# Patient Record
Sex: Female | Born: 1965 | Race: White | Hispanic: No | Marital: Married
Health system: Midwestern US, Community
[De-identification: ages and names within clinical notes are randomized; demographics above are authoritative.]

## PROBLEM LIST (undated history)

## (undated) DIAGNOSIS — G40219 Localization-related (focal) (partial) symptomatic epilepsy and epileptic syndromes with complex partial seizures, intractable, without status epilepticus: Secondary | ICD-10-CM

## (undated) DIAGNOSIS — L821 Other seborrheic keratosis: Secondary | ICD-10-CM

## (undated) DIAGNOSIS — Z1211 Encounter for screening for malignant neoplasm of colon: Secondary | ICD-10-CM

## (undated) DIAGNOSIS — N3090 Cystitis, unspecified without hematuria: Secondary | ICD-10-CM

## (undated) DIAGNOSIS — Z0000 Encounter for general adult medical examination without abnormal findings: Secondary | ICD-10-CM

## (undated) DIAGNOSIS — G40909 Epilepsy, unspecified, not intractable, without status epilepticus: Secondary | ICD-10-CM

## (undated) DIAGNOSIS — D649 Anemia, unspecified: Secondary | ICD-10-CM

## (undated) DIAGNOSIS — Z79899 Other long term (current) drug therapy: Secondary | ICD-10-CM

## (undated) DIAGNOSIS — M5117 Intervertebral disc disorders with radiculopathy, lumbosacral region: Secondary | ICD-10-CM

## (undated) DIAGNOSIS — E119 Type 2 diabetes mellitus without complications: Secondary | ICD-10-CM

## (undated) DIAGNOSIS — I10 Essential (primary) hypertension: Secondary | ICD-10-CM

## (undated) HISTORY — PX: FRACTURE SURGERY: SHX138

---

## 1998-11-15 ENCOUNTER — Ambulatory Visit (HOSPITAL_BASED_OUTPATIENT_CLINIC_OR_DEPARTMENT_OTHER): Admission: RE | Admit: 1998-11-15 | Discharge: 1998-11-15 | Payer: Self-pay | Admitting: Ophthalmology

## 1999-09-12 ENCOUNTER — Emergency Department (HOSPITAL_COMMUNITY): Admission: EM | Admit: 1999-09-12 | Discharge: 1999-09-12 | Payer: Self-pay | Admitting: Emergency Medicine

## 1999-10-18 ENCOUNTER — Encounter: Admission: RE | Admit: 1999-10-18 | Discharge: 1999-10-18 | Payer: Self-pay | Admitting: Obstetrics & Gynecology

## 2001-03-18 ENCOUNTER — Encounter: Payer: Self-pay | Admitting: Emergency Medicine

## 2001-03-18 ENCOUNTER — Emergency Department (HOSPITAL_COMMUNITY): Admission: EM | Admit: 2001-03-18 | Discharge: 2001-03-18 | Payer: Self-pay | Admitting: Emergency Medicine

## 2004-01-28 ENCOUNTER — Other Ambulatory Visit: Admission: RE | Admit: 2004-01-28 | Discharge: 2004-01-28 | Payer: Self-pay | Admitting: Obstetrics and Gynecology

## 2004-01-29 ENCOUNTER — Encounter: Admission: RE | Admit: 2004-01-29 | Discharge: 2004-01-29 | Payer: Self-pay | Admitting: Obstetrics and Gynecology

## 2004-02-05 ENCOUNTER — Inpatient Hospital Stay (HOSPITAL_COMMUNITY): Admission: AD | Admit: 2004-02-05 | Discharge: 2004-02-05 | Payer: Self-pay | Admitting: Obstetrics and Gynecology

## 2004-07-02 ENCOUNTER — Encounter: Admission: RE | Admit: 2004-07-02 | Discharge: 2004-07-02 | Payer: Self-pay | Admitting: Orthopaedic Surgery

## 2009-09-03 ENCOUNTER — Other Ambulatory Visit: Admission: RE | Admit: 2009-09-03 | Discharge: 2009-09-03 | Payer: Self-pay | Admitting: Family Medicine

## 2010-06-26 ENCOUNTER — Encounter: Payer: Self-pay | Admitting: Family Medicine

## 2011-11-23 NOTE — Progress Notes (Signed)
HISTORY OF PRESENT ILLNESS  Natasha Herman is a 46 y.o. female with a history of epilepsy that presents for establishment of care.     HPI  Natasha Herman is a 46 year old female that presents for establishment of care. The patient has a history of epilepsy, diagnosed in 2009. The patient did not have any acute complaints.     The patient seizures are currently managed by a Neurologist in [place], [place]. The patient states that within the next six months she would like to be referred to a neurologist within the [place], [place] area. The patient was given Lexapro because she was having symptoms of depression when diagnosed with epilepsy. Today the patient states she does not have any feelings of sadness and depression. The patient stats that she feels that the medication is working well. Pt is currently denies any homicidal or suicidal ideations, gestures, or plans.      The patient states that she has not had complete lab work for the last four years. The patient is up-to-date on her Pap Smears and Mammograms.     Patient does states that she has not had a period for the last 8 months and has noted some symptoms of hot flashes and night sweats. The patient is currently tolerating them well     Review of Systems   Constitutional: Positive for malaise/fatigue. Negative for fever, chills, weight loss and diaphoresis.        Fatigue associated post-seizure.    HENT: Positive for tinnitus. Negative for hearing loss, ear pain, congestion, sore throat and neck pain.         Tinnitis associated to prior to onset of seizure   Eyes: Negative for blurred vision, double vision, photophobia and pain.   Respiratory: Negative for cough, sputum production, shortness of breath and wheezing.    Cardiovascular: Negative for chest pain, palpitations and orthopnea.   Gastrointestinal: Negative for heartburn, nausea, vomiting, abdominal pain, diarrhea, constipation, blood in stool and melena.   Genitourinary: Negative for dysuria and urgency.    Musculoskeletal: Negative for myalgias, back pain and joint pain.   Skin: Negative for rash.   Neurological: Positive for dizziness and seizures. Negative for tingling, tremors, sensory change and headaches.   Endo/Heme/Allergies: Positive for environmental allergies. Bruises/bleeds easily.        Easy bleeding due to Aspirin   Psychiatric/Behavioral: Negative for depression. The patient is not nervous/anxious.      Past Medical History   Diagnosis Date   ??? Seizures    ??? Depression      Past Surgical History   Procedure Date   ??? Pr breast surgery procedure unlisted      lumpectomy-2003   ??? Hx heent      wisdom teeth -1995   ??? Hx cyst removal      R. Bartolin Cyst-2001     Family History   Problem Relation Age of Onset   ??? Cancer Mother      Breast   ??? Diabetes Mother    ??? Diabetes Sister    ??? Hypertension Sister    ??? Hypertension Mother    ??? Diabetes Maternal Grandmother    ??? Parkinsonism Father      History     Social History   ??? Marital Status: MARRIED     Spouse Name: N/A     Number of Children: 1   ??? 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 Smoking status: Never Smoker    ??? Smokeless tobacco: Never Used   ??? Alcohol Use: No   ??? Drug Use: No   ??? Sexually Active: No     Other Topics Concern   ??? Not on file     Social History Narrative   ??? No narrative on file     Allergies   Allergen Reactions   ??? Dilantin (Phenytoin Sodium Extended) Rash     No current outpatient prescriptions on file prior to visit.       Filed Vitals:    11/22/11 1312   BP: 110/70   Pulse: 70   Temp: 97.4 ??F (36.3 ??C)   Resp: 14   SpO2: 99%   .  There is no height or weight on file to calculate BMI.        Physical Exam   Vitals reviewed.  Constitutional: She is oriented to person, place, and time. Vital signs are normal. She appears well-developed and well-nourished. She is cooperative.   HENT:   Head: Normocephalic and atraumatic.   Right Ear: Hearing, tympanic membrane, external ear and  ear canal normal.   Left Ear: Hearing, tympanic membrane, external ear and ear canal normal.   Nose: Right sinus exhibits no maxillary sinus tenderness and no frontal sinus tenderness. Left sinus exhibits no maxillary sinus tenderness and no frontal sinus tenderness.   Mouth/Throat: Uvula is midline, oropharynx is clear and moist and mucous membranes are normal.   Eyes: Conjunctivae, EOM and lids are normal. Pupils are equal, round, and reactive to light.   Neck: Trachea normal. Neck supple. No thyromegaly present.   Cardiovascular: Normal rate, regular rhythm, S1 normal, S2 normal and normal heart sounds.    Pulmonary/Chest: Effort normal and breath sounds normal.   Abdominal: Soft. Normal appearance and bowel sounds are normal.   Musculoskeletal: Normal range of motion.   Lymphadenopathy:     She has no cervical adenopathy.   Neurological: She is alert and oriented to person, place, and time. She has normal strength and normal reflexes. No cranial nerve deficit.   Skin: Skin is warm and intact. Bruising noted. Nails show no clubbing.   Psychiatric: She has a normal mood and affect. Her speech is normal and behavior is normal. Thought content normal.       ASSESSMENT and PLAN      1. Epilepsy  Lamotrigine 100 mg TR24, levETIRAcetam (KEPPRA) 500 mg XR tablet, VIMPAT 150 mg Tab tablet, escitalopram (LEXAPRO) 10 mg tablet, CBC WITH AUTOMATED DIFF, METABOLIC PANEL, COMPREHENSIVE, LIPID PANEL, TSH, 3RD GENERATION   2. Depression  Continue current therapy      Encounter Diagnoses   Name Primary?   ??? Epilepsy Yes   ??? Depression      Follow-up Disposition: Will return for fasting labs.   reviewed diet and exercise      I have discussed the above plan with the patient. Patient has also been given instructions and information on her conditions.  Patient is aware of all possible side effects and agrees with the above mentioned plan.       Donnella Bi, DO

## 2011-11-24 LAB — METABOLIC PANEL, COMPREHENSIVE
A-G Ratio: 1.5 (ref 1.2–2.2)
ALT (SGPT): 17 U/L (ref 12–78)
AST (SGOT): 14 U/L — ABNORMAL LOW (ref 15–37)
Albumin: 4.5 g/dL (ref 3.4–5.0)
Alk. phosphatase: 86 U/L (ref 50–136)
Anion gap: 9 mmol/L (ref 6–15)
BUN/Creatinine ratio: 30 — ABNORMAL HIGH (ref 7–25)
BUN: 18 MG/DL (ref 7–18)
Bilirubin, total: 0.5 MG/DL (ref <1.1)
CO2: 31 MMOL/L (ref 21–32)
Calcium: 9.4 MG/DL (ref 8.5–10.1)
Chloride: 102 MMOL/L (ref 98–107)
Creatinine: 0.6 MG/DL (ref 0.60–1.30)
GFR est AA: >60 mL/min/{1.73_m2} (ref >60)
GFR est non-AA: >60 mL/min/{1.73_m2} (ref >60)
Globulin: 3 g/dL (ref 2.4–3.5)
Glucose: 84 MG/DL (ref 70–110)
Potassium: 4 MMOL/L (ref 3.5–5.3)
Protein, total: 7.5 g/dL (ref 6.4–8.2)
Sodium: 142 MMOL/L (ref 136–145)

## 2011-11-24 LAB — CBC WITH AUTOMATED DIFF
ABS. BASOPHILS: 0 10*3/uL (ref 0.0–0.1)
ABS. EOSINOPHILS: 0.1 10*3/uL (ref 0.0–0.5)
ABS. LYMPHOCYTES: 1.9 10*3/uL (ref 0.8–3.5)
ABS. MONOCYTES: 0.3 10*3/uL — ABNORMAL LOW (ref 0.8–3.5)
ABS. NEUTROPHILS: 2.2 10*3/uL (ref 1.5–8.0)
BASOPHILS: 1 % (ref 0–2)
EOSINOPHILS: 2 % (ref 0–5)
HCT: 38.8 % — ABNORMAL LOW (ref 41–53)
HGB: 13 g/dL (ref 12.0–16.0)
LYMPHOCYTES: 42 % (ref 19–48)
MCH: 29 PG (ref 27–31)
MCHC: 33.5 g/dL (ref 31–37)
MCV: 86.6 FL (ref 80–100)
MONOCYTES: 7 % (ref 3–9)
MPV: 10.6 FL — ABNORMAL HIGH (ref 5.9–10.3)
NEUTROPHILS: 48 % (ref 40–74)
PLATELET: 192 10*3/uL (ref 130–400)
RBC: 4.48 M/uL (ref 4.2–5.4)
RDW: 12.3 % (ref 11.5–14.5)
WBC: 4.5 10*3/uL (ref 4.5–10.8)

## 2011-11-24 LAB — LIPID PANEL
Cholesterol, total: 166 MG/DL (ref <200)
HDL Cholesterol: 76 MG/DL (ref 32–96)
LDL, calculated: 79.28 MG/DL (ref <130)
Triglyceride: 67 MG/DL (ref <150)
VLDL, calculated: 13.4 MG/DL (ref 5–32)

## 2011-11-24 LAB — TSH 3RD GENERATION: TSH: 1.72 u[IU]/mL (ref 0.35–3.74)

## 2012-01-15 ENCOUNTER — Other Ambulatory Visit: Payer: Self-pay | Admitting: Internal Medicine

## 2012-01-15 DIAGNOSIS — Z1231 Encounter for screening mammogram for malignant neoplasm of breast: Secondary | ICD-10-CM

## 2012-01-29 ENCOUNTER — Ambulatory Visit
Admission: RE | Admit: 2012-01-29 | Discharge: 2012-01-29 | Disposition: A | Discharge status: Home / Self Care | Payer: Private Health Insurance - Indemnity | Source: Ambulatory Visit | Attending: Internal Medicine | Admitting: Internal Medicine

## 2012-01-29 DIAGNOSIS — Z1231 Encounter for screening mammogram for malignant neoplasm of breast: Secondary | ICD-10-CM

## 2013-06-01 ENCOUNTER — Encounter (HOSPITAL_COMMUNITY): Payer: Self-pay | Admitting: Emergency Medicine

## 2013-06-01 ENCOUNTER — Emergency Department (INDEPENDENT_AMBULATORY_CARE_PROVIDER_SITE_OTHER)
Admission: EM | Admit: 2013-06-01 | Discharge: 2013-06-01 | Disposition: A | Discharge status: Home / Self Care | Payer: 59 | Source: Home / Self Care | Attending: Family Medicine | Admitting: Family Medicine

## 2013-06-01 DIAGNOSIS — J40 Bronchitis, not specified as acute or chronic: Secondary | ICD-10-CM

## 2013-06-01 DIAGNOSIS — F17200 Nicotine dependence, unspecified, uncomplicated: Secondary | ICD-10-CM

## 2013-06-01 DIAGNOSIS — IMO0001 Reserved for inherently not codable concepts without codable children: Secondary | ICD-10-CM

## 2013-06-01 MED ORDER — ALBUTEROL SULFATE HFA 108 (90 BASE) MCG/ACT IN AERS: 2.0000 | INHALATION_SPRAY | Freq: Four times a day (QID) | RESPIRATORY_TRACT | Status: DC | PRN

## 2013-06-01 MED ORDER — IPRATROPIUM BROMIDE 0.06 % NA SOLN: 2.0000 | Freq: Four times a day (QID) | NASAL | Status: DC

## 2013-06-01 MED ORDER — PREDNISONE 50 MG PO TABS: 50.0000 mg | ORAL_TABLET | Freq: Every day | ORAL | Status: DC

## 2013-06-01 MED ORDER — IPRATROPIUM BROMIDE 0.02 % IN SOLN
0.5000 mg | Freq: Once | RESPIRATORY_TRACT | Status: AC
Start: 2013-06-01 — End: 2013-06-01
  Administered 2013-06-01: 0.5 mg via RESPIRATORY_TRACT

## 2013-06-01 MED ORDER — SODIUM CHLORIDE 0.9 % IJ SOLN
INTRAMUSCULAR | Status: AC
  Filled 2013-06-01: qty 3

## 2013-06-01 MED ORDER — ALBUTEROL SULFATE (5 MG/ML) 0.5% IN NEBU
5.0000 mg | INHALATION_SOLUTION | Freq: Once | RESPIRATORY_TRACT | Status: AC
  Administered 2013-06-01: 5 mg via RESPIRATORY_TRACT

## 2013-06-01 MED ORDER — GUAIFENESIN-CODEINE 100-10 MG/5ML PO SOLN: 5.0000 mL | Freq: Every evening | ORAL | Status: DC | PRN

## 2013-06-01 MED ORDER — ALBUTEROL SULFATE (5 MG/ML) 0.5% IN NEBU
INHALATION_SOLUTION | RESPIRATORY_TRACT | Status: AC
  Filled 2013-06-01: qty 1

## 2013-06-01 MED ORDER — IPRATROPIUM BROMIDE 0.02 % IN SOLN
RESPIRATORY_TRACT | Status: AC
  Filled 2013-06-01: qty 2.5

## 2013-06-01 NOTE — ED Provider Notes (Signed)
Judith Pollard is a 47 y.o. female who presents to Urgent Care today for 2-3 weeks of cough productive of clear sputum runny nose subjective shortness of breath and fatigue. The patient denies any nausea vomiting or diarrhea. She's tried multiple over-the-counter medications which have not helped much. She does smoke but has never been diagnosed with COPD. She feels well otherwise.   History reviewed. No pertinent past medical history. History  Substance Use Topics  . Smoking status: Not on file  . Smokeless tobacco: Not on file  . Alcohol Use: Not on file   ROS as above Medications reviewed. Current Facility-Administered Medications  Medication Dose Route Frequency Provider Last Rate Last Dose  . ipratropium (ATROVENT) nebulizer solution 0.5 mg  0.5 mg Nebulization Once Rodolph Bong, MD       And  . albuterol (PROVENTIL) (5 MG/ML) 0.5% nebulizer solution 5 mg  5 mg Nebulization Once Rodolph Bong, MD       No current outpatient prescriptions on file.    Exam:  BP 160/78  Pulse 87  Temp(Src) 98.3 F (36.8 C) (Oral)  Resp 19  SpO2 99%  LMP 05/19/2013 Gen: Well NAD HEENT: EOMI,  MMM Lungs: Normal work of breathing. Coarse breath sounds bilaterally. Lung exam improved following nebulizer treatment. Heart: RRR no MRG Abd: NABS, Soft. NT, ND Exts: Non edematous BL  LE, warm and well perfused.   Patient was given a DuoNeb nebulizer treatment and had considerable improvement in symptoms   Assessment and Plan: 47 y.o. female with bronchitis versus possible COPD exacerbation. Recommend smoking cessation. Patient will treat with albuterol inhaler, prednisone, Atrovent nasal spray, and codeine containing cough medication. Recommend followup with primary care provider for smoking cessation counseling. Discussed warning signs or symptoms. Please see discharge instructions. Patient expresses understanding.      Rodolph Bong, MD 06/01/13 1049

## 2013-06-01 NOTE — ED Notes (Signed)
C/o cold sx States she has cough, congestion, and sneezing otc medication taking but no relief.

## 2013-09-17 NOTE — Telephone Encounter (Signed)
Patient said she doesn't want to see Dr. Leilani Merl any longer and would like to see Dr. Manuella Ghazi in [facility]).  They need a referral for this.  Also want to know she needs to come in to see you and get blood work?

## 2013-09-19 NOTE — Telephone Encounter (Signed)
Dr. Feliberto Gottron receptionist said they would not take referral without a more recent office visit than 2 years ago. Notified pt.

## 2013-09-29 NOTE — Telephone Encounter (Signed)
Natasha Bers, DO (You)Natasha Audie Pinto, LPN  9 days ago   Please inform pt we tried to honor her request but as told before they will not accept a referral from a doctor that the patient has not seen in two years      Natasha Bers, DO (Routing comment)    [name], [place], DO (You)  10 days ago     Dr. Feliberto Gottron receptionist said they would not take referral without a more recent office visit than 2 years ago. Notified pt.     Natasha Bers, DO (You)Natasha Audie Pinto, LPN and Tiffany L Depriest, LPN  11 days ago   Call Dr. Renae Gloss office, ask if they are willing to accept a referral for a pt that I havent seen in almost 2 years. Let them know that Wife and husband want referral done ASAP and are not willing to wait until her follow-up visit (which is may 4) for me to place it    They states that they have fired their Neurologist at [facility] and want referral NOW.     Let them know that we tried to explain to them that referrals are placed after office visits and that they needed to follow-up with me first but they believed this was not appropriate for their care. (Routing comment)    [place] Pink, DO (You)  12 days ago   Scheduled her an apt for May 4, but wants you to go ahead and refer her to the seizure doc. (Routing comment)    Natasha Bers, DO (You)[name]  12 days ago   Needs appointment hasnt been seen since 2013 (Routing comment)    [place] Pink, DO (You)  12 days ago     Patient said she doesn't want to see Dr. Leilani Merl any longer and would like to see Dr. Manuella Ghazi in [facility] They need a referral for this. Also want to know she needs to come in to see you and get blood work?    [place], [phone] L Tackett  12 days ago

## 2013-10-06 LAB — METABOLIC PANEL, COMPREHENSIVE
A-G Ratio: 1.5 (ref 1.2–2.2)
ALT (SGPT): 19 U/L (ref 12–78)
AST (SGOT): 18 U/L (ref 15–37)
Albumin: 4.5 g/dL (ref 3.4–5.0)
Alk. phosphatase: 73 U/L (ref 45–117)
Anion gap: 6 mmol/L (ref 6–15)
BUN/Creatinine ratio: 39 — ABNORMAL HIGH (ref 7–25)
BUN: 27 MG/DL — ABNORMAL HIGH (ref 7–18)
Bilirubin, total: 0.3 MG/DL (ref <1.1)
CO2: 31 mmol/L (ref 21–32)
Calcium: 9.4 MG/DL (ref 8.5–10.1)
Chloride: 105 mmol/L (ref 98–107)
Creatinine: 0.7 MG/DL (ref 0.60–1.30)
GFR est AA: >60 mL/min/{1.73_m2} (ref >60)
GFR est non-AA: >60 mL/min/{1.73_m2} (ref >60)
Globulin: 3.1 g/dL (ref 2.4–3.5)
Glucose: 79 mg/dL (ref 70–110)
Potassium: 4.8 mmol/L (ref 3.5–5.3)
Protein, total: 7.6 g/dL (ref 6.4–8.2)
Sodium: 142 mmol/L (ref 136–145)

## 2013-10-06 LAB — CBC WITH AUTOMATED DIFF
ABS. BASOPHILS: 0 10*3/uL (ref 0.0–0.1)
ABS. EOSINOPHILS: 0.1 10*3/uL (ref 0.0–0.5)
ABS. LYMPHOCYTES: 1.8 10*3/uL (ref 0.8–3.5)
ABS. MONOCYTES: 0.4 10*3/uL — ABNORMAL LOW (ref 0.8–3.5)
ABS. NEUTROPHILS: 3.3 10*3/uL (ref 1.5–8.0)
BASOPHILS: 1 % (ref 0–2)
EOSINOPHILS: 1 % (ref 0–5)
HCT: 37.1 % — ABNORMAL LOW (ref 41–53)
HGB: 12.3 g/dL (ref 12.0–16.0)
LYMPHOCYTES: 32 % (ref 19–48)
MCH: 28.7 PG (ref 27–31)
MCHC: 33.2 g/dL (ref 31–37)
MCV: 86.5 FL (ref 80–100)
MONOCYTES: 8 % (ref 3–9)
MPV: 10.8 FL — ABNORMAL HIGH (ref 5.9–10.3)
NEUTROPHILS: 58 % (ref 40–74)
PLATELET: 218 10*3/uL (ref 130–400)
RBC: 4.29 M/uL (ref 4.2–5.4)
RDW: 12.7 % (ref 11.5–14.5)
WBC: 5.6 10*3/uL (ref 4.5–10.8)

## 2013-10-06 LAB — TSH 3RD GENERATION: TSH: 1.9 u[IU]/mL (ref 0.35–3.74)

## 2013-10-06 NOTE — Patient Instructions (Signed)
[facility]   Office working hours are Monday - Friday 8:00 am to 4:30 pm.   Saturday 9 am to 12 pm for acute illnesses only for established patients.   Office phone number is [phone] and fax is [phone].   For non-emergent medical care and clinical advice during office hours:   1. Call office or   2. Send message or request using MyChart   For non-emergent medical care and clinical advice after office hours:   1. Call [phone] or   2. Send message or request using MyChart   Emergency care can be obtained at the [facility] ER, Urgent Care or calling 911.   Patient Satisfaction Survey   We appreciate you giving us your e-mail address. Please watch for our patient satisfaction survey which you will receive by e-mail. We strive to provide you with the best care possible. We respect all comments and will take comments into consideration to improve our service.   Thank you for your participation.

## 2013-10-06 NOTE — Progress Notes (Signed)
HISTORY OF PRESENT ILLNESS  Natasha Herman is a 48 y.o. female with a history of epilepsy that presents for establishment of care.     HPI  Natasha Herman is a 48 year old female that presents to follow-up care.  Need referral to new neurologist. DX with Seizure disorder in 2009.  Pt states that since L Temporal craniotomy in March 18 2013.  Pt states that since the surgery which was was managed by a Neurologist in [place], [place], seizure activity has doubled. Went from 17 seizures a month to now up to 33 seizures a months. Neurologist is treating her seizure disorder and primary mood disorder. Pt denies any tonic clonic movement, describes it as a disorientation with a post ictal state (very difficult historian as what exactly is involved when she has a seizure)  Pt states that seizures last 1-3 minutes with postictal state being about 30 minutes after the episode. Denies loss of bowel or bladder. Tinnitis  associated to prior to onset of seizure     Review of Systems   Constitutional: Positive for malaise/fatigue. Negative for fever, chills, weight loss and diaphoresis.        Fatigue associated post-seizure.    HENT: Positive for tinnitus. Negative for hearing loss, ear pain, congestion, sore throat and neck pain.         Tinnitis associated to prior to onset of seizure   Eyes: Negative for blurred vision, double vision, photophobia and pain.   Respiratory: Negative for cough, sputum production, shortness of breath and wheezing.    Cardiovascular: Negative for chest pain, palpitations and orthopnea.   Gastrointestinal: Negative for heartburn, nausea, vomiting, abdominal pain, diarrhea, constipation, blood in stool and melena.   Genitourinary: Negative for dysuria and urgency.   Musculoskeletal: Negative for myalgias, back pain and joint pain.   Skin: Negative for rash.   Neurological: Positive for  seizures. Negative for tingling, tremors, sensory change and headaches.   Endo/Heme/Allergies: Positive for  environmental allergies. Bruises/bleeds easily.        Easy bleeding due to Aspirin   Psychiatric/Behavioral: Negative for depression. The patient is not nervous/anxious.      Past Medical History   Diagnosis Date   ??? Seizures ([place])    ??? Depression      Past Surgical History   Procedure Laterality Date   ??? Pr breast surgery procedure unlisted       lumpectomy-2003   ??? Hx heent       wisdom teeth -1995   ??? Hx cyst removal       R. Bartolin Cyst-2001   ??? Hx craniotomy       2014 -- L temperol lobe      Family History   Problem Relation Age of Onset   ??? Cancer Mother      Breast   ??? Diabetes Mother    ??? Diabetes Sister    ??? Hypertension Sister    ??? Hypertension Mother    ??? Diabetes Maternal Grandmother    ??? Parkinsonism Father      History     Social History   ??? Marital Status: MARRIED     Spouse Name: N/A     Number of Children: 1   ??? 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 Smoking status: Never Smoker    ??? Smokeless tobacco: Never Used   ??? Alcohol  Use: No   ??? Drug Use: No   ??? Sexual Activity: No     Other Topics Concern   ??? Not on file     Social History Narrative     Allergies   Allergen Reactions   ??? Aptiom [Eslicarbazepine] Seizures   ??? Dilantin [Phenytoin Sodium Extended] Rash   ??? Fycompa [Perampanel] Other (comments)     PATIENT STATES SHE PASSED OUT     Current Outpatient Prescriptions on File Prior to Visit   Medication Sig Dispense Refill   ??? Lamotrigine 100 mg TR24 100 mg.       ??? levETIRAcetam (KEPPRA) 500 mg XR tablet 1,500 mg two (2) times a day.       ??? VIMPAT 150 mg Tab tablet 150 mg two (2) times a day.       ??? escitalopram (LEXAPRO) 10 mg tablet 10 mg.         No current facility-administered medications on file prior to visit.       Filed Vitals:    10/06/13 1150   BP: 112/64   Pulse: 79   Temp: 97.9 ??F (36.6 ??C)   TempSrc: Temporal   Resp: 18   Height: 6\' 1"  (1.854 m)   Weight: 136 lb (61.689 kg)   SpO2: 98%   .  Body mass index is 17.95  kg/(m^2).        Physical Exam   Vitals reviewed.  Constitutional: She is oriented to person, place, and time. Vital signs are normal. She appears well-developed and well-nourished. She is cooperative.   HENT:   Head: Normocephalic and atraumatic.   Right Ear: Hearing, tympanic membrane, external ear and ear canal normal.   Left Ear: Hearing, tympanic membrane, external ear and ear canal normal.   Nose: Right sinus exhibits no maxillary sinus tenderness and no frontal sinus tenderness. Left sinus exhibits no maxillary sinus tenderness and no frontal sinus tenderness.   Mouth/Throat: Uvula is midline, oropharynx is clear and moist and mucous membranes are normal.   Eyes: Conjunctivae, EOM and lids are normal. Pupils are equal, round, and reactive to light.   Neck: Trachea normal. Neck supple. No thyromegaly present.   Cardiovascular: Normal rate, regular rhythm, S1 normal, S2 normal and normal heart sounds.    Pulmonary/Chest: Effort normal and breath sounds normal.   Abdominal: Soft. Normal appearance and bowel sounds are normal.   Musculoskeletal: Normal range of motion.   Lymphadenopathy:     She has no cervical adenopathy.   Neurological: She is alert and oriented to person, place, and time. She has normal strength and normal reflexes. No cranial nerve deficit.   Skin: Skin is warm and intact.  Nails show no clubbing.   Psychiatric: She has a normal mood and affect. Her speech is normal and behavior is normal. Thought content normal.       ASSESSMENT and PLAN      ICD-9-CM    1. Epilepsy without status epilepticus, not intractable (HCC) 345.90 REFERRAL TO NEUROLOGY     CBC WITH AUTOMATED DIFF     METABOLIC PANEL, COMPREHENSIVE     TSH, 3RD GENERATION     LEVETIRACETAM (KEPPRA)   2. Mood disorder (HCC) 296.90 REFERRAL TO NEUROLOGY     CBC WITH AUTOMATED DIFF     METABOLIC PANEL, COMPREHENSIVE     TSH, 3RD GENERATION     LEVETIRACETAM (KEPPRA)       Follow-up Disposition: 3 months   reviewed diet and exercise  I have discussed the above plan with the patient. Patient has also been given instructions and information on her conditions.  Patient is aware of all possible side effects and agrees with the above mentioned plan.       Hermenia Bers, DO

## 2013-10-06 NOTE — Progress Notes (Signed)
Labs were collected @ 100 pm per Dr. Sabra Heck,   1-purple tube  1-green tube  1-yellow tube  1-red tube

## 2013-10-08 LAB — LEVETIRACETAM (KEPPRA): Levetiracetam (Keppra): 65 ug/mL — ABNORMAL HIGH (ref 5.0–63.0)

## 2013-10-08 NOTE — Progress Notes (Signed)
Quick Note:    Done.  ______

## 2013-10-08 NOTE — Progress Notes (Signed)
Quick Note:    Send results of labs with my note with referral to neurology  ______

## 2013-12-13 ENCOUNTER — Encounter (HOSPITAL_COMMUNITY): Payer: Self-pay | Admitting: Emergency Medicine

## 2013-12-13 ENCOUNTER — Emergency Department (HOSPITAL_COMMUNITY)
Admission: EM | Admit: 2013-12-13 | Discharge: 2013-12-13 | Disposition: A | Discharge status: Home / Self Care | Payer: Self-pay | Source: Home / Self Care | Attending: Emergency Medicine | Admitting: Emergency Medicine

## 2013-12-13 DIAGNOSIS — L989 Disorder of the skin and subcutaneous tissue, unspecified: Secondary | ICD-10-CM

## 2013-12-13 NOTE — ED Provider Notes (Signed)
CSN: [id number]     Arrival date & time 12/13/13  0902 History   First MD Initiated Contact with Patient 12/13/13 [phone]     Chief Complaint  Patient presents with  . Skin Problem   (Consider location/radiation/quality/duration/timing/severity/associated sxs/prior Treatment) HPI Comments: Patient presents to clinic with reports that a mole or skin tag that has been present at her left lateral neck for quite some time, but patient states that this lesion has had noticeable increase in size over the past few weeks. 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   The history is provided by the patient.    History reviewed. No pertinent past medical history. History reviewed. No pertinent past surgical history. History reviewed. No pertinent family history. History  Substance Use Topics  . Smoking status: Not on file  . Smokeless tobacco: Not on file  . Alcohol Use: No   OB History   Grav Para Term Preterm Abortions TAB SAB Ect Mult Living                 Review of Systems  All other systems reviewed and are negative.   Allergies  Review of patient's allergies indicates no known allergies.  Home Medications   Prior to Admission medications   Medication Sig Start Date End Date Taking? Authorizing Provider  albuterol (PROVENTIL HFA;VENTOLIN HFA) 108 (90 BASE) MCG/ACT inhaler Inhale 2 puffs into the lungs every 6 (six) hours as needed for wheezing or shortness of breath. 06/01/13   Rodolph BongEvan S Corey, MD  guaiFENesin-codeine 100-10 MG/5ML syrup Take 5 mLs by mouth at bedtime as needed for cough. 06/01/13   Rodolph BongEvan S Corey, MD  ipratropium (ATROVENT) 0.06 % nasal spray Place 2 sprays into both nostrils 4 (four) times daily. 06/01/13   Rodolph BongEvan S Corey, MD  predniSONE (DELTASONE) 50 MG tablet Take 1 tablet (50 mg total) by mouth daily. 06/01/13   Rodolph BongEvan S Corey, MD   BP 135/90  Pulse 87  Temp(Src) 97.6 F (36.4 C) (Oral)  Resp 16   SpO2 96% Physical Exam  Constitutional: She is oriented to person, place, and time. She appears well-developed and well-nourished.  HENT:  Head: Normocephalic and atraumatic.  Eyes: Conjunctivae are normal.  Neck: Normal range of motion. Neck supple.  Cardiovascular: Normal rate.   Pulmonary/Chest: Effort normal.  Musculoskeletal: Normal range of motion.  Neurological: She is alert and oriented to person, place, and time.  Skin: Skin is warm and dry.  1 cm skin colored erythematous polypoid lesion with glossy surface at left lateral neck  Psychiatric: She has a normal mood and affect. Her behavior is normal.    ED Course  Procedures (including critical care time) Labs Review Labs Reviewed - No data to display  Imaging Review No results found.   MDM   1. Skin lesion    Will refer to dermatology for removal.     Ardis RowanJennifer Lee Duward Allbritton, PA 12/13/13 0945

## 2013-12-13 NOTE — ED Notes (Signed)
Pt  Has  A  Large  Red  Mole  l  Side  Of  Neck  Which  She  Has  Had  For  A  Long time    But  It  Has  Grown and  Changed  Over  The  Last  Week

## 2013-12-13 NOTE — Discharge Instructions (Signed)
Please choose from one of the dermatologists listed on your discharge paperwork for follow up evaluation to have skin lesion removed.

## 2013-12-15 NOTE — ED Provider Notes (Signed)
Medical screening examination/treatment/procedure(s) were performed by non-physician practitioner and as supervising physician I was immediately available for consultation/collaboration.  Leslee Homeavid Cope Marte, M.D.  Reuben Likesavid C Andria Head, MD 12/15/13 [phone]

## 2014-03-18 ENCOUNTER — Telehealth

## 2014-03-18 NOTE — Telephone Encounter (Signed)
Patient called and made appt for a physical and to update meds on nov 17th but is also asking to have orders for any blood work you may want done be placed so she can have those prior to visit

## 2014-03-18 NOTE — Telephone Encounter (Signed)
Done

## 2014-04-14 ENCOUNTER — Inpatient Hospital Stay: Admit: 2014-04-14 | Payer: BLUE CROSS/BLUE SHIELD | Primary: Family Medicine

## 2014-04-14 ENCOUNTER — Encounter

## 2014-04-14 DIAGNOSIS — Z13220 Encounter for screening for lipoid disorders: Secondary | ICD-10-CM

## 2014-04-14 LAB — TSH 3RD GENERATION: TSH: 1.82 u[IU]/mL (ref 0.35–3.74)

## 2014-04-14 LAB — LIPID PANEL
Cholesterol, total: 158 MG/DL (ref <200)
HDL Cholesterol: 76 MG/DL (ref 32–96)
LDL, calculated: 71.44 MG/DL (ref <130)
Triglyceride: 66 MG/DL (ref <150)
VLDL, calculated: 13.2 MG/DL (ref 5–32)

## 2014-04-14 LAB — METABOLIC PANEL, COMPREHENSIVE
A-G Ratio: 1.4 (ref 1.2–2.2)
ALT (SGPT): 24 U/L (ref 12–78)
AST (SGOT): 15 U/L (ref 15–37)
Albumin: 4.3 g/dL (ref 3.4–5.0)
Alk. phosphatase: 69 U/L (ref 45–117)
Anion gap: 7 mmol/L (ref 6–15)
BUN/Creatinine ratio: 29 — ABNORMAL HIGH (ref 7–25)
BUN: 20 MG/DL — ABNORMAL HIGH (ref 7–18)
Bilirubin, total: 0.4 MG/DL (ref <1.1)
CO2: 31 mmol/L (ref 21–32)
Calcium: 9 MG/DL (ref 8.5–10.1)
Chloride: 107 mmol/L (ref 98–107)
Creatinine: 0.7 MG/DL (ref 0.60–1.30)
GFR est AA: >60 mL/min/{1.73_m2} (ref >60)
GFR est non-AA: >60 mL/min/{1.73_m2} (ref >60)
Globulin: 3 g/dL (ref 2.4–3.5)
Glucose: 89 mg/dL (ref 70–110)
Potassium: 4.6 mmol/L (ref 3.5–5.3)
Protein, total: 7.3 g/dL (ref 6.4–8.2)
Sodium: 145 mmol/L (ref 136–145)

## 2014-04-14 LAB — CBC WITH AUTOMATED DIFF
ABS. BASOPHILS: 0.1 10*3/uL (ref 0.0–0.1)
ABS. EOSINOPHILS: 0.1 10*3/uL (ref 0.0–0.5)
ABS. LYMPHOCYTES: 1.8 10*3/uL (ref 0.8–3.5)
ABS. MONOCYTES: 0.3 10*3/uL — ABNORMAL LOW (ref 0.8–3.5)
ABS. NEUTROPHILS: 2.5 10*3/uL (ref 1.5–8.0)
BASOPHILS: 1 % (ref 0–2)
EOSINOPHILS: 1 % (ref 0–5)
HCT: 39.1 % — ABNORMAL LOW (ref 41–53)
HGB: 12.5 g/dL (ref 12.0–16.0)
LYMPHOCYTES: 39 % (ref 19–48)
MCH: 28.3 PG (ref 27–31)
MCHC: 32 g/dL (ref 31–37)
MCV: 88.7 FL (ref 80–100)
MONOCYTES: 6 % (ref 3–9)
MPV: 11 FL — ABNORMAL HIGH (ref 5.9–10.3)
NEUTROPHILS: 53 % (ref 40–74)
NRBC: 0 PER 100 WBC
PLATELET: 209 10*3/uL (ref 130–400)
RBC: 4.41 M/uL (ref 4.2–5.4)
RDW: 12.6 % (ref 11.5–14.5)
WBC: 4.7 10*3/uL (ref 4.5–10.8)

## 2014-04-14 LAB — HEMOGLOBIN A1C WITH EAG: Hemoglobin A1c: 5.5 % (ref 4.2–6.3)

## 2014-04-14 NOTE — Progress Notes (Signed)
Labs were collected @ 1105 am per Dr. Sabra Heck, patient was fasting  2-purple tubes  1-green tube  1-red tube

## 2014-04-16 LAB — LEVETIRACETAM (KEPPRA): Levetiracetam (Keppra): 41.8 ug/mL — ABNORMAL HIGH (ref 10.0–40.0)

## 2014-04-17 NOTE — Progress Notes (Signed)
Quick Note:        Done.    ______

## 2014-04-17 NOTE — Progress Notes (Signed)
Quick Note:        Send Keppra level and the rest of labs to neurologist    Hermenia Bers, DO        ______

## 2014-04-20 NOTE — Progress Notes (Signed)
Quick Note:        Done.    ______

## 2014-04-21 ENCOUNTER — Ambulatory Visit
Admit: 2014-04-21 | Discharge: 2014-04-21 | Payer: PRIVATE HEALTH INSURANCE | Attending: Family Medicine | Primary: Family Medicine

## 2014-04-21 DIAGNOSIS — F32A Depression, unspecified: Secondary | ICD-10-CM

## 2014-04-21 MED ORDER — ESCITALOPRAM 10 MG TAB
10 mg | ORAL_TABLET | Freq: Every day | ORAL | Status: DC
Start: 2014-04-21 — End: 2015-02-03

## 2014-04-21 NOTE — Progress Notes (Signed)
HISTORY OF PRESENT ILLNESS  [facility] Unnamed Natasha Herman is a 48 y.o. female.    has a past medical history of Seizures ([place]) and Depression.    Chief Complaint   Patient presents with   ??? Physical         HPI    Patient is a pleasant 48 yo WM with the above listed PMH who presents for evaluation of longstanding depression. Has been stable on this medication for several years. . She complains of no symptoms currently; occasionally has a "good cry about the frequency of my seizures" . Onset was approximately several years ago, controlled since that time.  She denies current suicidal and homicidal plan or intent. Possible organic causes contributing are: chronic health conditions.  . Current medication(s) includes Lexapro and no other therapies. This is not effective. She complains of the following side effects from the treatment: none.     She has a longstanding hx of Eliepsy- still having active sseizures- Office manager at [employer].   Reviewed lab work:     Lab Results   Component Value Date/Time    WBC 4.7 04/14/2014 11:05 AM    HGB 12.5 04/14/2014 11:05 AM    HCT 39.1 04/14/2014 11:05 AM    PLATELET 209 04/14/2014 11:05 AM    MCV 88.7 04/14/2014 11:05 AM     Lab Results   Component Value Date/Time    SODIUM 145 04/14/2014 11:05 AM    POTASSIUM 4.6 04/14/2014 11:05 AM    CHLORIDE 107 04/14/2014 11:05 AM    CO2 31 04/14/2014 11:05 AM    ANION GAP 7 04/14/2014 11:05 AM    GLUCOSE 89 04/14/2014 11:05 AM    BUN 20 04/14/2014 11:05 AM    CREATININE 0.70 04/14/2014 11:05 AM    BUN/CREATININE RATIO 29 04/14/2014 11:05 AM    GFR EST AA >60 04/14/2014 11:05 AM    GFR EST NON-AA >60 04/14/2014 11:05 AM    CALCIUM 9.0 04/14/2014 11:05 AM    BILIRUBIN, TOTAL 0.4 04/14/2014 11:05 AM    ALT 24 04/14/2014 11:05 AM    AST 15 04/14/2014 11:05 AM    ALK. PHOSPHATASE 69 04/14/2014 11:05 AM    PROTEIN, TOTAL 7.3 04/14/2014 11:05 AM    ALBUMIN 4.3 04/14/2014 11:05 AM    GLOBULIN 3.0 04/14/2014 11:05 AM    A-G RATIO 1.4 04/14/2014 11:05 AM      Lab Results   Component Value Date/Time    CHOLESTEROL, TOTAL 158 04/14/2014 11:05 AM    HDL CHOLESTEROL 76 04/14/2014 11:05 AM    LDL, CALCULATED 71.44 04/14/2014 11:05 AM    VLDL, CALCULATED 13.2 04/14/2014 11:05 AM    TRIGLYCERIDE 66 04/14/2014 11:05 AM     Lab Results   Component Value Date/Time    TSH 1.82 04/14/2014 11:05 AM     Lab Results   Component Value Date/Time    HEMOGLOBIN A1C 5.5 04/14/2014 11:05 AM       ROS  A thorough 10 point review of systems was done and was unremarkable except for HPI and chronic medical conditions.     Past Medical History   Diagnosis Date   ??? Seizures ([place])    ??? Depression      Past Surgical History   Procedure Laterality Date   ??? Pr breast surgery procedure unlisted       lumpectomy-2003   ??? Hx heent       wisdom teeth -1995   ??? Hx cyst removal  R. Bartolin Cyst-2001   ??? Hx craniotomy       2014 -- L temperol lobe      Family History   Problem Relation Age of Onset   ??? Cancer Mother      Breast   ??? Diabetes Mother    ??? Diabetes Sister    ??? Hypertension Sister    ??? Hypertension Mother    ??? Diabetes Maternal Grandmother    ??? Parkinsonism Father      History     Social History   ??? Marital Status: MARRIED     Spouse Name: N/A     Number of Children: 1   ??? 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 Smoking status: Never Smoker    ??? Smokeless tobacco: Never Used   ??? Alcohol Use: No   ??? Drug Use: No   ??? Sexual Activity: No     Other Topics Concern   ??? Not on file     Social History Narrative     Allergies   Allergen Reactions   ??? Aptiom [Eslicarbazepine] Seizures   ??? Dilantin [Phenytoin Sodium Extended] Rash   ??? Fycompa [Perampanel] Other (comments)     PATIENT STATES SHE PASSED OUT     Current Outpatient Prescriptions on File Prior to Visit   Medication Sig Dispense Refill   ??? Lamotrigine 100 mg TR24 100 mg.     ??? levETIRAcetam (KEPPRA) 500 mg XR tablet 1,500 mg two (2) times a day.      ??? VIMPAT 150 mg Tab tablet 150 mg two (2) times a day.       No current facility-administered medications on file prior to visit.       Filed Vitals:    04/21/14 1020   BP: 116/62   Pulse: 81   Temp: 98 ??F (36.7 ??C)   TempSrc: Temporal   Resp: 18   Height: $Remove'6\' 1"'VagBpkB$  (1.854 m)   Weight: 140 lb (63.504 kg)   SpO2: 99%   .  Body mass index is 18.47 kg/(m^2).          Physical Exam    General: NAD. A x O x 3     Skin: no evidence of rashes, scars, or moles    HEENT: Head: atraumatic normocephalic, Eyes:  PERLA, EOMI Ears: without discharge or pain of external exam Nares: patent Throat: with out erythema     Neck: no thyromegaly    Heart:  RRR, S1S2, no murmur, gallop, or rub    Lungs:   CTA bilaterally     Abdomen:  Soft, nontender, BS x 4    Extremities:  Strength 4/4, no cyanosis, no edema    ASSESSMENT and PLAN    ICD-10-CM ICD-9-CM    1. Depression F32.9 311 escitalopram oxalate (LEXAPRO) 10 mg tablet   2. Epilepsy with status epilepticus, not intractable ([place]) G40.901 345.90    Continue with current neurologist     Follow-up Disposition:  Return in about 3 months (around 07/22/2014).  very strongly urged to quit smoking to reduce cardiovascular risk  reviewed medications and side effects in detail  radiology results and schedule of future radiology studies reviewed with patient    Continue with neurologist    I have discussed the above plan with the patient. Patient has also been given instructions and information on her conditions.  Patient is  aware of all possible side effects and agrees with the above mentioned plan.       Hermenia Bers, DO

## 2014-06-08 ENCOUNTER — Encounter

## 2014-06-08 NOTE — Telephone Encounter (Signed)
You referred patient to neurologist in [place]. Patient says they want her to go to [facility] so she wants you to refer her to dr. Glee Arvin ??

## 2014-07-01 ENCOUNTER — Ambulatory Visit
Admit: 2014-07-01 | Discharge: 2014-07-01 | Payer: PRIVATE HEALTH INSURANCE | Attending: Neurology | Primary: Family Medicine

## 2014-07-01 DIAGNOSIS — G40219 Localization-related (focal) (partial) symptomatic epilepsy and epileptic syndromes with complex partial seizures, intractable, without status epilepticus: Secondary | ICD-10-CM

## 2014-07-01 MED ORDER — LACOSAMIDE 150 MG TAB
150 mg | ORAL_TABLET | Freq: Two times a day (BID) | ORAL | Status: DC
Start: 2014-07-01 — End: 2015-03-22

## 2014-07-01 MED ORDER — LAMOTRIGINE ER 100 MG 24 HR TAB
100 mg | ORAL_TABLET | Freq: Every day | ORAL | Status: DC
Start: 2014-07-01 — End: 2015-02-03

## 2014-07-01 MED ORDER — LACOSAMIDE 150 MG TAB
150 mg | ORAL_TABLET | Freq: Two times a day (BID) | ORAL | Status: DC
Start: 2014-07-01 — End: 2014-07-01

## 2014-07-01 MED ORDER — LEVETIRACETAM ER 500 MG 24 HR TAB
500 mg | ORAL_TABLET | Freq: Two times a day (BID) | ORAL | Status: DC
Start: 2014-07-01 — End: 2015-02-03

## 2014-07-01 NOTE — Progress Notes (Signed)
[name], MD   [address], [place] [address]  Phone:  [phone]  Fax:  [phone]      Patient ID  Name:  Natasha Herman  DOB:  1965/10/07  MRN:  [id number]  Age:  49 y.o.  PCP:  Hermenia Bers, DO    Subjective:     Encounter Date:  07/01/2014    Referring Physician: Hermenia Bers    Chief Complaint   Patient presents with   ??? Seizure     referred by Dr. Sabra Heck, pt has history of seizures, here to establish with new Neurologist        History of Present Illness:     49 year old female with seizures since 2009    She had brief 15 seconds of unresponsiveness  Followed by decreased in energy.    She had left temporal lobectomy few years back  Followed by change in seizure intensity to  Cold sensation on the top of the head moving down to epigastrium and spreading to bilateral arms(few seconds).  She has altered mentation before the episodes but immediately after the episode she is back to baseline  She has about one to two episodes every day but since she got new batch of medications she has decreased seizures.  Video EEG monitoring on the medication showed that cold sensation on the head was coming from the left temporal lobe.     Current Outpatient Prescriptions on File Prior to Visit   Medication Sig Dispense Refill   ??? escitalopram oxalate (LEXAPRO) 10 mg tablet Take 1 Tab by mouth daily. 90 Tab 3   ??? Lamotrigine 100 mg TR24 100 mg.     ??? levETIRAcetam (KEPPRA) 500 mg XR tablet 1,500 mg two (2) times a day.     ??? VIMPAT 150 mg Tab tablet 150 mg two (2) times a day.       No current facility-administered medications on file prior to visit.      Allergies   Allergen Reactions   ??? Aptiom [Eslicarbazepine] Seizures   ??? Dilantin [Phenytoin Sodium Extended] Rash   ??? Fycompa [Perampanel] Other (comments)     [place] OUT     Patient Active Problem List   Diagnosis Code   ??? Epilepsy without status epilepticus, not intractable ([place]) G40.909   ??? Depression F32.9      Past Medical History   Diagnosis Date   ??? Seizures ([place])    ??? Depression       Past Surgical History   Procedure Laterality Date   ??? Pr breast surgery procedure unlisted       lumpectomy-2003   ??? Hx heent       wisdom teeth -1995   ??? Hx cyst removal       R. Bartolin Cyst-2001   ??? Hx craniotomy       2014 -- L temperol lobe    ??? Hx carotid endarterectomy        Family History   Problem Relation Age of Onset   ??? Cancer Mother      Breast   ??? Diabetes Mother    ??? Diabetes Sister    ??? Hypertension Sister    ??? Hypertension Mother    ??? Diabetes Maternal Grandmother    ??? Parkinsonism Father       History     Social History   ??? Marital Status: MARRIED  Spouse Name: N/A     Number of Children: 1   ??? 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 Smoking status: Never Smoker    ??? Smokeless tobacco: Never Used   ??? Alcohol Use: No   ??? Drug Use: No   ??? Sexual Activity: No     Other Topics Concern   ??? Not on file     Social History Narrative       Review of Systems:  Review of Systems   Constitutional: Positive for malaise/fatigue.   HENT: Positive for hearing loss.    Eyes: Negative for blurred vision.   Respiratory: Negative for cough.    Cardiovascular: Negative for chest pain.   Gastrointestinal: Negative for heartburn.   Genitourinary: Negative for dysuria.   Musculoskeletal: Negative for myalgias.   Skin: Negative for rash.   Neurological: Positive for tingling and seizures.   Endo/Heme/Allergies: Does not bruise/bleed easily.   Psychiatric/Behavioral: Negative for depression.         Objective:     Filed Vitals:    07/01/14 1457   BP: 125/88   Pulse: 75   Height: 6\' 1"  (1.854 m)   Weight: 138 lb (62.596 kg)   PainSc:   0 - No pain       Physical Exam:  Neurologic Exam     Mental Status   Oriented to person, place, and time.     Cranial Nerves   Cranial nerves II through XII intact.     Motor Exam   Muscle bulk: normal  Overall muscle tone: normal        Moving all 4 extremities equally     Sensory Exam   Light touch normal.     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    Plan:     1) continue current medications  1. Partial symptomatic epilepsy with complex partial seizures, intractable, without status epilepticus (HCC)    - levETIRAcetam (KEPPRA XR) 500 mg ER tablet; Take 3 Tabs by mouth two (2) times a day. Indications: COMPLEX-PARTIAL EPILEPSY  Dispense: 540 Tab; Refill: 3  - lamoTRIgine (LAMICTAL XR) 100 mg tr24 ER tablet; Take 1 Tab by mouth daily. Indications: COMPLEX-PARTIAL EPILEPSY  Dispense: 90 Tab; Refill: 3  - lacosamide (VIMPAT) 150 mg tab tablet; Take 1 Tab by mouth two (2) times a day. Max Daily Amount: 300 mg.  Dispense: 180 Tab; Refill: 3  - LAMOTRIGINE (LAMICTAL); Future  - MISC. LAB TEST; Future    2) will also draw the levels of lamotrigine and lacosamide. As she got best seizure control on these medications.    Her history from the beginning will be scanned to our records.    I have spent more than half of 60 minutes face to face  in discussion about primary diagnosis,reviewing her previous history, side-effects of treatments, plan  of care, precautions, examining the pt.

## 2014-07-01 NOTE — Patient Instructions (Signed)
[facility]  Office working hours are Monday - Thursday 9:00 am to 5:00 pm.  Friday 8 am to 12 noon with limited staff.  Office phone number is [phone] and fax is [phone].  For non-emergent medical care and clinical advice during office hours:   1. Call office or   2.  Send message or request using MyChart  For non-emergent medical care and clinical advice after office hours:  1.  Call [phone] or  2.  Send message or request using MyChart  Emergency care can be obtained at the [facility] ER, Urgent Care or calling 911.    Patient Satisfaction Survey  We appreciate you giving us your e-mail address.  Please watch for our patient satisfaction survey which you will receive by e-mail.  We strive to provide you with the best care possible.  We respect all comments and will take comments into consideration to improve our service.  Thank you for your participation.  As a valued patient, you will be receiving a survey from [employer].  We encourage you to share your thoughts and opinions about the care you received today.  Thank you for choosing [facility].

## 2014-10-07 NOTE — Telephone Encounter (Addendum)
Patient called and stated she has an appt on May17th.  She was asking if labs could be done on that day because insurance will not cover cost unless labs are done on same day as appt.  Her last labs were done on 04/14/14.

## 2014-10-20 ENCOUNTER — Inpatient Hospital Stay: Admit: 2014-10-20 | Payer: BLUE CROSS/BLUE SHIELD | Primary: Family Medicine

## 2014-10-20 ENCOUNTER — Ambulatory Visit
Admit: 2014-10-20 | Discharge: 2014-10-20 | Payer: PRIVATE HEALTH INSURANCE | Attending: Family Medicine | Primary: Family Medicine

## 2014-10-20 DIAGNOSIS — F32A Depression, unspecified: Secondary | ICD-10-CM

## 2014-10-20 DIAGNOSIS — G40219 Localization-related (focal) (partial) symptomatic epilepsy and epileptic syndromes with complex partial seizures, intractable, without status epilepticus: Secondary | ICD-10-CM

## 2014-10-20 DIAGNOSIS — F329 Major depressive disorder, single episode, unspecified: Secondary | ICD-10-CM

## 2014-10-20 LAB — METABOLIC PANEL, COMPREHENSIVE
A-G Ratio: 1.5 (ref 1.2–2.2)
ALT (SGPT): 22 U/L (ref 12–78)
AST (SGOT): 21 U/L (ref 15–37)
Albumin: 4.3 g/dL (ref 3.4–5.0)
Alk. phosphatase: 69 U/L (ref 45–117)
Anion gap: 8 mmol/L (ref 6–15)
BUN/Creatinine ratio: 28 — ABNORMAL HIGH (ref 7–25)
BUN: 23 MG/DL — ABNORMAL HIGH (ref 7–18)
Bilirubin, total: 0.6 MG/DL (ref <1.1)
CO2: 30 mmol/L (ref 21–32)
Calcium: 9.3 MG/DL (ref 8.5–10.1)
Chloride: 105 mmol/L (ref 98–107)
Creatinine: 0.82 MG/DL (ref 0.60–1.30)
GFR est AA: >60 mL/min/{1.73_m2} (ref >60)
GFR est non-AA: >60 mL/min/{1.73_m2} (ref >60)
Globulin: 2.9 g/dL (ref 2.4–3.5)
Glucose: 79 mg/dL (ref 70–110)
Potassium: 4.4 mmol/L (ref 3.5–5.3)
Protein, total: 7.2 g/dL (ref 6.4–8.2)
Sodium: 143 mmol/L (ref 136–145)

## 2014-10-20 LAB — CBC WITH AUTOMATED DIFF
ABS. BASOPHILS: 0 10*3/uL (ref 0.0–0.1)
ABS. EOSINOPHILS: 0.1 10*3/uL (ref 0.0–0.5)
ABS. LYMPHOCYTES: 1.5 10*3/uL (ref 0.8–3.5)
ABS. MONOCYTES: 0.4 10*3/uL — ABNORMAL LOW (ref 0.8–3.5)
ABS. NEUTROPHILS: 2.6 10*3/uL (ref 1.5–8.0)
BASOPHILS: 1 % (ref 0–2)
EOSINOPHILS: 1 % (ref 0–5)
HCT: 36.6 % — ABNORMAL LOW (ref 41–53)
HGB: 11.7 g/dL — ABNORMAL LOW (ref 12.0–16.0)
LYMPHOCYTES: 33 % (ref 19–48)
MCH: 28.4 PG (ref 27–31)
MCHC: 32 g/dL (ref 31–37)
MCV: 88.8 FL (ref 80–100)
MONOCYTES: 9 % (ref 3–9)
MPV: 10.6 FL — ABNORMAL HIGH (ref 5.9–10.3)
NEUTROPHILS: 56 % (ref 40–74)
PLATELET: 188 10*3/uL (ref 130–400)
RBC: 4.12 M/uL — ABNORMAL LOW (ref 4.2–5.4)
RDW: 12.5 % (ref 11.5–14.5)
WBC: 4.6 10*3/uL (ref 4.5–10.8)

## 2014-10-20 NOTE — Progress Notes (Signed)
HISTORY OF PRESENT ILLNESS  Natasha Herman is a 49 y.o. female.    has a past medical history of Seizures ([place]) and Depression.    Chief Complaint   Patient presents with   ??? Follow Up Chronic Condition         HPI    Patient is a pleasant 49 yo WM with the above listed PMH who presents for evaluation of longstanding depression. Has been stable on this medication for several years. . She complains of no symptoms currently would like to discuss tapering off. Onset was approximately several years ago, controlled since that time.  She denies current suicidal and homicidal plan or intent. Possible organic causes contributing are: chronic health conditions.  . Current medication(s) includes Lexapro and no other therapies.  She complains of the following side effects from the treatment: none.      She has a longstanding hx of Eliepsy- still having active sseizures- seeing Neurologist (Dr. Juanito Doom)     Reviewed lab work:     Lab Results   Component Value Date/Time    WBC 4.7 04/14/2014 11:05 AM    HGB 12.5 04/14/2014 11:05 AM    HCT 39.1 04/14/2014 11:05 AM    PLATELET 209 04/14/2014 11:05 AM    MCV 88.7 04/14/2014 11:05 AM     Lab Results   Component Value Date/Time    SODIUM 145 04/14/2014 11:05 AM    POTASSIUM 4.6 04/14/2014 11:05 AM    CHLORIDE 107 04/14/2014 11:05 AM    CO2 31 04/14/2014 11:05 AM    ANION GAP 7 04/14/2014 11:05 AM    GLUCOSE 89 04/14/2014 11:05 AM    BUN 20 04/14/2014 11:05 AM    CREATININE 0.70 04/14/2014 11:05 AM    BUN/CREATININE RATIO 29 04/14/2014 11:05 AM    GFR EST AA >60 04/14/2014 11:05 AM    GFR EST NON-AA >60 04/14/2014 11:05 AM    CALCIUM 9.0 04/14/2014 11:05 AM    BILIRUBIN, TOTAL 0.4 04/14/2014 11:05 AM    ALT 24 04/14/2014 11:05 AM    AST 15 04/14/2014 11:05 AM    ALK. PHOSPHATASE 69 04/14/2014 11:05 AM    PROTEIN, TOTAL 7.3 04/14/2014 11:05 AM    ALBUMIN 4.3 04/14/2014 11:05 AM    GLOBULIN 3.0 04/14/2014 11:05 AM    A-G RATIO 1.4 04/14/2014 11:05 AM     Lab Results    Component Value Date/Time    CHOLESTEROL, TOTAL 158 04/14/2014 11:05 AM    HDL CHOLESTEROL 76 04/14/2014 11:05 AM    LDL, CALCULATED 71.44 04/14/2014 11:05 AM    VLDL, CALCULATED 13.2 04/14/2014 11:05 AM    TRIGLYCERIDE 66 04/14/2014 11:05 AM     Lab Results   Component Value Date/Time    TSH 1.82 04/14/2014 11:05 AM     Lab Results   Component Value Date/Time    HEMOGLOBIN A1C 5.5 04/14/2014 11:05 AM     Preventative Health    Patient has not yet had a screening colonoscopy.  Discussed risks vs benefits with patient in detail.  She denies any gastrointestinal symptoms, which include being  for heartburn, nausea, vomiting, abdominal pain, diarrhea, constipation, blood in stool and melena.  There is no family history of colon cancer.       ROS  A thorough 10 point review of systems was done and was unremarkable except for HPI and chronic medical conditions.     Past Medical History   Diagnosis Date   ??? Seizures (  [place])    ??? Depression      Past Surgical History   Procedure Laterality Date   ??? Pr breast surgery procedure unlisted       lumpectomy-2003   ??? Hx heent       wisdom teeth -1995   ??? Hx cyst removal       R. Bartolin Cyst-2001   ??? Hx craniotomy       2014 -- L temperol lobe    ??? Hx carotid endarterectomy       Family History   Problem Relation Age of Onset   ??? Cancer Mother      Breast   ??? Diabetes Mother    ??? Diabetes Sister    ??? Hypertension Sister    ??? Hypertension Mother    ??? Diabetes Maternal Grandmother    ??? Parkinsonism Father      History     Social History   ??? Marital Status: MARRIED     Spouse Name: N/A   ??? Number of Children: 1   ??? 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 Smoking status: Never Smoker    ??? Smokeless tobacco: Never Used   ??? Alcohol Use: No   ??? Drug Use: No   ??? Sexual Activity: No     Other Topics Concern   ??? Not on file     Social History Narrative     Allergies   Allergen Reactions   ??? Aptiom [Eslicarbazepine] Seizures    ??? Dilantin [Phenytoin Sodium Extended] Rash   ??? Fycompa [Perampanel] Other (comments)     PATIENT STATES SHE PASSED OUT     Current Outpatient Prescriptions on File Prior to Visit   Medication Sig Dispense Refill   ??? levETIRAcetam (KEPPRA XR) 500 mg ER tablet Take 3 Tabs by mouth two (2) times a day. Indications: COMPLEX-PARTIAL EPILEPSY 540 Tab 3   ??? lamoTRIgine (LAMICTAL XR) 100 mg tr24 ER tablet Take 1 Tab by mouth daily. Indications: COMPLEX-PARTIAL EPILEPSY 90 Tab 3   ??? lacosamide (VIMPAT) 150 mg tab tablet Take 1 Tab by mouth two (2) times a day. Max Daily Amount: 300 mg. 180 Tab 3   ??? escitalopram oxalate (LEXAPRO) 10 mg tablet Take 1 Tab by mouth daily. 90 Tab 3     No current facility-administered medications on file prior to visit.       Filed Vitals:    10/20/14 0943   BP: 118/74   Pulse: 67   Temp: 98.1 ??F (36.7 ??C)   TempSrc: Temporal   Resp: 18   Height: '6\' 1"'  (1.854 m)   Weight: 138 lb (62.596 kg)   SpO2: 98%   .  Body mass index is 18.21 kg/(m^2).          Physical Exam    General: NAD. A x O x 3     Skin: no evidence of rashes, scars, or moles    HEENT: Head: atraumatic normocephalic, Eyes:  PERLA, EOMI Ears: without discharge or pain of external exam Nares: patent Throat: with out erythema     Neck: no thyromegaly    Heart:  RRR, S1S2, no murmur, gallop, or rub    Lungs:   CTA bilaterally     Abdomen:  Soft, nontender, BS x 4    Extremities:  Strength 4/4, no cyanosis, no edema    ASSESSMENT and  PLAN    ICD-10-CM ICD-9-CM    1. Depression, unspecified depression type F32.9 311 CBC WITH AUTOMATED DIFF      METABOLIC PANEL, COMPREHENSIVE   2. Screening for colon cancer Z12.11 V76.51 REFERRAL FOR COLONOSCOPY   Continue with current neurologist     Patient Instructions    The following medical conditions and instructions for your medical care was discussed with DR.Elizabeht Suto during your appointment today:     Lexapro Taper:     1/2 tab po daily for 4 weeks   Then 1/2 tab po every other day for 2 weeks    Then stop               Pt is currently denies any homicidal or suicidal ideations, gestures, or plans.  Have discussed in detail the possible side effects of  Tapering off  SSRI.  Pt warned that if they would develop any SE including homicidal or suicidal ideations to please call office or 911.  Pt agree with the above plan and expresses understanding.     Follow-up Disposition:  Return in about 6 months (around 04/22/2015).  very strongly urged to quit smoking to reduce cardiovascular risk  reviewed medications and side effects in detail  radiology results and schedule of future radiology studies reviewed with patient    Continue with neurologist    I have discussed the above plan with the patient. Patient has also been given instructions and information on her conditions.  Patient is aware of all possible side effects and agrees with the above mentioned plan.       Hermenia Bers, DO

## 2014-10-20 NOTE — Patient Instructions (Signed)
The following medical conditions and instructions for your medical care was discussed with DR.Dakiya Puopolo during your appointment today:     Lexapro Taper:     1/2 tab po daily for 4 weeks   Then 1/2 tab po every other day for 2 weeks   Then stop

## 2014-10-22 LAB — LACOSAMIDE: Lacosamide: 6.3 ug/mL (ref 5.0–10.0)

## 2014-10-22 LAB — LAMOTRIGINE (LAMICTAL): Lamotrigine: 2.7 ug/mL (ref 2.0–20.0)

## 2014-10-23 NOTE — Progress Notes (Signed)
Quick Note:        Pt slightly anemic; would like her to follow up in [place], DO        ______

## 2014-10-24 NOTE — Progress Notes (Signed)
Quick Note:        Done.    ______

## 2014-10-26 NOTE — Progress Notes (Signed)
Quick Note:        Left msg for PT to call the office to sched appt    ______

## 2014-10-28 NOTE — Progress Notes (Signed)
Quick Note:        Made PT appt June 23    ______

## 2014-11-04 NOTE — Progress Notes (Signed)
REFERRAL TRACKING SHEET        Phoned Patient:  11/04/14 left message with patient's husband following up from referral    Letter Mailed to Patient:  11/04/14        Other notes:  11/04/14 received referral from referral center for screening colonoscopy with Dr Bari Mantis referral from Dr Jeronimo Greaves

## 2014-11-11 NOTE — Telephone Encounter (Signed)
Patient called in today to let me know she has received my letter regarding colonoscopy and also my follow up phone call but she is having a lot of seizures right now and once she gets this figured out she will call back to get this scheduled.

## 2014-11-26 ENCOUNTER — Ambulatory Visit
Admit: 2014-11-26 | Discharge: 2014-11-26 | Payer: PRIVATE HEALTH INSURANCE | Attending: Family Medicine | Primary: Family Medicine

## 2014-11-26 DIAGNOSIS — D649 Anemia, unspecified: Secondary | ICD-10-CM

## 2014-11-26 NOTE — Patient Instructions (Signed)
[place] working hours are Monday - Friday 8:00 am to 4:30 pm.   Saturday 9 am to 12 pm for acute illnesses only for established patients.   Office phone number is [phone] and fax is [phone].   For non-emergent medical care and clinical advice during office hours:   1. Call office or   2. Send message or request using MyChart   For non-emergent medical care and clinical advice after office hours:   1. Call [phone] or   2. Send message or request using MyChart   Emergency care can be obtained at the [facility], Urgent Care or calling 911.   Patient Satisfaction Survey   We appreciate you giving [place] your e-mail address. Please watch for our patient satisfaction survey which you will receive by e-mail. We strive to provide you with the best care possible. We respect all comments and will take comments into consideration to improve our service.   Thank you for your participation.    Anemia: Care Instructions  Your Care Instructions     Anemia is a low level of red blood cells, which carry oxygen throughout your body. Many things can cause anemia. Lack of iron is one of the most common causes. Your body needs iron to make hemoglobin, a substance in red blood cells that carries oxygen from the lungs to your body's cells. Without enough iron, the body produces fewer and smaller red blood cells. As a result, your body's cells do not get enough oxygen, and you feel tired and weak. And you may have trouble concentrating.  Bleeding is the most common cause of a lack of iron. You may have heavy menstrual bleeding or bleeding caused by conditions such as ulcers, hemorrhoids, or cancer. Regular use of aspirin or other anti-inflammatory medicines (such as ibuprofen) also can cause bleeding in some people. A lack of iron in your diet also can cause anemia, especially at times when the body needs more iron, such as during pregnancy, infancy, and the teen years.   Your doctor may have prescribed iron pills. It may take several months of treatment for your iron levels to return to normal. Your doctor also may suggest that you eat foods that are rich in iron, such as meat and beans.  There are many other causes of anemia. It is not always due to a lack of iron. Finding the specific cause of your anemia will help your doctor find the right treatment for you.  Follow-up care is a key part of your treatment and safety. Be sure to make and go to all appointments, and call your doctor if you are having problems. It's also a good idea to know your test results and keep a list of the medicines you take.  How can you care for yourself at home?  ?? Take your medicines exactly as prescribed. Call your doctor if you think you are having a problem with your medicine.  ?? If your doctor recommends iron pills, take them as directed:  ?? Try to take the pills on an empty stomach about 1 hour before or 2 hours after meals. But you may need to take iron with food to avoid an upset stomach.  ?? Do not take antacids or drink milk or caffeine drinks (such as coffee, tea, or cola) at the same time or within 2 hours of the time that you take your iron. They can make it hard for your body to absorb the iron.  ??  Vitamin C (from food or supplements) helps your body absorb iron. Try taking iron pills with a glass of orange juice or some other food that is high in vitamin C, such as citrus fruits.  ?? Iron pills may cause stomach problems, such as heartburn, nausea, diarrhea, constipation, and cramps. Be sure to drink plenty of fluids, and include fruits, vegetables, and fiber in your diet each day. Iron pills often make your bowel movements dark or green.  ?? If you forget to take an iron pill, do not take a double dose of iron the next time you take a pill.  ?? Keep iron pills out of the reach of small children. An overdose of iron can be very dangerous.   ?? Follow your doctor's advice about eating iron-rich foods. These include red meat, shellfish, poultry, eggs, beans, raisins, whole-grain bread, and leafy green vegetables.  ?? Steam vegetables to help them keep their iron content.  When should you call for help?  Call 911 anytime you think you may need emergency care. For example, call if:  ?? You have symptoms of a heart attack. These may include:  ?? Chest pain or pressure, or a strange feeling in the chest.  ?? Sweating.  ?? Shortness of breath.  ?? Nausea or vomiting.  ?? Pain, pressure, or a strange feeling in the back, neck, jaw, or upper belly or in one or both shoulders or arms.  ?? Lightheadedness or sudden weakness.  ?? A fast or irregular heartbeat.  After you call 911, the operator may tell you to chew 1 adult-strength or 2 to 4 low-dose aspirin. Wait for an ambulance. Do not try to drive yourself.  ?? You passed out (lost consciousness).  Call your doctor now or seek immediate medical care if:  ?? You have new or increased shortness of breath.  ?? You are dizzy or lightheaded, or you feel like you may faint.  ?? Your fatigue and weakness continue or get worse.  ?? You have any abnormal bleeding, such as:  ?? Nosebleeds.  ?? Vaginal bleeding that is different (heavier, more frequent, at a different time of the month) than what you are used to.  ?? Bloody or black stools, or rectal bleeding.  ?? Bloody or pink urine.  Watch closely for changes in your health, and be sure to contact your doctor if:  ?? You do not get better as expected.  Where can you learn more?  Go to [employer]  Enter R301 in the search box to learn more about "Anemia: Care Instructions."  ?? 2006-2016 [employer]. Care instructions adapted under license by [employer] (which disclaims liability or warranty for this information). This care instruction is for use with your licensed  healthcare professional. If you have questions about a medical condition or this instruction, always ask your healthcare professional. [place] any warranty or liability for your use of this information.  Content Version: 10.9.538570; Current as of: July 10, 2014

## 2014-11-26 NOTE — Progress Notes (Signed)
HISTORY OF PRESENT ILLNESS  Natasha Herman is a 49 y.o. female.    has a past medical history of Seizures ([place]) and Depression.    Chief Complaint   Patient presents with   ??? Follow-up     lab results, HGB 11.7         HPI    Patient is a 49 yo WF who presents for evaluation of anemia. Anemia was found by routine CBC.    She denies : fatigue, dizziness/lightheadedness, melena, hematochezia, abdominal pain, history of renal disease.  Pt has not had screening colonoscopy. Refuses at this time. Patient was advised that a colonoscopy is needed for screening and evaluation of patients symptoms. At this time, patient denies scheduling for colonoscopy or referral.  Patient is aware that denial of this procedure could prevent the diagnosis of possible medical conditions including colon cancer.        Lab Results   Component Value Date/Time    WBC 4.6 10/20/2014 10:45 AM    HGB 11.7 10/20/2014 10:45 AM    HCT 36.6 10/20/2014 10:45 AM    PLATELET 188 10/20/2014 10:45 AM    MCV 88.8 10/20/2014 10:45 AM     Lab Results   Component Value Date/Time    SODIUM 143 10/20/2014 10:45 AM    POTASSIUM 4.4 10/20/2014 10:45 AM    CHLORIDE 105 10/20/2014 10:45 AM    CO2 30 10/20/2014 10:45 AM    ANION GAP 8 10/20/2014 10:45 AM    GLUCOSE 79 10/20/2014 10:45 AM    BUN 23 10/20/2014 10:45 AM    CREATININE 0.82 10/20/2014 10:45 AM    BUN/CREATININE RATIO 28 10/20/2014 10:45 AM    GFR EST AA >60 10/20/2014 10:45 AM    GFR EST NON-AA >60 10/20/2014 10:45 AM    CALCIUM 9.3 10/20/2014 10:45 AM       ROS  A thorough 10 point review of systems was done and was unremarkable except for HPI and chronic medical conditions.     Past Medical History   Diagnosis Date   ??? Seizures ([place])    ??? Depression      Past Surgical History   Procedure Laterality Date   ??? Pr breast surgery procedure unlisted       lumpectomy-2003   ??? Hx heent       wisdom teeth -1995   ??? Hx cyst removal       R. Bartolin Cyst-2001   ??? Hx craniotomy       2014 -- L temperol lobe     ??? Hx carotid endarterectomy       Family History   Problem Relation Age of Onset   ??? Cancer Mother      Breast   ??? Diabetes Mother    ??? Diabetes Sister    ??? Hypertension Sister    ??? Hypertension Mother    ??? Diabetes Maternal Grandmother    ??? Parkinsonism Father      History     Social History   ??? Marital Status: MARRIED     Spouse Name: N/A   ??? Number of Children: 1   ??? 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 Smoking status: Never Smoker    ??? Smokeless tobacco: Never Used   ??? Alcohol Use: No   ??? Drug Use: No   ??? Sexual Activity: No  Other Topics Concern   ??? Not on file     Social History Narrative     Allergies   Allergen Reactions   ??? Aptiom [Eslicarbazepine] Seizures   ??? Dilantin [Phenytoin Sodium Extended] Rash   ??? Fycompa [Perampanel] Other (comments)     PATIENT STATES SHE PASSED OUT     Current Outpatient Prescriptions on File Prior to Visit   Medication Sig Dispense Refill   ??? levETIRAcetam (KEPPRA XR) 500 mg ER tablet Take 3 Tabs by mouth two (2) times a day. Indications: COMPLEX-PARTIAL EPILEPSY 540 Tab 3   ??? lamoTRIgine (LAMICTAL XR) 100 mg tr24 ER tablet Take 1 Tab by mouth daily. Indications: COMPLEX-PARTIAL EPILEPSY 90 Tab 3   ??? lacosamide (VIMPAT) 150 mg tab tablet Take 1 Tab by mouth two (2) times a day. Max Daily Amount: 300 mg. 180 Tab 3   ??? escitalopram oxalate (LEXAPRO) 10 mg tablet Take 1 Tab by mouth daily. 90 Tab 3     No current facility-administered medications on file prior to visit.       Filed Vitals:    11/26/14 1132   BP: 110/60   Pulse: 70   Temp: 97.9 ??F (36.6 ??C)   TempSrc: Temporal   Resp: 18   Height: 6\' 1"  (1.854 m)   Weight: 136 lb (61.689 kg)   SpO2: 99%   .  Body mass index is 17.95 kg/(m^2).          Physical Exam    General: NAD. A x O x 3     Skin: no evidence of rashes, scars, or moles    HEENT: Head: atraumatic normocephalic, Eyes:  PERLA, EOMI Ears: without  discharge or pain of external exam Nares: patent Throat: with out erythema     Neck: no thyromegaly    Heart:  RRR, S1S2, no murmur, gallop, or rub    Lungs:   CTA bilaterally     Abdomen:  Soft, nontender, BS x 4    Extremities:  Strength 4/4, no cyanosis, no edema    ASSESSMENT and PLAN    ICD-10-CM ICD-9-CM    1. Anemia, unspecified type D64.9 285.9 CBC WITH AUTOMATED DIFF      RETICULOCYTE COUNT      IRON PROFILE      FOLATE      VITAMIN B12   Hemoccult stool  x 3     Follow-up Disposition: after labs and stool cards   lab results and schedule of future lab studies reviewed with patient    I have discussed the above plan with the patient. Patient has also been given instructions and information on her conditions.  Patient is aware of all possible side effects and agrees with the above mentioned plan.       Hermenia Bers, DO

## 2014-11-30 ENCOUNTER — Inpatient Hospital Stay: Admit: 2014-11-30 | Payer: BLUE CROSS/BLUE SHIELD | Primary: Family Medicine

## 2014-11-30 ENCOUNTER — Encounter

## 2014-11-30 DIAGNOSIS — D649 Anemia, unspecified: Secondary | ICD-10-CM

## 2014-11-30 LAB — CBC WITH AUTOMATED DIFF
ABS. BASOPHILS: 0.1 10*3/uL (ref 0.0–0.1)
ABS. EOSINOPHILS: 0.1 10*3/uL (ref 0.0–0.5)
ABS. LYMPHOCYTES: 1.9 10*3/uL (ref 0.8–3.5)
ABS. MONOCYTES: 0.5 10*3/uL — ABNORMAL LOW (ref 0.8–3.5)
ABS. NEUTROPHILS: 2.7 10*3/uL (ref 1.5–8.0)
BASOPHILS: 1 % (ref 0–2)
EOSINOPHILS: 2 % (ref 0–5)
HCT: 38.5 % — ABNORMAL LOW (ref 41–53)
HGB: 12.2 g/dL (ref 12.0–16.0)
LYMPHOCYTES: 37 % (ref 19–48)
MCH: 28.3 PG (ref 27–31)
MCHC: 31.7 g/dL (ref 31–37)
MCV: 89.3 FL (ref 80–100)
MONOCYTES: 9 % (ref 3–9)
MPV: 10.6 FL — ABNORMAL HIGH (ref 5.9–10.3)
NEUTROPHILS: 51 % (ref 40–74)
PLATELET: 195 10*3/uL (ref 130–400)
RBC: 4.31 M/uL (ref 4.2–5.4)
RDW: 12.5 % (ref 11.5–14.5)
WBC: 5.3 10*3/uL (ref 4.5–10.8)

## 2014-11-30 LAB — RETICULOCYTE COUNT: Reticulocyte count: 0.7 % — ABNORMAL LOW (ref 1.0–1.5)

## 2014-11-30 LAB — AMB POC FECAL BLOOD, OCCULT, QL 3 CARDS
Hemoccult (POC): POSITIVE
Occult Blood-2 (POC): POSITIVE
Occult blood-3 (POC): POSITIVE

## 2014-11-30 LAB — IRON PROFILE
Iron % saturation: 20 %
Iron: 58 ug/dL (ref 50–175)
TIBC: 296 ug/dL (ref 250–450)

## 2014-11-30 LAB — VITAMIN B12: Vitamin B12: 873 pg/mL (ref 254–1320)

## 2014-11-30 LAB — FOLATE: Folate: >20 ng/mL — ABNORMAL HIGH (ref 3.1–17.5)

## 2014-11-30 NOTE — Progress Notes (Signed)
??   Tiffany L Depriest, LPN  Hermenia Bers, DO ??   ??    ??  ??   ?? Patient brought in stool cards and all three were positive (+)       Pt let know -   Pt has already been referred to GI     Hermenia Bers, DO

## 2014-11-30 NOTE — Progress Notes (Signed)
Quick Note:        Let pt know    Already has appointment with GI    Hermenia Bers, DO        ______

## 2014-11-30 NOTE — Progress Notes (Signed)
Quick Note:        Have pt schedule appointment after her colonoscopy    Let pt know Anemia have improved    ______

## 2014-11-30 NOTE — Progress Notes (Signed)
Quick Note:        Patient notified of results and given Dr. Bari Mantis office number to call and schedule appointment. Patient voiced understanding    ______

## 2014-12-10 NOTE — Progress Notes (Signed)
Quick Note:        Contacted patient and let her know Anemia improved. She stated she has not scheduled her colonoscopy yet but would call office and schedule appointment as soon as that appointment is made    ______

## 2014-12-10 NOTE — Progress Notes (Signed)
Quick Note:        Was this done ?    ______

## 2014-12-10 NOTE — Progress Notes (Signed)
Spoke to patient and let her know anemia has improved.  Asked her to schedule appointment after colonoscopy.  She stated she has not scheduled the colonoscopy yet but will call office and schedule appointment as soon as she does

## 2014-12-30 ENCOUNTER — Encounter: Attending: Neurology | Primary: Family Medicine

## 2015-02-03 ENCOUNTER — Ambulatory Visit
Admit: 2015-02-03 | Discharge: 2015-02-03 | Payer: PRIVATE HEALTH INSURANCE | Attending: Neurology | Primary: Family Medicine

## 2015-02-03 DIAGNOSIS — G40219 Localization-related (focal) (partial) symptomatic epilepsy and epileptic syndromes with complex partial seizures, intractable, without status epilepticus: Secondary | ICD-10-CM

## 2015-02-03 MED ORDER — LEVETIRACETAM ER 500 MG 24 HR TAB
500 mg | ORAL_TABLET | Freq: Two times a day (BID) | ORAL | 3 refills | Status: DC
Start: 2015-02-03 — End: 2015-08-10

## 2015-02-03 MED ORDER — LAMOTRIGINE ER 50 MG 24 HR TAB
50 mg | ORAL_TABLET | Freq: Every day | ORAL | 3 refills | Status: DC
Start: 2015-02-03 — End: 2015-08-10

## 2015-02-03 MED ORDER — LAMOTRIGINE ER 50 MG 24 HR TAB
50 mg | ORAL_TABLET | Freq: Every day | ORAL | 3 refills | Status: DC
Start: 2015-02-03 — End: 2015-02-03

## 2015-02-03 MED ORDER — LAMOTRIGINE ER 100 MG 24 HR TAB
100 mg | ORAL_TABLET | Freq: Every day | ORAL | 3 refills | Status: DC
Start: 2015-02-03 — End: 2015-08-10

## 2015-02-03 NOTE — Progress Notes (Signed)
Natasha Raymond, MD   [address], [place] [address]  Phone:  [phone]  Fax:  [phone]      Patient ID  Name:  Natasha Herman  DOB:  [birth year]  MRN:  [id number]  Age:  49 y.o.  PCP:  Hermenia Bers, DO    Subjective:     Encounter Date:  02/03/2015    Referring Physician: No ref. provider found    Chief Complaint   Patient presents with   ??? Follow-up     Routine office visit. Patient had labs done at [facility]. Patient states she is still having seizures every day.        History of Present Illness:     49 year old female with left temporal lobectomy in 2014  Having partial seizures without altered awareness since then   2/ day seizures frequency did not increase since then  Since she cut down on lexapro she is having one seizure every day and occasionally 3/ day  She had an episodes when she was with for few seconds, with sensory changes on top of the head, right arm numbness  Epigastric sensation. She did not have confusion or altered awareness after the spell    According to her this had electrical correlate on EEG.    Current Outpatient Prescriptions on File Prior to Visit   Medication Sig Dispense Refill   ??? levETIRAcetam (KEPPRA XR) 500 mg ER tablet Take 3 Tabs by mouth two (2) times a day. Indications: COMPLEX-PARTIAL EPILEPSY 540 Tab 3   ??? lamoTRIgine (LAMICTAL XR) 100 mg tr24 ER tablet Take 1 Tab by mouth daily. Indications: COMPLEX-PARTIAL EPILEPSY 90 Tab 3   ??? lacosamide (VIMPAT) 150 mg tab tablet Take 1 Tab by mouth two (2) times a day. Max Daily Amount: 300 mg. 180 Tab 3     No current facility-administered medications on file prior to visit.       Allergies   Allergen Reactions   ??? Aptiom [Eslicarbazepine] Seizures   ??? Dilantin [Phenytoin Sodium Extended] Rash   ??? Fycompa [Perampanel] Other (comments)     [place] OUT     Patient Active Problem List   Diagnosis Code   ??? Epilepsy without status epilepticus, not intractable ([place]) G40.909   ??? Depression F32.9      Past Medical History   Diagnosis Date   ??? Depression    ??? Seizures ([place])       Past Surgical History   Procedure Laterality Date   ??? Pr breast surgery procedure unlisted       lumpectomy-2003   ??? Hx heent       wisdom teeth -1995   ??? Hx cyst removal       R. Bartolin Cyst-2001   ??? Hx craniotomy       2014 -- L temperol lobe    ??? Hx carotid endarterectomy        Family History   Problem Relation Age of Onset   ??? Cancer Mother      Breast   ??? Diabetes Mother    ??? Hypertension Mother    ??? Parkinsonism Father    ??? Diabetes Sister    ??? Hypertension Sister    ??? Diabetes Maternal Grandmother       Social History     Social History   ??? Marital status: MARRIED     Spouse name: N/A   ??? Number of children:  1   ??? 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 Smoking status: Never Smoker   ??? Smokeless tobacco: Never Used   ??? Alcohol use No   ??? Drug use: No   ??? Sexual activity: No     Other Topics Concern   ??? None     Social History Narrative       Review of Systems:  Review of Systems   Constitutional: Negative for fever.   Eyes: Positive for blurred vision.   Respiratory: Negative for cough.    Cardiovascular: Negative for chest pain.   Gastrointestinal: Negative for heartburn.   Genitourinary: Negative for dysuria.   Musculoskeletal: Negative for myalgias.   Skin: Negative for rash.   Neurological: Positive for seizures and headaches.   Endo/Heme/Allergies: Does not bruise/bleed easily.   Psychiatric/Behavioral: The patient has insomnia.          Objective:     Vitals:    02/03/15 1419   BP: 115/72   Pulse: (!) 57   Weight: 60.3 kg (133 lb)   Height: 6\' 1"  (1.854 m)   PainSc:   0 - No pain       Physical Exam:  Neurologic Exam     Mental Status   Oriented to person, place, and time.     Cranial Nerves   Cranial nerves II through XII intact.     Motor Exam   Muscle bulk: normal  Overall muscle tone: normal    Sensory Exam   Light touch normal.           Impression:      49 year old female with   Left temporal lobectomy  Very few sec spells or sensory changes  Without altered awareness happening since 2 years without much change in intensity and frequency  Cutting down on lexapro helped    Plan:     1. Partial symptomatic epilepsy with complex partial seizures, intractable, without status epilepticus (HCC)  Continue vimpat  - lamoTRIgine (LAMICTAL XR) 100 mg tr24 ER tablet; Take 1 Tab by mouth daily. Indications: COMPLEX-PARTIAL EPILEPSY  Dispense: 90 Tab; Refill: 3  - levETIRAcetam (KEPPRA XR) 500 mg ER tablet; Take 3 Tabs by mouth two (2) times a day. Indications: COMPLEX-PARTIAL EPILEPSY  Dispense: 540 Tab; Refill: 3  - lamoTRIgine (LAMICTAL XR) 50 mg tr24 ER tablet; Take 1 Tab by mouth daily.  Dispense: 180 Tab; Refill: 3      Increase the lamictal by 200mg (150mg  in first week followed by 200mg )    Discussed about VNS, RNS,DBS,surgical options pt want to conservatively follow her spells to see if they get worse or increase in intensity and consider it.      I have spent more than half of 25 minutes face to face time  in discussion about primary diagnosis, side-effects of treatments, plan of care, precautions.Marland Kitchen

## 2015-02-03 NOTE — Patient Instructions (Signed)
As a valued patient, you will be receiving a survey from [employer].  We encourage you to share your thoughts and opinions about the care you received today.  Thank you for choosing [facility].  [facility]  Office working hours are Monday - Thursday 9:00 am to 5:00 pm.  Friday 8 am to 12 noon with limited staff.  Office phone number is [phone] and fax is [phone].  For non-emergent medical care and clinical advice during office hours:   1. Call office or   2.  Send message or request using MyChart  For non-emergent medical care and clinical advice after office hours:  1.  Call [phone] or  2.  Send message or request using MyChart  Emergency care can be obtained at the [facility] ER, Urgent Care or calling 911.    Patient Satisfaction Survey  We appreciate you giving us your e-mail address.  Please watch for our patient satisfaction survey which you will receive by e-mail.  We strive to provide you with the best care possible.  We respect all comments and will take comments into consideration to improve our service.  Thank you for your participation.

## 2015-03-22 ENCOUNTER — Encounter

## 2015-03-22 MED ORDER — LACOSAMIDE 150 MG TAB
150 mg | ORAL_TABLET | Freq: Two times a day (BID) | ORAL | 3 refills | Status: DC
Start: 2015-03-22 — End: 2015-08-10

## 2015-08-03 ENCOUNTER — Encounter

## 2015-08-05 ENCOUNTER — Inpatient Hospital Stay: Admit: 2015-08-05 | Payer: BLUE CROSS/BLUE SHIELD | Primary: Family Medicine

## 2015-08-05 DIAGNOSIS — Z79899 Other long term (current) drug therapy: Secondary | ICD-10-CM

## 2015-08-05 LAB — METABOLIC PANEL, COMPREHENSIVE
A-G Ratio: 1.5 (ref 1.2–2.2)
ALT (SGPT): 15 U/L (ref 12–78)
AST (SGOT): 12 U/L — ABNORMAL LOW (ref 15–37)
Albumin: 4.4 g/dL (ref 3.4–5.0)
Alk. phosphatase: 64 U/L (ref 45–117)
Anion gap: 8 mmol/L (ref 6–15)
BUN/Creatinine ratio: 28 — ABNORMAL HIGH (ref 7–25)
BUN: 22 MG/DL — ABNORMAL HIGH (ref 7–18)
Bilirubin, total: 0.5 MG/DL (ref <1.1)
CO2: 30 mmol/L (ref 21–32)
Calcium: 9 MG/DL (ref 8.5–10.1)
Chloride: 109 mmol/L — ABNORMAL HIGH (ref 98–107)
Creatinine: 0.8 MG/DL (ref 0.60–1.30)
GFR est AA: >60 mL/min/{1.73_m2} (ref >60)
GFR est non-AA: >60 mL/min/{1.73_m2} (ref >60)
Globulin: 2.9 g/dL (ref 2.4–3.5)
Glucose: 99 mg/dL (ref 70–110)
Potassium: 4.3 mmol/L (ref 3.5–5.3)
Protein, total: 7.3 g/dL (ref 6.4–8.2)
Sodium: 147 mmol/L — ABNORMAL HIGH (ref 136–145)

## 2015-08-05 LAB — CBC WITH AUTOMATED DIFF
ABS. BASOPHILS: 0 10*3/uL (ref 0.0–0.1)
ABS. EOSINOPHILS: 0.1 10*3/uL (ref 0.0–0.5)
ABS. LYMPHOCYTES: 2 10*3/uL (ref 0.8–3.5)
ABS. MONOCYTES: 0.4 10*3/uL — ABNORMAL LOW (ref 2.0–8.0)
ABS. NEUTROPHILS: 2.5 10*3/uL (ref 1.5–8.0)
BASOPHILS: 1 % (ref 0–2)
EOSINOPHILS: 2 % (ref 0–5)
HCT: 38.2 % — ABNORMAL LOW (ref 41–53)
HGB: 12.6 g/dL (ref 12.0–16.0)
LYMPHOCYTES: 39 % (ref 19–48)
MCH: 29.2 PG (ref 27–31)
MCHC: 33 g/dL (ref 31–37)
MCV: 88.4 FL (ref 80–100)
MONOCYTES: 9 % (ref 3–9)
MPV: 11.1 FL — ABNORMAL HIGH (ref 5.9–10.3)
NEUTROPHILS: 49 % (ref 40–74)
PLATELET: 201 10*3/uL (ref 130–400)
RBC: 4.32 M/uL (ref 4.2–5.4)
RDW: 12.4 % (ref 11.5–14.5)
WBC: 5.1 10*3/uL (ref 4.5–10.8)

## 2015-08-05 NOTE — Progress Notes (Signed)
Labs were collected @ 910 am per Dr. Juanito Doom  1-purple tube  1-green tube

## 2015-08-10 ENCOUNTER — Ambulatory Visit
Admit: 2015-08-10 | Discharge: 2015-08-10 | Payer: PRIVATE HEALTH INSURANCE | Attending: Neurology | Primary: Family Medicine

## 2015-08-10 DIAGNOSIS — G40219 Localization-related (focal) (partial) symptomatic epilepsy and epileptic syndromes with complex partial seizures, intractable, without status epilepticus: Secondary | ICD-10-CM

## 2015-08-10 MED ORDER — LEVETIRACETAM ER 750 MG 24 HR TAB
750 mg | ORAL_TABLET | Freq: Two times a day (BID) | ORAL | 3 refills | Status: DC
Start: 2015-08-10 — End: 2016-03-21

## 2015-08-10 MED ORDER — LACOSAMIDE 150 MG TAB
150 mg | ORAL_TABLET | Freq: Two times a day (BID) | ORAL | 3 refills | Status: DC
Start: 2015-08-10 — End: 2016-03-20

## 2015-08-10 MED ORDER — LAMOTRIGINE ER 100 MG 24 HR TAB
100 mg | ORAL_TABLET | Freq: Every day | ORAL | 3 refills | Status: DC
Start: 2015-08-10 — End: 2016-03-21

## 2015-08-10 NOTE — Patient Instructions (Signed)
[facility]  Office working hours are Monday - Thursday 9:00 am to 5:00 pm.  Friday 8 am to 12 noon with limited staff.  Office phone number is [phone] and fax is [phone].  For non-emergent medical care and clinical advice during office hours:   1. Call office or   2.  Send message or request using MyChart  For non-emergent medical care and clinical advice after office hours:  1.  Call [phone] or  2.  Send message or request using MyChart  Emergency care can be obtained at the [facility] ER, Urgent Care or calling 911.    Patient Satisfaction Survey  We appreciate you giving us your e-mail address.  Please watch for our patient satisfaction survey which you will receive by e-mail.  We strive to provide you with the best care possible.  We respect all comments and will take comments into consideration to improve our service.  Thank you for your participation.    As a valued patient, you will be receiving a survey from [employer].  We encourage you to share your thoughts and opinions about the care you received today.  Thank you for choosing [facility].

## 2015-08-10 NOTE — Progress Notes (Signed)
[name], MD   [address] [place] [address]  Phone:  [phone]  Fax:  [phone]      Patient ID  Name:  Natasha Herman  DOB:  12-Feb-1966  MRN:  [id number]  Age:  50 y.o.  PCP:  Hermenia Bers, DO    Subjective:     Encounter Date:  08/10/2015    Referring Physician: No ref. provider found    Chief Complaint   Patient presents with   ??? Follow-up     6 month f/u. Patient states she had continued to have seizures daily with a sporadic pattern.        History of Present Illness:     "50 year old female with left temporal lobectomy in 2014  Having partial seizures without altered awareness since then   2/ day seizures frequency did not increase since then  Since she cut down on lexapro she is having one seizure every day and occasionally 3/ day  She had an episodes when she was with for few seconds, with sensory changes on top of the head, right arm numbness  Epigastric sensation. She did not have confusion or altered awareness after the spell    According to her this had electrical correlate on EEG."    Pt having 6-7 days of seizures freedom followed by cluster of 3 seizures  Pt feels tired after the episode. Feels the aura, no LOC  Did not tolerated the increase in dose of lamictal   Pt husband said video EEG while on medications did not capture present episodes pt having  But according to pt they were captured and these were seizures    Current Outpatient Prescriptions on File Prior to Visit   Medication Sig Dispense Refill   ??? lacosamide (VIMPAT) 150 mg tab tablet Take 1 Tab by mouth two (2) times a day. Max Daily Amount: 300 mg. 180 Tab 3   ??? lamoTRIgine (LAMICTAL XR) 100 mg tr24 ER tablet Take 1 Tab by mouth daily. Indications: COMPLEX-PARTIAL EPILEPSY 90 Tab 3   ??? levETIRAcetam (KEPPRA XR) 500 mg ER tablet Take 3 Tabs by mouth two (2) times a day. Indications: COMPLEX-PARTIAL EPILEPSY 540 Tab 3   ??? lamoTRIgine (LAMICTAL XR) 50 mg tr24 ER tablet Take 1 Tab by mouth  daily. 180 Tab 3     No current facility-administered medications on file prior to visit.       Allergies   Allergen Reactions   ??? Aptiom [Eslicarbazepine] Seizures   ??? Dilantin [Phenytoin Sodium Extended] Rash   ??? Fycompa [Perampanel] Other (comments)     [place]an OUT     Patient Active Problem List   Diagnosis Code   ??? Epilepsy without status epilepticus, not intractable ([place]) G40.909   ??? Depression F32.9     Past Medical History:   Diagnosis Date   ??? Depression    ??? Seizures ([place])       Past Surgical History:   Procedure Laterality Date   ??? BREAST SURGERY PROCEDURE UNLISTED      lumpectomy-2003   ??? HX CAROTID ENDARTERECTOMY     ??? HX CRANIOTOMY      2014 -- L temperol lobe    ??? HX CYST REMOVAL      R. Bartolin Cyst-2001   ??? HX HEENT      wisdom teeth -1995      Family History   Problem Relation Age of Onset   ???  Cancer Mother      Breast   ??? Diabetes Mother    ??? Hypertension Mother    ??? Parkinsonism Father    ??? Diabetes Sister    ??? Hypertension Sister    ??? Diabetes Maternal Grandmother       Social History     Social History   ??? Marital status: MARRIED     Spouse name: N/A   ??? Number of children: 1   ??? 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 Smoking status: Never Smoker   ??? Smokeless tobacco: Never Used   ??? Alcohol use No   ??? Drug use: No   ??? Sexual activity: No     Other Topics Concern   ??? None     Social History Narrative       Review of Systems:  Review of Systems   Constitutional: Negative for fever.   Eyes: Positive for blurred vision.   Respiratory: Negative for cough.    Cardiovascular: Negative for chest pain.   Gastrointestinal: Negative for heartburn.   Genitourinary: Negative for dysuria.   Musculoskeletal: Negative for myalgias.   Skin: Negative for rash.   Neurological: Positive for seizures. Negative for headaches.   Endo/Heme/Allergies: Does not bruise/bleed easily.   Psychiatric/Behavioral: Negative for depression.          Objective:     Vitals:    08/10/15 1449   Weight: 60.3 kg (133 lb)   Height: 6\' 1"  (1.854 m)   PainSc:   0 - No pain       Physical Exam:  Neurologic Exam     Mental Status   Oriented to person, place, and time.     Cranial Nerves   Cranial nerves II through XII intact.     Motor Exam   Muscle bulk: normal  Overall muscle tone: normal    Sensory Exam   Light touch normal.           Impression:     50 year old female with   Left temporal lobectomy  Very few sec spells or sensory changes  Without altered awareness happening since 2 years without much change in intensity and frequency  Cutting down on lexapro helped      Pt having periods of spell remission followed by cluster of spells  This could be aura vs complex partial seizures  Plan:   1. Partial symptomatic epilepsy with complex partial seizures, intractable, without status epilepticus (HCC)    - lamoTRIgine (LAMICTAL XR) 100 mg tr24 ER tablet; Take 1 Tab by mouth daily. Indications: COMPLEX-PARTIAL EPILEPSY  Dispense: 90 Tab; Refill: 3  - levETIRAcetam (KEPPRA XR) 750 mg ER tablet; Take 2 Tabs by mouth two (2) times a day. Indications: COMPLEX-PARTIAL EPILEPSY  Dispense: 360 Tab; Refill: 3  - lacosamide (VIMPAT) 150 mg tab tablet; Take 1 Tab by mouth two (2) times a day. Max Daily Amount: 300 mg.  Dispense: 180 Tab; Refill: 3  - CBC WITH AUTOMATED DIFF; Future  - CMP; Future    Discussed the findings of labs and also raising keppra or adding Topamax to the present regimen.      I have spent more than half of 25 minutes face to face time  in discussion about primary diagnosis, side-effects of treatments, plan of care, precautions.Marland Kitchen

## 2015-10-20 ENCOUNTER — Encounter: Attending: Family Medicine | Primary: Family Medicine

## 2015-10-20 ENCOUNTER — Ambulatory Visit
Admit: 2015-10-20 | Discharge: 2015-10-20 | Payer: PRIVATE HEALTH INSURANCE | Attending: Family Medicine | Primary: Family Medicine

## 2015-10-20 DIAGNOSIS — D229 Melanocytic nevi, unspecified: Secondary | ICD-10-CM

## 2015-10-20 NOTE — Progress Notes (Signed)
HISTORY OF PRESENT ILLNESS  Natasha Herman is a 50 y.o. female.    has a past medical history of Depression and Seizures ([place]).    Chief Complaint   Patient presents with   ??? Follow Up Chronic Condition         HPI    Pt presents to the office for follow-up care:    Pt has a nevi on chest has been present for over a decade but in the last year it has grown in size and had changed color-  Darker in Middle located on sternoclavicular notch        She has a longstanding hx of Eliepsy- still having active sseizures- seeing Neurologist Koneru    Reviewed lab work:     Lab Results   Component Value Date/Time    WBC 5.1 08/05/2015 09:10 AM    HGB 12.6 08/05/2015 09:10 AM    HCT 38.2 08/05/2015 09:10 AM    PLATELET 201 08/05/2015 09:10 AM    MCV 88.4 08/05/2015 09:10 AM     Lab Results   Component Value Date/Time    Sodium 147 08/05/2015 09:10 AM    Potassium 4.3 08/05/2015 09:10 AM    Chloride 109 08/05/2015 09:10 AM    CO2 30 08/05/2015 09:10 AM    Anion gap 8 08/05/2015 09:10 AM    Glucose 99 08/05/2015 09:10 AM    BUN 22 08/05/2015 09:10 AM    Creatinine 0.80 08/05/2015 09:10 AM    BUN/Creatinine ratio 28 08/05/2015 09:10 AM    GFR est AA >60 08/05/2015 09:10 AM    GFR est non-AA >60 08/05/2015 09:10 AM    Calcium 9.0 08/05/2015 09:10 AM    Bilirubin, total 0.5 08/05/2015 09:10 AM    AST (SGOT) 12 08/05/2015 09:10 AM    Alk. phosphatase 64 08/05/2015 09:10 AM    Protein, total 7.3 08/05/2015 09:10 AM    Albumin 4.4 08/05/2015 09:10 AM    Globulin 2.9 08/05/2015 09:10 AM    A-G Ratio 1.5 08/05/2015 09:10 AM    ALT (SGPT) 15 08/05/2015 09:10 AM     Lab Results   Component Value Date/Time    Cholesterol, total 158 04/14/2014 11:05 AM    HDL Cholesterol 76 04/14/2014 11:05 AM    LDL, calculated 71.44 04/14/2014 11:05 AM    VLDL, calculated 13.2 04/14/2014 11:05 AM    Triglyceride 66 04/14/2014 11:05 AM     Lab Results   Component Value Date/Time    TSH 1.82 04/14/2014 11:05 AM     Lab Results    Component Value Date/Time    Hemoglobin A1c 5.5 04/14/2014 11:05 AM     Preventative Health    Patient has not yet had a screening colonoscopy.  Discussed risks vs benefits with patient in detail.  She denies any gastrointestinal symptoms, which include being  for heartburn, nausea, vomiting, abdominal pain, diarrhea, constipation, blood in stool and melena.  There is no family history of colon cancer.     Screening Lipid needed   ROS  A thorough 10 point review of systems was done and was unremarkable except for HPI and chronic medical conditions.     Past Medical History:   Diagnosis Date   ??? Depression    ??? Seizures ([place])      Past Surgical History:   Procedure Laterality Date   ??? BREAST SURGERY PROCEDURE UNLISTED      lumpectomy-2003   ??? HX CAROTID ENDARTERECTOMY     ???  HX CRANIOTOMY      2014 -- L temperol lobe    ??? HX CYST REMOVAL      R. Bartolin Cyst-2001   ??? HX HEENT      wisdom teeth -1995     Family History   Problem Relation Age of Onset   ??? Cancer Mother      Breast   ??? Diabetes Mother    ??? Hypertension Mother    ??? Parkinsonism Father    ??? Diabetes Sister    ??? Hypertension Sister    ??? Diabetes Maternal Grandmother      Social History     Social History   ??? Marital status: MARRIED     Spouse name: N/A   ??? Number of children: 1   ??? 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 Smoking status: Never Smoker   ??? Smokeless tobacco: Never Used   ??? Alcohol use No   ??? Drug use: No   ??? Sexual activity: No     Other Topics Concern   ??? Not on file     Social History Narrative     Allergies   Allergen Reactions   ??? Aptiom [Eslicarbazepine] Seizures   ??? Dilantin [Phenytoin Sodium Extended] Rash   ??? Fycompa [Perampanel] Other (comments)     PATIENT STATES SHE PASSED OUT     Current Outpatient Prescriptions on File Prior to Visit   Medication Sig Dispense Refill   ??? ascorbic acid, vitamin C, (VITAMIN C) 500 mg tablet Take 500 mg by mouth daily.      ??? cyanocobalamin 1,000 mcg tablet Take 1,000 mcg by mouth daily.     ??? b complex vitamins (B COMPLEX 1) tablet Take 1 Tab by mouth daily.     ??? lamoTRIgine (LAMICTAL XR) 100 mg tr24 ER tablet Take 1 Tab by mouth daily. Indications: COMPLEX-PARTIAL EPILEPSY 90 Tab 3   ??? levETIRAcetam (KEPPRA XR) 750 mg ER tablet Take 2 Tabs by mouth two (2) times a day. Indications: COMPLEX-PARTIAL EPILEPSY 360 Tab 3   ??? lacosamide (VIMPAT) 150 mg tab tablet Take 1 Tab by mouth two (2) times a day. Max Daily Amount: 300 mg. 180 Tab 3     No current facility-administered medications on file prior to visit.        Vitals:    10/20/15 0839   BP: 112/62   Pulse: 81   Resp: 18   Temp: 98 ??F (36.7 ??C)   TempSrc: Temporal   SpO2: 98%   Weight: 131 lb (59.4 kg)   Height: '6\' 1"'  (1.854 m)   .  Body mass index is 17.28 kg/(m^2).          Physical Exam    General: NAD. A x O x 3     Skin: 9 mm thoracic nevi with color change     HEENT: Head: atraumatic normocephalic, Eyes:  PERLA, EOMI Ears: without discharge or pain of external exam Nares: patent Throat: with out erythema     Neck: no thyromegaly    Heart:  RRR, S1S2, no murmur, gallop, or rub    Lungs:   CTA bilaterally     Abdomen:  Soft, nontender, BS x 4     Extremities:  Strength 4/4, no cyanosis, no edema    ASSESSMENT and PLAN    ICD-10-CM ICD-9-CM    1. 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 Screening, lipid Z13.220 V77.91 LIPID PANEL   3. Preventative health care Z00.00 V70.0 LIPID PANEL   4. Nonintractable epilepsy without status epilepticus, unspecified epilepsy type ([place]) G40.909 345.90    5. Screening for colon cancer Z12.11 V76.51 REFERRAL TO GENERAL SURGERY      LIPID PANEL   Continue with current neurologist     Follow-up Disposition: 1 year     very strongly urged to quit smoking to reduce cardiovascular risk  reviewed medications and side effects in detail  radiology results and schedule of future radiology studies reviewed with patient     Continue with neurologist    I have discussed the above plan with the patient. Patient has also been given instructions and information on her conditions.  Patient is aware of all possible side effects and agrees with the above mentioned plan.       Hermenia Bers, DO

## 2015-10-20 NOTE — Patient Instructions (Signed)
[facility]  Office working hours are Monday - Friday 8:00AM-5:00PM.  Office phone number is [phone] and fax is [phone]  For non-emergent medical care and clinical advice during office hours:   1. Call office or   2.  Send message or request using MyChart  For non-emergent medical care and clinical advice after office hours:  1.  Send message or request using MyChart   2.  Call [phone] and leave a message with our answering service or    Emergency care can be obtained at the [facility] ER, Urgent Care or by calling 911.    Patient Satisfaction Survey  As a valued patient, you will be receiving a survey from [employer].  We encourage you to share your thoughts and opinions about the care you received today. Thank you for choosing [facility]           BRING ALL MEDICATION BOTTLES TO EVERY APPOINTMENT    Medication Refills -   * Please do not let your medication run out before calling for a refill, the best practice would be to call when you have 2 weeks of medication left.  * Please allow 48 hours for medications to be called into your pharmacy when you request a medication refill      Have MyChart? Use it to request medication refills, it's quick and easy! If you have questions about MyChart please feel free to ask our staff!

## 2015-11-23 ENCOUNTER — Encounter: Attending: Surgery | Primary: Family Medicine

## 2015-12-01 ENCOUNTER — Encounter: Attending: Surgery | Primary: Family Medicine

## 2015-12-14 ENCOUNTER — Ambulatory Visit
Admit: 2015-12-14 | Discharge: 2015-12-14 | Payer: PRIVATE HEALTH INSURANCE | Attending: Surgery | Primary: Family Medicine

## 2015-12-14 DIAGNOSIS — L989 Disorder of the skin and subcutaneous tissue, unspecified: Secondary | ICD-10-CM

## 2015-12-14 NOTE — Progress Notes (Signed)
[name], M.D., F.A.C.S.  GENERAL SURGERY  [place] [address]  [phone]) [phone]  FAX: [phone]          OFFICE EVALUATION      No ref. provider found    PCP:  Hermenia Bers, DO    Patient: Natasha Herman               Sex: female             MRN: [id number]     Date of Birth:  February 25, 1966      Age:  50 y.o.              Subjective:     Natasha Herman is a 50 y.o. female who presents for evaluation for screening colonoscopy.    Additional complaints include waxy raised skin lesion left medial scapular.    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    Review of Systems  A comprehensive review of systems was negative except for that written in the History of Present Illness.  Please see health history survey, noted family history of breast cancer in mother, also allergy to "pink liquid" taken as an infant, and the         Past Medical History:   Diagnosis Date   ??? Depression    ??? Seizures ([place])        Past Surgical History:   Procedure Laterality Date   ??? BREAST SURGERY PROCEDURE UNLISTED      lumpectomy-2003   ??? HX CAROTID ENDARTERECTOMY     ??? HX CRANIOTOMY      2014 -- L temperol lobe    ??? HX CYST REMOVAL      R. Bartolin Cyst-2001   ??? HX HEENT      wisdom teeth -1995       Family History   Problem Relation Age of Onset   ??? Cancer Mother      Breast   ??? Diabetes Mother    ??? Hypertension Mother    ??? Parkinsonism Father    ??? Diabetes Sister    ??? Hypertension Sister    ??? Diabetes Maternal Grandmother        Social History     Social History   ??? Marital status: MARRIED     Spouse name: N/A   ??? Number of children: 1   ??? 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 Smoking status: Never Smoker   ??? Smokeless tobacco: Never Used   ??? Alcohol use No    ??? Drug use: No   ??? Sexual activity: No     Other Topics Concern   ??? Not on file     Social History Narrative       Current Outpatient Prescriptions   Medication Sig Dispense Refill   ??? ascorbic acid, vitamin C, (VITAMIN C) 500 mg tablet Take 500 mg by mouth daily.     ??? cyanocobalamin 1,000 mcg tablet Take 1,000 mcg by mouth daily.     ??? b complex vitamins (B COMPLEX 1) tablet Take 1 Tab by mouth daily.     ??? lamoTRIgine (LAMICTAL XR) 100 mg tr24 ER tablet Take 1 Tab by mouth daily. Indications:  COMPLEX-PARTIAL EPILEPSY 90 Tab 3   ??? levETIRAcetam (KEPPRA XR) 750 mg ER tablet Take 2 Tabs by mouth two (2) times a day. Indications: COMPLEX-PARTIAL EPILEPSY 360 Tab 3   ??? lacosamide (VIMPAT) 150 mg tab tablet Take 1 Tab by mouth two (2) times a day. Max Daily Amount: 300 mg. 180 Tab 3         Allergies   Allergen Reactions   ??? Aptiom [Eslicarbazepine] Seizures   ??? Dilantin [Phenytoin Sodium Extended] Rash   ??? Fycompa [Perampanel] Other (comments)     PATIENT STATES SHE PASSED OUT           Lab Results   Component Value Date/Time    WBC 5.1 08/05/2015 09:10 AM    HGB 12.6 08/05/2015 09:10 AM    HCT 38.2 08/05/2015 09:10 AM    PLATELET 201 08/05/2015 09:10 AM    MCV 88.4 08/05/2015 09:10 AM       Lab Results   Component Value Date/Time    Hemoglobin A1c 5.5 04/14/2014 11:05 AM     Lab Results   Component Value Date/Time    Sodium 147 08/05/2015 09:10 AM    Potassium 4.3 08/05/2015 09:10 AM    Chloride 109 08/05/2015 09:10 AM    CO2 30 08/05/2015 09:10 AM    Anion gap 8 08/05/2015 09:10 AM    Glucose 99 08/05/2015 09:10 AM    BUN 22 08/05/2015 09:10 AM    Creatinine 0.80 08/05/2015 09:10 AM    BUN/Creatinine ratio 28 08/05/2015 09:10 AM    GFR est AA >60 08/05/2015 09:10 AM    GFR est non-AA >60 08/05/2015 09:10 AM    Calcium 9.0 08/05/2015 09:10 AM       Lab Results   Component Value Date/Time    Sodium 147 08/05/2015 09:10 AM    Potassium 4.3 08/05/2015 09:10 AM    Chloride 109 08/05/2015 09:10 AM     CO2 30 08/05/2015 09:10 AM    Anion gap 8 08/05/2015 09:10 AM    Glucose 99 08/05/2015 09:10 AM    BUN 22 08/05/2015 09:10 AM    Creatinine 0.80 08/05/2015 09:10 AM    BUN/Creatinine ratio 28 08/05/2015 09:10 AM    GFR est AA >60 08/05/2015 09:10 AM    GFR est non-AA >60 08/05/2015 09:10 AM    Calcium 9.0 08/05/2015 09:10 AM    Bilirubin, total 0.5 08/05/2015 09:10 AM    AST (SGOT) 12 08/05/2015 09:10 AM    Alk. phosphatase 64 08/05/2015 09:10 AM    Protein, total 7.3 08/05/2015 09:10 AM    Albumin 4.4 08/05/2015 09:10 AM    Globulin 2.9 08/05/2015 09:10 AM    A-G Ratio 1.5 08/05/2015 09:10 AM    ALT (SGPT) 15 08/05/2015 09:10 AM       No results found for: AML  No results found for: LPSE    Objective:      Visit Vitals   ??? BP 116/62   ??? Pulse 66   ??? Resp 20   ??? Ht 6' 1" (1.854 m)   ??? Wt 130 lb (59 kg)   ??? SpO2 97%   ??? BMI 17.15 kg/m2       Physical Exam:  General:  Alert, cooperative, well nourished, well developed, appears stated age   Eyes:  Sclera anicteric.    Mouth/Throat: Mucous membranes normal, oral pharynx clear   Neck: Supple   Lungs:   Clear to auscultation bilaterally, good effort  CV:  Regular rate and rhythm,no murmur, click, rub or gallop   Abdomen:   Soft, non-tender. bowel sounds active. non-distended   Extremities: No cyanosis or edema     Skin:  3 mm raised waxy skin lesion, left medial scapula without adenopathy, supraclavicular, no ulceration      Lymph nodes: Cervical ,supraclavicular, axillary negative for adenopathy   Musculoskeletal: No swelling or deformity   :     Psych: Alert and oriented, normal mood affect given the setting     Data Review images and reports reviewed          Assessment/Plan     Patient Active Problem List   Diagnosis Code   ??? Epilepsy without status epilepticus, not intractable ([place]) G40.909   ??? Depression F32.9         ICD-10-CM ICD-9-CM    1. Skin lesion of chest wall L98.9 709.9    2. Encounter for screening colonoscopy Z12.11 V76.51           1.  Excision skin lesion.  The risk and benefits including bleeding and infection were discussed.  She understands and desires to proceed.  2.    2.  Colonoscopy    The procedure was explained in detail.  Pictures were shown to the patient, and literature was given.  The risks and benefits of endoscopy including bleeding, infection, perforation, hypoxia were discussed and weighed against the benefit of diagnosis and possible treatment of various gastrointestinal conditions were discussed, as well as possible alternatives.  All questions were answered, patient understands the risks and benefits, and desires to proceed.          [name] M.D. F.A.C.S.

## 2016-01-06 NOTE — Progress Notes (Signed)
[facility] SURGICAL SERVICES CASE [place]  Phone:  [phone] Fax:  [phone]  Faxes are considered tentatively scheduled until confirmed by the [facility] scheduling office    Surgeon Name: Dr. Ronnald Ramp  Surgery Date & Time:  01/14/16  Surgery Location: Endo  Patient Name: Natasha Herman  Patient Date Of Birth: May 12, 1966  Patient Social Security: [id number]   Gender:  female  Patient Phone: [phone]  Patient Type: Outpatient  Anesthesia Type: Mac  CPT Code(s):  H7044205      Patient Pre-op diagnosis: Z12.11 screening  Procedure: Colonoscopy  Special Needs: ped scope  Insurance (Primary and Secondary): bc/bs  Insurance Policy #(s):  [id number]      Jilda Panda

## 2016-01-07 ENCOUNTER — Inpatient Hospital Stay: Payer: BLUE CROSS/BLUE SHIELD | Attending: Surgery | Primary: Family Medicine

## 2016-01-10 ENCOUNTER — Inpatient Hospital Stay: Admit: 2016-01-10 | Payer: BLUE CROSS/BLUE SHIELD | Attending: Surgery | Primary: Family Medicine

## 2016-01-10 DIAGNOSIS — Z01818 Encounter for other preprocedural examination: Secondary | ICD-10-CM

## 2016-01-10 LAB — METABOLIC PANEL, BASIC
Anion gap: 6 mmol/L (ref 6–15)
BUN/Creatinine ratio: 18 (ref 7–25)
BUN: 20 MG/DL — ABNORMAL HIGH (ref 7–18)
CO2: 31 mmol/L (ref 21–32)
Calcium: 8.8 MG/DL (ref 8.5–10.1)
Chloride: 105 mmol/L (ref 98–107)
Creatinine: 1.13 MG/DL (ref 0.60–1.30)
GFR est AA: >60 mL/min/{1.73_m2} (ref >60)
GFR est non-AA: 51 mL/min/{1.73_m2} — ABNORMAL LOW (ref >60)
Glucose: 86 mg/dL (ref 70–110)
Potassium: 3.9 mmol/L (ref 3.5–5.3)
Sodium: 142 mmol/L (ref 136–145)

## 2016-01-10 LAB — CBC WITH AUTOMATED DIFF
ABS. BASOPHILS: 0 10*3/uL (ref 0.0–0.1)
ABS. EOSINOPHILS: 0 10*3/uL (ref 0.0–0.5)
ABS. LYMPHOCYTES: 1.5 10*3/uL (ref 0.8–3.5)
ABS. MONOCYTES: 0.4 10*3/uL — ABNORMAL LOW (ref 0.8–3.5)
ABS. NEUTROPHILS: 2.9 10*3/uL (ref 1.5–8.0)
BASOPHILS: 1 % (ref 0–2)
EOSINOPHILS: 1 % (ref 0–5)
HCT: 37.9 % — ABNORMAL LOW (ref 41–53)
HGB: 12.5 g/dL (ref 12.0–16.0)
LYMPHOCYTES: 31 % (ref 19–48)
MCH: 28.8 PG (ref 27–31)
MCHC: 33 g/dL (ref 31–37)
MCV: 87.3 FL (ref 80–100)
MONOCYTES: 7 % (ref 3–9)
MPV: 11.2 FL — ABNORMAL HIGH (ref 5.9–10.3)
NEUTROPHILS: 60 % (ref 40–74)
PLATELET: 199 10*3/uL (ref 130–400)
RBC: 4.34 M/uL (ref 4.2–5.4)
RDW: 12.4 % (ref 11.5–14.5)
WBC: 4.9 10*3/uL (ref 4.5–10.8)

## 2016-01-10 LAB — EKG, 12 LEAD, INITIAL
Atrial Rate: 67 {beats}/min
Calculated P Axis: 60 degrees
Calculated R Axis: 75 degrees
Calculated T Axis: 76 degrees
P-R Interval: 110 ms
Q-T Interval: 406 ms
QRS Duration: 90 ms
QTC Calculation (Bezet): 429 ms
Ventricular Rate: 67 {beats}/min

## 2016-01-10 LAB — EKG 12-LEAD
Atrial Rate: 67 {beats}/min
P Axis: 60 degrees
P-R Interval: 110 ms
Q-T Interval: 406 ms
QRS Duration: 90 ms
QTc Calculation (Bazett): 429 ms
R Axis: 75 degrees
T Axis: 76 degrees
Ventricular Rate: 67 {beats}/min

## 2016-01-10 NOTE — Other (Signed)
Name, date of birth, procedure verified with patient in PAT.

## 2016-01-11 NOTE — Progress Notes (Signed)
[facility] SURGICAL SERVICES CASE [place]  Phone:  [phone]               Fax:  [phone]  Faxes are considered tentatively scheduled until confirmed by the [facility] scheduling office  ??  Surgeon Name: Dr. Ronnald Ramp  Surgery Date & Time:  01/17/16  Surgery Location: SDS  Patient Name: Natasha Herman  Patient Date Of Birth: 1965/08/29  Patient Social Security: [id number]   Gender:  female  Patient Phone: [phone]  Patient Type: Outpatient  Anesthesia Type: Local  CPT Code(s): B2193296  ??  ??  Patient Pre-op diagnosis: L98.9 skin lesion chest wall  Procedure: skin lesion excision chest wall  Special Needs: none  Insurance (Primary and Secondary): bc/bs  Set designer #(s):  [id number]  ??  ??  [employer]

## 2016-01-14 ENCOUNTER — Inpatient Hospital Stay: Payer: BLUE CROSS/BLUE SHIELD

## 2016-01-14 LAB — HM COLONOSCOPY

## 2016-01-14 MED ORDER — FENTANYL CITRATE (PF) 50 MCG/ML IJ SOLN
50 mcg/mL | INTRAMUSCULAR | Status: DC | PRN
Start: 2016-01-14 — End: 2016-01-14

## 2016-01-14 MED ORDER — LACTATED RINGERS IV
INTRAVENOUS | Status: DC
Start: 2016-01-14 — End: 2016-01-14
  Administered 2016-01-14: 12:00:00 via INTRAVENOUS

## 2016-01-14 MED ORDER — MIDAZOLAM 1 MG/ML IJ SOLN
1 mg/mL | Freq: Once | INTRAMUSCULAR | Status: AC
Start: 2016-01-14 — End: 2016-01-14
  Administered 2016-01-14: 13:00:00 via INTRAVENOUS

## 2016-01-14 MED ORDER — SODIUM CHLORIDE 0.9 % IJ SYRG
Freq: Three times a day (TID) | INTRAMUSCULAR | Status: DC
Start: 2016-01-14 — End: 2016-01-14

## 2016-01-14 MED ORDER — PROPOFOL 10 MG/ML IV EMUL
10 mg/mL | INTRAVENOUS | Status: AC
Start: 2016-01-14 — End: ?

## 2016-01-14 MED ORDER — FLUMAZENIL 0.1 MG/ML IV SOLN
0.1 mg/mL | INTRAVENOUS | Status: DC | PRN
Start: 2016-01-14 — End: 2016-01-14

## 2016-01-14 MED ORDER — ONDANSETRON (PF) 4 MG/2 ML INJECTION
4 mg/2 mL | Freq: Once | INTRAMUSCULAR | Status: AC
Start: 2016-01-14 — End: 2016-01-14
  Administered 2016-01-14: 13:00:00 via INTRAVENOUS

## 2016-01-14 MED ORDER — SODIUM CHLORIDE 0.9 % IJ SYRG
INTRAMUSCULAR | Status: DC | PRN
Start: 2016-01-14 — End: 2016-01-14

## 2016-01-14 MED ORDER — NALOXONE 0.4 MG/ML INJECTION
0.4 mg/mL | INTRAMUSCULAR | Status: DC | PRN
Start: 2016-01-14 — End: 2016-01-14

## 2016-01-14 MED ORDER — PROPOFOL 10 MG/ML IV EMUL
10 mg/mL | INTRAVENOUS | Status: DC | PRN
Start: 2016-01-14 — End: 2016-01-14
  Administered 2016-01-14: 13:00:00 via INTRAVENOUS

## 2016-01-14 MED FILL — NORMAL SALINE FLUSH 0.9 % INJECTION SYRINGE: INTRAMUSCULAR | Qty: 10

## 2016-01-14 MED FILL — DIPRIVAN 10 MG/ML INTRAVENOUS EMULSION: 10 mg/mL | INTRAVENOUS | Qty: 20

## 2016-01-14 MED FILL — MIDAZOLAM 1 MG/ML IJ SOLN: 1 mg/mL | INTRAMUSCULAR | Qty: 2

## 2016-01-14 MED FILL — ONDANSETRON (PF) 4 MG/2 ML INJECTION: 4 mg/2 mL | INTRAMUSCULAR | Qty: 2

## 2016-01-14 NOTE — Op Note (Signed)
Op Notes by Dewitt Hoes A at 01/14/16 [id number]                Author: Dewitt Hoes A  Service: SURGERY  Author Type: Physician       Filed: 01/14/16 0928  Date of Service: 01/14/16 0927  Status: Signed          Editor: Mellody Dance, M.D., F.A.C.S.   GENERAL SURGERY   [place] [address]   [phone]   FAX: [phone]               Operative Note         Patient: Natasha Herman               Sex: female             MRN: [id number]        Date of Birth:  02-08-1966      Age:  50 y.o.                Preoperative Diagnosis: Colon cancer screening [Z12.11]      Postoperative Diagnosis:  normal         Surgeon: Surgeon(s) and Role:      * [name], MD - Primary      Anesthesia:  MAC      Procedure:  Procedure(s):   COLONOSCOPY      Estimated Blood Loss: minimal                    Implants: * No implants in log *      Specimens: * No specimens in log *       Drains: None             Complications:  None                   HISTORY OF PRESENT ILLNESS/INDICATION FOR PROCEDURE:    This patient presents with need for lower endoscopy. The risk and    benefits including bleeding, infection, perforation, inability to    visualize the entire colon, pelvic sepsis and urinary retention were    discussed. Patient understands and desires to proceed.       OPERATION:      The patient was evaluated in the preoperative holding area, and a surgical update was performed, procedure and allergies were confirmed with the patient and nurse.  The patient was taken to the endoscopy suit.      The surgical timeout was performed.   After adequate IV sedation was obtained, the patient was placed in the    left lateral decubitus position.      The colonoscope was then inserted and taken to the level of the cecum    without difficulty. The cecum was identified via landmarks, and the SLD          Cecum:            No mass     Ascending:      No mass    Transverse:    No mass    Descending:   No mass    Sigmoid :         No mass    Rectum:  No mass    Anal canal:       No mass         The scope was then withdrawn. Prep quality was adequate .    The patient tolerated the procedure without difficulty and was    transported to the postop Recovery Room in good condition.       RECOMMEND:    REPEAT ENDOSCOPY IN 10 years.               [name] M.D. F.A.C.S.

## 2016-01-14 NOTE — Anesthesia Pre-Procedure Evaluation (Signed)
Anesthetic History   No history of anesthetic complications            Review of Systems / Medical History  Patient summary reviewed, nursing notes reviewed and pertinent labs reviewed    Pulmonary  Within defined limits                 Neuro/Psych   Within defined limits  seizures (Has 30-50 seizures per month (makes her feel weak). Has one this am. No lingering effects.): well controlled      Pertinent negatives: No psychiatric history   Cardiovascular  Within defined limits                Exercise tolerance: >4 METS     GI/Hepatic/Renal  Within defined limits              Endo/Other  Within defined limits           Other Findings              Physical Exam    Airway  Mallampati: II  TM Distance: 4 - 6 cm  Neck ROM: normal range of motion   Mouth opening: Normal     Cardiovascular    Rhythm: regular  Rate: normal         Dental  No notable dental hx       Pulmonary  Breath sounds clear to auscultation               Abdominal  GI exam deferred       Other Findings            Anesthetic Plan    ASA: 2  Anesthesia type: MAC          Induction: Intravenous

## 2016-01-14 NOTE — Anesthesia Post-Procedure Evaluation (Signed)
Post-Anesthesia Evaluation and Assessment    Patient: Natasha Herman MRN: [id number]  SSN: [id number]    Date of Birth: 1965/10/09  Age: 50 y.o.  Sex: female       Cardiovascular Function/Vital Signs  Visit Vitals   ??? BP 96/59   ??? Pulse 61   ??? Temp 36.5 ??C (97.7 ??F)   ??? Resp 11   ??? Ht 6\' 1"  (1.854 m)   ??? Wt 59 kg (130 lb)   ??? SpO2 100%   ??? Breastfeeding No   ??? BMI 17.15 kg/m2       Patient is status post MAC anesthesia for Procedure(s):  COLONOSCOPY.    Nausea/Vomiting: None    Postoperative hydration reviewed and adequate.    Pain:  Pain Scale 1: Visual (01/14/16 0927)  Pain Intensity 1: 0 (01/14/16 0927)   Managed    Neurological Status:       At baseline    Mental Status and Level of Consciousness: Arousable    Pulmonary Status:   O2 Device: Oxygen mask (01/14/16 0927)   Adequate oxygenation and airway patent    Complications related to anesthesia: None    Post-anesthesia assessment completed. No concerns    Signed By: Corky Sing, MD     January 14, 2016

## 2016-01-14 NOTE — Op Note (Signed)
[name], M.D., F.A.C.S.  GENERAL SURGERY  [place] [id number]  [phone]  FAX: [phone]          Operative Note      Patient: Natasha Herman               Sex: female             MRN: [id number]      Date of Birth:  [birth year]      Age:  50 y.o.              Preoperative Diagnosis: Colon cancer screening [Z12.11]    Postoperative Diagnosis:  normal      Surgeon: Surgeon(s) and Role:     * [name], MD - Primary    Anesthesia:  MAC    Procedure:  Procedure(s):  COLONOSCOPY    Estimated Blood Loss: minimal                 Implants: * No implants in log *    Specimens: * No specimens in log *     Drains: None           Complications:  None               HISTORY OF PRESENT ILLNESS/INDICATION FOR PROCEDURE:   This patient presents with need for lower endoscopy. The risk and   benefits including bleeding, infection, perforation, inability to   visualize the entire colon, pelvic sepsis and urinary retention were   discussed. Patient understands and desires to proceed.     OPERATION:    The patient was evaluated in the preoperative holding area, and a surgical update was performed, procedure and allergies were confirmed with the patient and nurse.  The patient was taken to the endoscopy suit.    The surgical timeout was performed.  After adequate IV sedation was obtained, the patient was placed in the   left lateral decubitus position.    The colonoscope was then inserted and taken to the level of the cecum   without difficulty. The cecum was identified via landmarks, and the SLD       Cecum:            No mass   Ascending:      No mass   Transverse:    No mass   Descending:   No mass   Sigmoid :         No mass   Rectum:           No mass   Anal canal:       No mass      The scope was then withdrawn. Prep quality was adequate.   The patient tolerated the procedure without difficulty and was    transported to the postop Recovery Room in good condition.     RECOMMEND:   REPEAT ENDOSCOPY IN 10 years.          [name] M.D. F.A.C.S.

## 2016-01-14 NOTE — H&P (View-Only) (Signed)
Expand All Collapse All    Hide copied text  Hover for attribution information    ????  ??  ??  ??  ??  ??  ??  ??  [name], M.D., F.A.C.S.  GENERAL SURGERY  [place] [address]  [phone]  FAX: [phone]  ??  ??  ??  ??  OFFICE EVALUATION  ??  ??  No ref. provider found  ??  PCP:  Hermenia Bers, DO  ??  Patient: Natasha Herman               Sex: female             MRN: [id number]  ????  Date of Birth:  [birth year]      Age:  50 y.o.            ??  Subjective:   ??  Natasha Herman is a 50 y.o. female who presents for evaluation for screening colonoscopy.  ??  Additional complaints include waxy raised skin lesion left medial scapular.  ??  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  ??  Review of Systems  A comprehensive review of systems was negative except for that written in the History of Present Illness.  Please see health history survey, noted family history of breast cancer in mother, also allergy to "pink liquid" taken as an infant, and the  ????  ??  ??       Past Medical History:   Diagnosis Date   ??? Depression ??   ??? Seizures ([place]) ??   ??  ??        Past Surgical History:   Procedure Laterality Date   ??? BREAST SURGERY PROCEDURE UNLISTED ?? ??   ?? lumpectomy-2003   ??? HX CAROTID ENDARTERECTOMY ?? ??   ??? HX CRANIOTOMY ?? ??   ?? 2014 -- L temperol lobe    ??? HX CYST REMOVAL ?? ??   ?? R. Bartolin Cyst-2001   ??? HX HEENT ?? ??   ?? wisdom teeth -1995   ??  ??         Family History   Problem Relation Age of Onset   ??? Cancer Mother ??   ?? ?? Breast   ??? Diabetes Mother ??   ??? Hypertension Mother ??   ??? Parkinsonism Father ??   ??? Diabetes Sister ??   ??? Hypertension Sister ??   ??? Diabetes Maternal Grandmother ??   ??  ??   Social History       ??        Social History   ??? Marital status: MARRIED   ?? ?? Spouse name: N/A   ??? Number of children: 1    ??? Years of education: N/A   ??       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   ??       Social History Main Topics   ??? Smoking status: Never Smoker   ??? Smokeless tobacco: Never Used   ??? Alcohol use No   ??? Drug use: No   ??? Sexual activity: No   ??       Other Topics Concern   ??? Not on file   ??  Social History Narrative      ??  ??  Current Outpatient Prescriptions   Medication Sig Dispense Refill   ??? ascorbic acid, vitamin C, (VITAMIN C) 500 mg tablet Take 500 mg by mouth daily. ?? ??   ??? cyanocobalamin 1,000 mcg tablet Take 1,000 mcg by mouth daily. ?? ??   ??? b complex vitamins (B COMPLEX 1) tablet Take 1 Tab by mouth daily. ?? ??   ??? lamoTRIgine (LAMICTAL XR) 100 mg tr24 ER tablet Take 1 Tab by mouth daily. Indications: COMPLEX-PARTIAL EPILEPSY 90 Tab 3   ??? levETIRAcetam (KEPPRA XR) 750 mg ER tablet Take 2 Tabs by mouth two (2) times a day. Indications: COMPLEX-PARTIAL EPILEPSY 360 Tab 3   ??? lacosamide (VIMPAT) 150 mg tab tablet Take 1 Tab by mouth two (2) times a day. Max Daily Amount: 300 mg. 180 Tab 3   ??  ??  ??        Allergies   Allergen Reactions   ??? Aptiom [Eslicarbazepine] Seizures   ??? Dilantin [Phenytoin Sodium Extended] Rash   ??? Fycompa [Perampanel] Other (comments)   ?? ?? PATIENT STATES SHE PASSED OUT   ??  ??  ??  ??        Lab Results   Component Value Date/Time   ?? WBC 5.1 08/05/2015 09:10 AM   ?? HGB 12.6 08/05/2015 09:10 AM   ?? HCT 38.2 08/05/2015 09:10 AM   ?? PLATELET 201 08/05/2015 09:10 AM   ?? MCV 88.4 08/05/2015 09:10 AM   ??  ??        Lab Results   Component Value Date/Time   ?? Hemoglobin A1c 5.5 04/14/2014 11:05 AM   ??  Lab Results   Component Value Date/Time   ?? Sodium 147 08/05/2015 09:10 AM   ?? Potassium 4.3 08/05/2015 09:10 AM   ?? Chloride 109 08/05/2015 09:10 AM   ?? CO2 30 08/05/2015 09:10 AM   ?? Anion gap 8 08/05/2015 09:10 AM   ?? Glucose 99 08/05/2015 09:10 AM   ?? BUN 22 08/05/2015 09:10 AM   ?? Creatinine 0.80 08/05/2015 09:10 AM   ?? BUN/Creatinine ratio 28 08/05/2015 09:10 AM    ?? GFR est AA >60 08/05/2015 09:10 AM   ?? GFR est non-AA >60 08/05/2015 09:10 AM   ?? Calcium 9.0 08/05/2015 09:10 AM   ??  ??        Lab Results   Component Value Date/Time   ?? Sodium 147 08/05/2015 09:10 AM   ?? Potassium 4.3 08/05/2015 09:10 AM   ?? Chloride 109 08/05/2015 09:10 AM   ?? CO2 30 08/05/2015 09:10 AM   ?? Anion gap 8 08/05/2015 09:10 AM   ?? Glucose 99 08/05/2015 09:10 AM   ?? BUN 22 08/05/2015 09:10 AM   ?? Creatinine 0.80 08/05/2015 09:10 AM   ?? BUN/Creatinine ratio 28 08/05/2015 09:10 AM   ?? GFR est AA >60 08/05/2015 09:10 AM   ?? GFR est non-AA >60 08/05/2015 09:10 AM   ?? Calcium 9.0 08/05/2015 09:10 AM   ?? Bilirubin, total 0.5 08/05/2015 09:10 AM   ?? AST (SGOT) 12 08/05/2015 09:10 AM   ?? Alk. phosphatase 64 08/05/2015 09:10 AM   ?? Protein, total 7.3 08/05/2015 09:10 AM   ?? Albumin 4.4 08/05/2015 09:10 AM   ?? Globulin 2.9 08/05/2015 09:10 AM   ?? A-G Ratio 1.5 08/05/2015 09:10 AM   ?? ALT (SGPT) 15 08/05/2015 09:10 AM   ??  ??  No results found for: AML  No results found for: LPSE  ??  Objective:   ????       Visit Vitals   ??? BP 116/62   ??? Pulse 66   ??? Resp 20   ??? Ht _0  (1.854 m)   ??? Wt 130 lb (59 kg)   ??? SpO2 97%   ??? BMI 17.15 kg/m2   ??  ??  Physical Exam:  General:  Alert, cooperative, well nourished, well developed, appears stated age   Eyes:  Sclera anicteric.    Mouth/Throat: Mucous membranes normal, oral pharynx clear   Neck: Supple   Lungs:   Clear to auscultation bilaterally, good effort   CV:  Regular rate and rhythm,no murmur, click, rub or gallop   Abdomen:   Soft, non-tender. bowel sounds active. non-distended   Extremities: No cyanosis or edema   Skin:  3 mm raised waxy skin lesion, left medial scapula without adenopathy, supraclavicular, no ulceration  ????   Lymph nodes: Cervical ,supraclavicular, axillary negative for adenopathy   Musculoskeletal: No swelling or deformity   :  ??   Psych: Alert and oriented, normal mood affect given the setting   ??  Data Review images and reports reviewed  ??  ??  ??   ??  Assessment/Plan   ??       Patient Active Problem List   Diagnosis Code   ??? Epilepsy without status epilepticus, not intractable ([place]) G40.909   ??? Depression F32.9   ??  ??  ?? ?? ICD-10-CM ICD-9-CM ??   1. Skin lesion of chest wall L98.9 709.9 ??   2. Encounter for screening colonoscopy Z12.11 V76.51 ??   ??  ??  ??  1.  Excision skin lesion.  The risk and benefits including bleeding and infection were discussed.  She understands and desires to proceed.  2.  ??  2.  Colonoscopy  ??  The procedure was explained in detail.  Pictures were shown to the patient, and literature was given.  The risks and benefits of endoscopy including bleeding, infection, perforation, hypoxia were discussed and weighed against the benefit of diagnosis and possible treatment of various gastrointestinal conditions were discussed, as well as possible alternatives.  All questions were answered, patient understands the risks and benefits, and desires to proceed.          [name] M.D. F.A.C.S.  ??  ??  ??

## 2016-01-14 NOTE — H&P (Signed)
Expand All Collapse All    Hide copied text  Hover for attribution information    ????  ??  ??  ??  ??  ??  ??  ??  Satoria Dunlop A. Shanera Meske, M.D., F.A.C.S.  GENERAL SURGERY  [address]  [facility] [place] [address]  [phone]  FAX: [phone]  ??  ??  ??  ??  OFFICE EVALUATION  ??  ??  No ref. provider found  ??  PCP:  Lauren E Miller, DO  ??  Patient: Natasha Herman               Sex: female             MRN: [id number]  ????  Date of Birth:  02/12/1966      Age:  50 y.o.            ??  Subjective:   ??  Natasha Herman is a 50 y.o. female who presents for evaluation for screening colonoscopy.  ??  Additional complaints include waxy raised skin lesion left medial scapular.  ??  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  ??  Review of Systems  A comprehensive review of systems was negative except for that written in the History of Present Illness.  Please see health history survey, noted family history of breast cancer in mother, also allergy to "pink liquid" taken as an infant, and the  ????  ??  ??       Past Medical History:   Diagnosis Date   ??? Depression ??   ??? Seizures ([facility]) ??   ??  ??        Past Surgical History:   Procedure Laterality Date   ??? BREAST SURGERY PROCEDURE UNLISTED ?? ??   ?? lumpectomy-2003   ??? HX CAROTID ENDARTERECTOMY ?? ??   ??? HX CRANIOTOMY ?? ??   ?? 2014 -- L temperol lobe    ??? HX CYST REMOVAL ?? ??   ?? R. Bartolin Cyst-2001   ??? HX HEENT ?? ??   ?? wisdom teeth -1995   ??  ??         Family History   Problem Relation Age of Onset   ??? Cancer Mother ??   ?? ?? Breast   ??? Diabetes Mother ??   ??? Hypertension Mother ??   ??? Parkinsonism Father ??   ??? Diabetes Sister ??   ??? Hypertension Sister ??   ??? Diabetes Maternal Grandmother ??   ??  ??   Social History       ??        Social History   ??? Marital status: MARRIED   ?? ?? Spouse name: N/A   ??? Number of children: 1    ??? Years of education: N/A   ??       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   ??       Social History Main Topics   ??? Smoking status: Never Smoker   ??? Smokeless tobacco: Never Used   ??? Alcohol use No   ??? Drug use: No   ??? Sexual activity: No   ??       Other Topics Concern   ??? Not on file   ??  Social History Narrative      ??  ??           Current Outpatient Prescriptions   Medication Sig Dispense Refill   ??? ascorbic acid, vitamin C, (VITAMIN C) 500 mg tablet Take 500 mg by mouth daily. ?? ??   ??? cyanocobalamin 1,000 mcg tablet Take 1,000 mcg by mouth daily. ?? ??   ??? b complex vitamins (B COMPLEX 1) tablet Take 1 Tab by mouth daily. ?? ??   ??? lamoTRIgine (LAMICTAL XR) 100 mg tr24 ER tablet Take 1 Tab by mouth daily. Indications: COMPLEX-PARTIAL EPILEPSY 90 Tab 3   ??? levETIRAcetam (KEPPRA XR) 750 mg ER tablet Take 2 Tabs by mouth two (2) times a day. Indications: COMPLEX-PARTIAL EPILEPSY 360 Tab 3   ??? lacosamide (VIMPAT) 150 mg tab tablet Take 1 Tab by mouth two (2) times a day. Max Daily Amount: 300 mg. 180 Tab 3   ??  ??  ??        Allergies   Allergen Reactions   ??? Aptiom [Eslicarbazepine] Seizures   ??? Dilantin [Phenytoin Sodium Extended] Rash   ??? Fycompa [Perampanel] Other (comments)   ?? ?? PATIENT STATES SHE PASSED OUT   ??  ??  ??  ??        Lab Results   Component Value Date/Time   ?? WBC 5.1 08/05/2015 09:10 AM   ?? HGB 12.6 08/05/2015 09:10 AM   ?? HCT 38.2 08/05/2015 09:10 AM   ?? PLATELET 201 08/05/2015 09:10 AM   ?? MCV 88.4 08/05/2015 09:10 AM   ??  ??        Lab Results   Component Value Date/Time   ?? Hemoglobin A1c 5.5 04/14/2014 11:05 AM   ??  Lab Results   Component Value Date/Time   ?? Sodium 147 08/05/2015 09:10 AM   ?? Potassium 4.3 08/05/2015 09:10 AM   ?? Chloride 109 08/05/2015 09:10 AM   ?? CO2 30 08/05/2015 09:10 AM   ?? Anion gap 8 08/05/2015 09:10 AM   ?? Glucose 99 08/05/2015 09:10 AM   ?? BUN 22 08/05/2015 09:10 AM   ?? Creatinine 0.80 08/05/2015 09:10 AM   ?? BUN/Creatinine ratio 28 08/05/2015 09:10 AM    ?? GFR est AA >60 08/05/2015 09:10 AM   ?? GFR est non-AA >60 08/05/2015 09:10 AM   ?? Calcium 9.0 08/05/2015 09:10 AM   ??  ??        Lab Results   Component Value Date/Time   ?? Sodium 147 08/05/2015 09:10 AM   ?? Potassium 4.3 08/05/2015 09:10 AM   ?? Chloride 109 08/05/2015 09:10 AM   ?? CO2 30 08/05/2015 09:10 AM   ?? Anion gap 8 08/05/2015 09:10 AM   ?? Glucose 99 08/05/2015 09:10 AM   ?? BUN 22 08/05/2015 09:10 AM   ?? Creatinine 0.80 08/05/2015 09:10 AM   ?? BUN/Creatinine ratio 28 08/05/2015 09:10 AM   ?? GFR est AA >60 08/05/2015 09:10 AM   ?? GFR est non-AA >60 08/05/2015 09:10 AM   ?? Calcium 9.0 08/05/2015 09:10 AM   ?? Bilirubin, total 0.5 08/05/2015 09:10 AM   ?? AST (SGOT) 12 08/05/2015 09:10 AM   ?? Alk. phosphatase 64 08/05/2015 09:10 AM   ?? Protein, total 7.3 08/05/2015 09:10 AM   ?? Albumin 4.4 08/05/2015 09:10 AM   ?? Globulin 2.9 08/05/2015 09:10 AM   ?? A-G Ratio 1.5 08/05/2015 09:10 AM   ?? ALT (SGPT) 15 08/05/2015 09:10 AM   ??  ??  No results found for: AML  No results found for: LPSE  ??    Objective:   ????       Visit Vitals   ??? BP 116/62   ??? Pulse 66   ??? Resp 20   ??? Ht 6' 1" (1.854 m)   ??? Wt 130 lb (59 kg)   ??? SpO2 97%   ??? BMI 17.15 kg/m2   ??  ??  Physical Exam:  General:  Alert, cooperative, well nourished, well developed, appears stated age   Eyes:  Sclera anicteric.    Mouth/Throat: Mucous membranes normal, oral pharynx clear   Neck: Supple   Lungs:   Clear to auscultation bilaterally, good effort   CV:  Regular rate and rhythm,no murmur, click, rub or gallop   Abdomen:   Soft, non-tender. bowel sounds active. non-distended   Extremities: No cyanosis or edema   Skin:  3 mm raised waxy skin lesion, left medial scapula without adenopathy, supraclavicular, no ulceration  ????   Lymph nodes: Cervical ,supraclavicular, axillary negative for adenopathy   Musculoskeletal: No swelling or deformity   :  ??   Psych: Alert and oriented, normal mood affect given the setting   ??  Data Review images and reports reviewed  ??  ??  ??   ??  Assessment/Plan   ??       Patient Active Problem List   Diagnosis Code   ??? 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 Depression F32.9   ??  ??  ?? ?? ICD-10-CM ICD-9-CM ??   1. Skin lesion of chest wall L98.9 709.9 ??   2. Encounter for screening colonoscopy Z12.11 V76.51 ??   ??  ??  ??  1.  Excision skin lesion.  The risk and benefits including bleeding and infection were discussed.  She understands and desires to proceed.  2.  ??  2.  Colonoscopy  ??  The procedure was explained in detail.  Pictures were shown to the patient, and literature was given.  The risks and benefits of endoscopy including bleeding, infection, perforation, hypoxia were discussed and weighed against the benefit of diagnosis and possible treatment of various gastrointestinal conditions were discussed, as well as possible alternatives.  All questions were answered, patient understands the risks and benefits, and desires to proceed.          Natasha Herman A. Diandre Merica M.D. F.A.C.S.  ??  ??  ??

## 2016-01-17 ENCOUNTER — Inpatient Hospital Stay: Payer: BLUE CROSS/BLUE SHIELD

## 2016-01-17 MED ORDER — BUPIVACAINE (PF) 0.5 % (5 MG/ML) IJ SOLN
0.5 % (5 mg/mL) | INTRAMUSCULAR | Status: DC | PRN
Start: 2016-01-17 — End: 2016-01-17
  Administered 2016-01-17: 16:00:00

## 2016-01-17 NOTE — Op Note (Signed)
[place]  OPERATIVE REPORT    Name:  Natasha Herman, Natasha Herman    MR #:   [id number]    Account #:  [id number]    DOB:  August 27, 1965    Age:  50Y    Location:  [facility]    Date of Admission:  01/17/2016      OPERATIVE REPORT    SURGERY DATE: 01/17/2016    PREOPERATIVE DIAGNOSIS:  1. Sternal skin lesion at sternal notch   2. Right lower back skin lesion   3. Right medial scapular skin lesion (all lesions are 5 mm, raised,  rounded and waxy in appearance).    POSTOPERATIVE DIAGNOSIS:  1. Sternal skin lesion at sternal notch   2. Right lower back skin lesion   3. Right medial scapular skin lesion (all lesions are 5 mm, raised,  rounded and waxy in appearance).    OPERATION:  Excision skin lesions x3    SURGEON:  Dr. Dewitt Hoes, M.D.   ANES:  Local   EBL:  Minimal   SPECIMENS:  As above to Pathology with stitch marking inferior  DRAINS:  None   COMPLICATIONS:  None     HISTORY OF PRESENT ILLNESS AND INDICATIONS FOR PROCEDURE:   This patient presents with the above mentioned skin lesions for  excision.   The risks and benefits including bleeding and infection  were discussed.  She understands and desires to proceed.     DESCRIPTION OF OPERATIVE PROCEDURE:   A surgical Time Out was performed and the previously marked areas  were identified.  Allergies and procedure were confirmed as well as  timing of possible antibiotics.   Each area was prepped and draped  in the usual sterile fashion. Each area was then excised in a 3:1  ratio after locally anesthetizing with 0.5% Marcaine to include  subcutaneous fat.   Each skin area was ensured with irrigation and  no active hemorrhage.  The skin at each site was then reapproximated  using 4-0 Nylon suture.    Sterile dressings were applied.   The  sponge, needle and instrument count were correct x3 prior to wound  closure.   The patient tolerated the procedure without difficulty  and was transported to the postop Recovery Room in good condition.                TJ:kls DD: 01/17/2016 12:57:52  DT: 01/18/2016 10:16:24 Job ID:  [id number]  CC:

## 2016-01-17 NOTE — Progress Notes (Signed)
Path reviewed, Patient will be notified of results.  A. ?? ?? ?? ?? ??Skin, sternal notch lesion, excision:  1. ?? ?? ?? ?? ??Seborrheic keratosis.  2. ?? ?? Negative for malignancy.     B. ?? ?? ?? ?? ??Skin, lower back lesion, excision:  1. ?? ?? ?? ?? ??Seborrheic keratosis.  2. ?? ?? Negative for malignancy.     C. ?? ?? ?? ?? ??Skin, medial scapular lesion, excision:  ???? ?? 1. ??Intradermal nevus.   2. ??Negative for malignancy. ??

## 2016-01-17 NOTE — Progress Notes (Signed)
Patient notified

## 2016-01-17 NOTE — Interval H&P Note (Signed)
H&P Update:  Natasha Herman was seen and examined.  History and physical has been reviewed. The patient has been examined. There have been no significant clinical changes since the completion of the originally dated History and Physical.  THREE AREAS MARKED, STERNAL, RIGHT INFRASCAPULAR AND LEFT UPPER MEDIAL SCAPULAR    Signed By: [name], MD     January 17, 2016 11:23 AM

## 2016-01-17 NOTE — Op Note (Signed)
[facility]  OPERATIVE REPORT    Name:  Natasha Herman, Natasha Herman    MR #:   [id number]    Account #:  [id number]    DOB:  [birth year]    Age:  50Y    Location:  [facility]    Date of Admission:  01/17/2016      OPERATIVE REPORT    SURGERY DATE: 01/17/2016    PREOPERATIVE DIAGNOSIS:  1. Sternal skin lesion at sternal notch   2. Right lower back skin lesion   3. Right medial scapular skin lesion (all lesions are 5 mm, raised,  rounded and waxy in appearance).    POSTOPERATIVE DIAGNOSIS:  1. Sternal skin lesion at sternal notch   2. Right lower back skin lesion   3. Right medial scapular skin lesion (all lesions are 5 mm, raised,  rounded and waxy in appearance).    OPERATION:  Excision skin lesions x3    SURGEON:  Dr. Dewitt Hoes, M.D.   ANES:  Local   EBL:  Minimal   SPECIMENS:  As above to Pathology with stitch marking inferior  DRAINS:  None   COMPLICATIONS:  None     HISTORY OF PRESENT ILLNESS AND INDICATIONS FOR PROCEDURE:   This patient presents with the above mentioned skin lesions for  excision.   The risks and benefits including bleeding and infection  were discussed.  She understands and desires to proceed.     DESCRIPTION OF OPERATIVE PROCEDURE:   A surgical Time Out was performed and the previously marked areas  were identified.  Allergies and procedure were confirmed as well as  timing of possible antibiotics.   Each area was prepped and draped  in the usual sterile fashion. Each area was then excised in a 3:1  ratio after locally anesthetizing with 0.5% Marcaine to include  subcutaneous fat.   Each skin area was ensured with irrigation and  no active hemorrhage.  The skin at each site was then reapproximated  using 4-0 Nylon suture.    Sterile dressings were applied.   The  sponge, needle and instrument count were correct x3 prior to wound  closure.   The patient tolerated the procedure without difficulty  and was transported to the postop Recovery Room in good condition.                TJ:kls DD: 01/17/2016 12:57:52  DT: 01/18/2016 10:16:24 Job ID:  [id number]  CC:

## 2016-01-17 NOTE — Brief Op Note (Signed)
BRIEF OPERATIVE NOTE    Date of Procedure: 01/17/2016   Preoperative Diagnosis: skin lesion of chest, right back and right medial scapular  Postoperative Diagnosis:    Procedure(s):  SKIN LESION EXCISION x 3 (STERNAL NOTCH, right LOWER BACK, AND MEDIAL SCAPULAR)  Surgeon(s) and Role:     * [name], MD - Primary         Assistant Staff:       Surgical Staff:  Circ-1: Tamala Julian, RN  Circ-2: Julious Oka, RN  Scrub Tech-1: Ihor Gully  Event Time In   Incision Start 1224   Incision Close 1253     Anesthesia: Local   Estimated Blood Loss: minimal  Specimens:   ID Type Source Tests Collected by Time Destination   1 : SKIN LESION STERNAL [place] Other                  [name], MD 01/17/2016 1227 Pathology   2 : LOWER BACK SKIN LESION *STITCH [facility] INFERIOR Preservative Other                  [name], MD 01/17/2016 1242 Pathology   3 : MEDIAL SCAPULAR SKIN LESION [facility] MARKS INFERIOR Preservative Other                  [name], MD 01/17/2016 1243 Pathology      Findings: 40mm waxy oval shaped skin lesion x 3 (sternal notch, right lower back, right medial scapular)     Complications: none  Implants: * No implants in log *    [id number]        [name] M.D. F.A.C.S.

## 2016-01-17 NOTE — Interval H&P Note (Signed)
H&P Update:  Natasha Herman was seen and examined.  History and physical has been reviewed. The patient has been examined. There have been no significant clinical changes since the completion of the originally dated History and Physical.    Signed By: [name], MD     January 17, 2016 11:16 AM

## 2016-02-01 ENCOUNTER — Ambulatory Visit
Admit: 2016-02-01 | Discharge: 2016-02-01 | Payer: PRIVATE HEALTH INSURANCE | Attending: Surgery | Primary: Family Medicine

## 2016-02-01 DIAGNOSIS — Z09 Encounter for follow-up examination after completed treatment for conditions other than malignant neoplasm: Secondary | ICD-10-CM

## 2016-02-01 NOTE — Progress Notes (Signed)
[name], M.D., F.A.C.S.  GENERAL SURGERY  [place] [address]  [phone]  FAX: [phone]            No ref. provider found  Referring Provider:No referring provider defined for this encounter.  PCP: Hermenia Bers, DO  Patient: Natasha Herman               Sex: female             MRN: [id number]     Date of Birth:  10/21/1965      Age:  50 y.o.     Chief Complaint:  Chief Complaint   Patient presents with   ??? Follow-up         Procedure:    Removal skin lesion times multiple benign      Subjective:     Patient has no new complaints.          Review of Systems  A comprehensive review of systems was negative.       Meds:   Current Outpatient Prescriptions   Medication Sig Dispense Refill   ??? ascorbic acid, vitamin C, (VITAMIN C) 500 mg tablet Take 500 mg by mouth daily.     ??? cyanocobalamin 1,000 mcg tablet Take 1,000 mcg by mouth daily.     ??? b complex vitamins (B COMPLEX 1) tablet Take 1 Tab by mouth daily.     ??? lamoTRIgine (LAMICTAL XR) 100 mg tr24 ER tablet Take 1 Tab by mouth daily. Indications: COMPLEX-PARTIAL EPILEPSY 90 Tab 3   ??? levETIRAcetam (KEPPRA XR) 750 mg ER tablet Take 2 Tabs by mouth two (2) times a day. Indications: COMPLEX-PARTIAL EPILEPSY 360 Tab 3   ??? lacosamide (VIMPAT) 150 mg tab tablet Take 1 Tab by mouth two (2) times a day. Max Daily Amount: 300 mg. 180 Tab 3         Objective:     Visit Vitals   ??? BP 122/60 (BP 1 Location: Left arm, BP Patient Position: Sitting)   ??? Wt 130 lb (59 kg)   ??? BMI 17.15 kg/m2             Physical Exam:       Incisions approximated without signs or symptoms of infection      Labs:   Lab Results   Component Value Date/Time    WBC 4.9 01/10/2016 02:05 PM    HGB 12.5 01/10/2016 02:05 PM    HCT 37.9 01/10/2016 02:05 PM    PLATELET 199 01/10/2016 02:05 PM    MCV 87.3 01/10/2016 02:05 PM       Lab Results   Component Value Date/Time    Hemoglobin A1c 5.5 04/14/2014 11:05 AM      Lab Results   Component Value Date/Time    Sodium 142 01/10/2016 02:05 PM    Potassium 3.9 01/10/2016 02:05 PM    Chloride 105 01/10/2016 02:05 PM    CO2 31 01/10/2016 02:05 PM    Anion gap 6 01/10/2016 02:05 PM    Glucose 86 01/10/2016 02:05 PM    BUN 20 01/10/2016 02:05 PM    Creatinine 1.13 01/10/2016 02:05 PM    BUN/Creatinine ratio 18 01/10/2016 02:05 PM    GFR est AA >60 01/10/2016 02:05 PM    GFR est non-AA 51 01/10/2016 02:05 PM    Calcium 8.8 01/10/2016 02:05 PM       Lab Results  Component Value Date/Time    Sodium 142 01/10/2016 02:05 PM    Potassium 3.9 01/10/2016 02:05 PM    Chloride 105 01/10/2016 02:05 PM    CO2 31 01/10/2016 02:05 PM    Anion gap 6 01/10/2016 02:05 PM    Glucose 86 01/10/2016 02:05 PM    BUN 20 01/10/2016 02:05 PM    Creatinine 1.13 01/10/2016 02:05 PM    BUN/Creatinine ratio 18 01/10/2016 02:05 PM    GFR est AA >60 01/10/2016 02:05 PM    GFR est non-AA 51 01/10/2016 02:05 PM    Calcium 8.8 01/10/2016 02:05 PM    Bilirubin, total 0.5 08/05/2015 09:10 AM    AST (SGOT) 12 08/05/2015 09:10 AM    Alk. phosphatase 64 08/05/2015 09:10 AM    Protein, total 7.3 08/05/2015 09:10 AM    Albumin 4.4 08/05/2015 09:10 AM    Globulin 2.9 08/05/2015 09:10 AM    A-G Ratio 1.5 08/05/2015 09:10 AM    ALT (SGPT) 15 08/05/2015 09:10 AM       No results found for: AML  No results found for: LPSE          Data Review   images and reports reviewed       Assessment:     Patient Active Problem List   Diagnosis Code   ??? Epilepsy without status epilepticus, not intractable ([place]) G40.909   ??? Depression F32.9         ICD-10-CM ICD-9-CM    1. Postoperative examination Z09 V67.00          Plan/Recommendations/Medical Decision Making:     Sutures removed.  Steri-Strips placed.  Patient released care of PCP.          [name] M.D. F.A.C.S.

## 2016-02-10 ENCOUNTER — Encounter: Attending: Neurology | Primary: Family Medicine

## 2016-03-20 ENCOUNTER — Encounter

## 2016-03-20 MED ORDER — LACOSAMIDE 150 MG TAB
150 mg | ORAL_TABLET | Freq: Two times a day (BID) | ORAL | 3 refills | Status: DC
Start: 2016-03-20 — End: 2016-03-21

## 2016-03-21 ENCOUNTER — Ambulatory Visit
Admit: 2016-03-21 | Discharge: 2016-03-21 | Payer: PRIVATE HEALTH INSURANCE | Attending: Neurology | Primary: Family Medicine

## 2016-03-21 DIAGNOSIS — R569 Unspecified convulsions: Secondary | ICD-10-CM

## 2016-03-21 MED ORDER — LEVETIRACETAM ER 750 MG 24 HR TAB
750 mg | ORAL_TABLET | Freq: Two times a day (BID) | ORAL | 3 refills | Status: DC
Start: 2016-03-21 — End: 2016-09-18

## 2016-03-21 MED ORDER — ESCITALOPRAM 10 MG TAB
10 mg | ORAL_TABLET | Freq: Every day | ORAL | 3 refills | Status: DC
Start: 2016-03-21 — End: 2016-10-19

## 2016-03-21 MED ORDER — LAMOTRIGINE ER 200 MG 24 HR TAB
200 mg | ORAL_TABLET | Freq: Every day | ORAL | 3 refills | Status: DC
Start: 2016-03-21 — End: 2016-07-18

## 2016-03-21 MED ORDER — LACOSAMIDE 150 MG TAB
150 mg | ORAL_TABLET | Freq: Two times a day (BID) | ORAL | 3 refills | Status: DC
Start: 2016-03-21 — End: 2016-07-18

## 2016-03-21 NOTE — Progress Notes (Signed)
Phone call from patient would like to discuss medications with Dr. Glee Arvin; gave pt appt with Dr. Glee Arvin 03/21/2016.  Pt states verbal understanding

## 2016-03-21 NOTE — Progress Notes (Signed)
Chauncy LeanSreekanth Ki Corbo, MD   [address], [address]  [place], [place] [address]  Phone:  [phone]([phone]  Fax:  [phone]) [phone]      Patient ID  Name:  Natasha Herman  DOB:  1966-01-14  MRN:  [id number]  Age:  50 y.o.  PCP:  Donnella BiLauren E Miller, DO    Subjective:     Encounter Date:  03/21/2016    Referring Physician: No ref. provider found    Chief Complaint   Patient presents with   ??? Follow-up     needs to discuss medicaitons       History of Present Illness:     "50 year old female with left temporal lobectomy in 2014  Having partial seizures without altered awareness since then   2/ day seizures frequency did not increase since then  Since she cut down on lexapro she is having one seizure every day and occasionally 3/ day  She had an episodes when she was with for few seconds, with sensory changes on top of the head, right arm numbness  Epigastric sensation. She did not have confusion or altered awareness after the spell    According to her this had electrical correlate on EEG."    "Pt having 6-7 days of seizures freedom followed by cluster of 3 seizures  Pt feels tired after the episode. Feels the aura, no LOC  Did not tolerated the increase in dose of lamictal   Pt husband said video EEG while on medications did not capture present episodes pt having  But according to pt they were captured and these were seizures"      Pt having dejavue and brief spell without LOC for few seconds some times multiple a day and they are increasing in frequency        Current Outpatient Prescriptions on File Prior to Visit   Medication Sig Dispense Refill   ??? lacosamide (VIMPAT) 150 mg tab tablet Take 1 Tab by mouth two (2) times a day. Max Daily Amount: 300 mg. 180 Tab 3   ??? ascorbic acid, vitamin C, (VITAMIN C) 500 mg tablet Take 500 mg by mouth daily.     ??? cyanocobalamin 1,000 mcg tablet Take 1,000 mcg by mouth daily.     ??? b complex vitamins (B COMPLEX 1) tablet Take 1 Tab by mouth daily.      ??? lamoTRIgine (LAMICTAL XR) 100 mg tr24 ER tablet Take 1 Tab by mouth daily. Indications: COMPLEX-PARTIAL EPILEPSY 90 Tab 3   ??? levETIRAcetam (KEPPRA XR) 750 mg ER tablet Take 2 Tabs by mouth two (2) times a day. Indications: COMPLEX-PARTIAL EPILEPSY 360 Tab 3     No current facility-administered medications on file prior to visit.       Allergies   Allergen Reactions   ??? Aptiom [Eslicarbazepine] Seizures   ??? Dilantin [Phenytoin Sodium Extended] Rash   ??? Fycompa [Perampanel] Other (comments)     PATIENT STATES SHE PASSED OUT     Patient Active Problem List   Diagnosis Code   ??? 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 Depression F32.9     Past Medical History:   Diagnosis Date   ??? Depression    ??? Seizures ([facility])     DAILY MORE THAN ONE A DAY      Past Surgical History:   Procedure Laterality Date   ??? BREAST SURGERY PROCEDURE UNLISTED Left     lumpectomy-2003 BENIGN   ??? COLONOSCOPY  N/A 01/14/2016    COLONOSCOPY performed by [name], MD at [facility]   ??? HX CAROTID ENDARTERECTOMY Left    ??? HX CRANIOTOMY      2014 -- L temperol lobe    ??? HX CYST REMOVAL      R. Bartolin Cyst-2001   ??? HX HEENT      wisdom teeth -1995      Family History   Problem Relation Age of Onset   ??? Cancer Mother      Breast   ??? Diabetes Mother    ??? Hypertension Mother    ??? Parkinsonism Father    ??? Diabetes Sister    ??? Hypertension Sister    ??? Diabetes Maternal Grandmother       Social History     Social History   ??? Marital status: MARRIED     Spouse name: N/A   ??? Number of children: 1   ??? 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 Smoking status: Never Smoker   ??? Smokeless tobacco: Never Used   ??? Alcohol use No   ??? Drug use: No   ??? Sexual activity: No     Other Topics Concern   ??? None     Social History Narrative       Review of Systems:  Review of Systems   Constitutional: Negative for fever.   Eyes: Positive for blurred vision.    Respiratory: Negative for cough.    Cardiovascular: Negative for chest pain.   Gastrointestinal: Negative for heartburn.   Genitourinary: Negative for dysuria.   Musculoskeletal: Negative for myalgias.   Skin: Negative for rash.   Neurological: Positive for seizures. Negative for headaches.   Endo/Heme/Allergies: Does not bruise/bleed easily.   Psychiatric/Behavioral: Negative for depression.         Objective:     Vitals:    03/21/16 1557   BP: 111/75   Pulse: 69   Weight: 62.6 kg (138 lb)   Height: 6\' 1"  (1.854 m)       Physical Exam:  Neurologic Exam     Mental Status   Oriented to person, place, and time.     Cranial Nerves   Cranial nerves II through XII intact.     Motor Exam   Muscle bulk: normal  Overall muscle tone: normal    Sensory Exam   Light touch normal.           Impression:     50 year old female with   Left temporal lobectomy  Very few sec spells or sensory changes  Without altered awareness happening since 2 years with increasing frequency      Pt having periods of spell remission followed by cluster of spells   This could be aura vs complex partial seizures  Pt worried that her vitamin and mineral levels causing her seizures  Plan:   1. Seizures ([place])    - MAGNESIUM; Future  - ZINC; Future  - SELENIUM, S/P; Future  - VITAMIN B12; Future  - VITAMIN B6; Future  - escitalopram oxalate (LEXAPRO) 10 mg tablet; Take 1 Tab by mouth daily.  Dispense: 90 Tab; Refill: 3    2. Partial symptomatic epilepsy with complex partial seizures, intractable, without status epilepticus ([place])    - CBC WITH AUTOMATED DIFF; Future  - CMP; Future  - lamoTRIgine (LAMICTAL XR) 200 mg  tr24 ER tablet; Take 1 Tab by mouth daily. Indications: COMPLEX-PARTIAL EPILEPSY  Dispense: 90 Tab; Refill: 3  - lacosamide (VIMPAT) 150 mg tab tablet; Take 1 Tab by mouth two (2) times a day. Max Daily Amount: 300 mg.  Dispense: 180 Tab; Refill: 3  - levETIRAcetam (KEPPRA XR) 750 mg ER tablet; Take 2 Tabs by mouth two (2)  times a day. Indications: COMPLEX-PARTIAL EPILEPSY  Dispense: 360 Tab; Refill: 3    I have spent more than half of 25 minutes face to face time  in discussion about primary diagnosis, side-effects of treatments, plan of care, precautions.Marland Kitchen

## 2016-03-21 NOTE — Patient Instructions (Signed)
[employer]    As a valued patient, you will be receiving a survey from [employer]. We encourage you to share your thoughts and opinions about the care you received today. Thank you for choosing [facility].

## 2016-03-23 ENCOUNTER — Inpatient Hospital Stay: Admit: 2016-03-23 | Payer: BLUE CROSS/BLUE SHIELD | Primary: Family Medicine

## 2016-03-23 DIAGNOSIS — Z13220 Encounter for screening for lipoid disorders: Secondary | ICD-10-CM

## 2016-03-23 DIAGNOSIS — R569 Unspecified convulsions: Secondary | ICD-10-CM

## 2016-03-23 LAB — METABOLIC PANEL, COMPREHENSIVE
A-G Ratio: 1.3 (ref 1.2–2.2)
ALT (SGPT): 19 U/L (ref 12–78)
AST (SGOT): 12 U/L — ABNORMAL LOW (ref 15–37)
Albumin: 4.2 g/dL (ref 3.4–5.0)
Alk. phosphatase: 58 U/L (ref 45–117)
Anion gap: 7 mmol/L (ref 6–15)
BUN/Creatinine ratio: 23 (ref 7–25)
BUN: 19 MG/DL — ABNORMAL HIGH (ref 7–18)
Bilirubin, total: 0.5 MG/DL (ref <1.1)
CO2: 32 mmol/L (ref 21–32)
Calcium: 9 MG/DL (ref 8.5–10.1)
Chloride: 103 mmol/L (ref 98–107)
Creatinine: 0.84 MG/DL (ref 0.60–1.30)
GFR est AA: >60 mL/min/{1.73_m2} (ref >60)
GFR est non-AA: >60 mL/min/{1.73_m2} (ref >60)
Globulin: 3.3 g/dL (ref 2.4–3.5)
Glucose: 86 mg/dL (ref 70–110)
Potassium: 5 mmol/L (ref 3.5–5.3)
Protein, total: 7.5 g/dL (ref 6.4–8.2)
Sodium: 142 mmol/L (ref 136–145)

## 2016-03-23 LAB — CBC WITH AUTOMATED DIFF
ABS. BASOPHILS: 0 10*3/uL (ref 0.0–0.1)
ABS. EOSINOPHILS: 0.1 10*3/uL (ref 0.0–0.5)
ABS. LYMPHOCYTES: 1.6 10*3/uL (ref 0.8–3.5)
ABS. MONOCYTES: 0.3 10*3/uL — ABNORMAL LOW (ref 0.8–3.5)
ABS. NEUTROPHILS: 2.9 10*3/uL (ref 1.5–8.0)
BASOPHILS: 1 % (ref 0–2)
EOSINOPHILS: 1 % (ref 0–5)
HCT: 39.6 % — ABNORMAL LOW (ref 41–53)
HGB: 12.7 g/dL (ref 12.0–16.0)
LYMPHOCYTES: 32 % (ref 19–48)
MCH: 28.7 PG (ref 27–31)
MCHC: 32.1 g/dL (ref 31–37)
MCV: 89.4 FL (ref 80–100)
MONOCYTES: 7 % (ref 3–9)
MPV: 11.2 FL — ABNORMAL HIGH (ref 5.9–10.3)
NEUTROPHILS: 59 % (ref 40–74)
PLATELET: 197 10*3/uL (ref 130–400)
RBC: 4.43 M/uL (ref 4.2–5.4)
RDW: 13 % (ref 11.5–14.5)
WBC: 4.9 10*3/uL (ref 4.5–10.8)

## 2016-03-23 LAB — LIPID PANEL
Cholesterol, total: 168 MG/DL (ref <200)
HDL Cholesterol: 77 MG/DL (ref 32–96)
LDL, calculated: 80.28 MG/DL (ref <130)
Triglyceride: 67 MG/DL (ref <150)
VLDL, calculated: 13.4 MG/DL (ref 5–32)

## 2016-03-23 LAB — VITAMIN B12: Vitamin B12: 1406 pg/mL — ABNORMAL HIGH (ref 254–1320)

## 2016-03-23 LAB — MAGNESIUM: Magnesium: 2.2 mg/dL (ref 1.8–2.4)

## 2016-03-28 LAB — VITAMIN B6: Vitamin B6: 31 ug/L (ref 2.0–32.8)

## 2016-06-21 ENCOUNTER — Encounter: Attending: Neurology | Primary: Family Medicine

## 2016-07-18 ENCOUNTER — Ambulatory Visit
Admit: 2016-07-18 | Discharge: 2016-07-18 | Payer: PRIVATE HEALTH INSURANCE | Attending: Neurology | Primary: Family Medicine

## 2016-07-18 DIAGNOSIS — R569 Unspecified convulsions: Secondary | ICD-10-CM

## 2016-07-18 MED ORDER — LACOSAMIDE 150 MG TAB
150 mg | ORAL_TABLET | Freq: Two times a day (BID) | ORAL | 3 refills | Status: DC
Start: 2016-07-18 — End: 2016-09-26

## 2016-07-18 MED ORDER — LAMOTRIGINE ER 50 MG 24 HR TAB
50 mg | ORAL_TABLET | Freq: Two times a day (BID) | ORAL | 3 refills | Status: DC
Start: 2016-07-18 — End: 2017-05-21

## 2016-07-18 MED ORDER — LEVETIRACETAM 250 MG TAB
250 mg | ORAL_TABLET | Freq: Two times a day (BID) | ORAL | 3 refills | Status: DC
Start: 2016-07-18 — End: 2018-04-02

## 2016-07-18 NOTE — Progress Notes (Signed)
[name], MD   [address], [place] [address]  Phone:  [phone]  Fax:  [phone]      Patient ID  Name:  Natasha Herman  DOB:  1965-09-07  MRN:  [id number]  Age:  51 y.o.  PCP:  Hermenia Bers, DO    Subjective:     Encounter Date:  07/18/2016    Referring Physician: No ref. provider found    No chief complaint on file.      History of Present Illness:     "51 year old female with left temporal lobectomy in 2014  Having partial seizures without altered awareness since then   2/ day seizures frequency did not increase since then  Since she cut down on lexapro she is having one seizure every day and occasionally 3/ day  She had an episodes when she was with for few seconds, with sensory changes on top of the head, right arm numbness  Epigastric sensation. She did not have confusion or altered awareness after the spell    According to her this had electrical correlate on EEG."    "Pt having 6-7 days of seizures freedom followed by cluster of 3 seizures  Pt feels tired after the episode. Feels the aura, no LOC  Did not tolerated the increase in dose of lamictal   Pt husband said video EEG while on medications did not capture present episodes pt having  But according to pt they were captured and these were seizures"      "Pt having dejavue and brief spell without LOC for few seconds some times multiple a day and they are increasing in frequency"    Increasing lamictal worsened seizures, much more intense.  She has also worsening cognition.  She is going back on the medication      Current Outpatient Prescriptions on File Prior to Visit   Medication Sig Dispense Refill   ??? escitalopram oxalate (LEXAPRO) 10 mg tablet Take 1 Tab by mouth daily. 90 Tab 3   ??? lamoTRIgine (LAMICTAL XR) 200 mg tr24 ER tablet Take 1 Tab by mouth daily. Indications: COMPLEX-PARTIAL EPILEPSY 90 Tab 3   ??? lacosamide (VIMPAT) 150 mg tab tablet Take 1 Tab by mouth two (2) times  a day. Max Daily Amount: 300 mg. 180 Tab 3   ??? levETIRAcetam (KEPPRA XR) 750 mg ER tablet Take 2 Tabs by mouth two (2) times a day. Indications: COMPLEX-PARTIAL EPILEPSY 360 Tab 3   ??? ascorbic acid, vitamin C, (VITAMIN C) 500 mg tablet Take 500 mg by mouth daily.     ??? cyanocobalamin 1,000 mcg tablet Take 1,000 mcg by mouth daily.     ??? b complex vitamins (B COMPLEX 1) tablet Take 1 Tab by mouth daily.       No current facility-administered medications on file prior to visit.       Allergies   Allergen Reactions   ??? Aptiom [Eslicarbazepine] Seizures   ??? Dilantin [Phenytoin Sodium Extended] Rash   ??? Fycompa [Perampanel] Other (comments)     [place] OUT     Patient Active Problem List   Diagnosis Code   ??? Epilepsy without status epilepticus, not intractable ([place]) G40.909   ??? Depression F32.9     Past Medical History:   Diagnosis Date   ??? Depression    ??? Seizures ([place])     DAILY MORE THAN ONE A DAY      Past  Surgical History:   Procedure Laterality Date   ??? BREAST SURGERY PROCEDURE UNLISTED Left     lumpectomy-2003 BENIGN   ??? COLONOSCOPY N/A 01/14/2016    COLONOSCOPY performed by [name], MD at [facility]   ??? HX CAROTID ENDARTERECTOMY Left    ??? HX CRANIOTOMY      2014 -- L temperol lobe    ??? HX CYST REMOVAL      R. Bartolin Cyst-2001   ??? HX HEENT      wisdom teeth -1995      Family History   Problem Relation Age of Onset   ??? Cancer Mother      Breast   ??? Diabetes Mother    ??? Hypertension Mother    ??? Parkinsonism Father    ??? Diabetes Sister    ??? Hypertension Sister    ??? Diabetes Maternal Grandmother       Social History     Social History   ??? Marital status: MARRIED     Spouse name: N/A   ??? Number of children: 1   ??? 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 Smoking status: Never Smoker   ??? Smokeless tobacco: Never Used   ??? Alcohol use No   ??? Drug use: No   ??? Sexual activity: No     Other Topics Concern   ??? None      Social History Narrative    Patient does drive       Review of Systems:  Review of Systems   Constitutional: Negative for fever.   Eyes: Positive for blurred vision.   Respiratory: Negative for cough.    Cardiovascular: Negative for chest pain.   Gastrointestinal: Negative for heartburn.   Genitourinary: Negative for dysuria.   Musculoskeletal: Negative for myalgias.   Skin: Negative for rash.   Neurological: Positive for seizures. Negative for headaches.   Endo/Heme/Allergies: Does not bruise/bleed easily.   Psychiatric/Behavioral: Negative for depression.         Objective:     Vitals:    07/18/16 1215   BP: 122/72   Pulse: 79   Weight: 62.6 kg (138 lb)   Height: 6\' 1"  (1.854 m)       Physical Exam:  Neurologic Exam     Mental Status   Oriented to person, place, and time.     Cranial Nerves   Cranial nerves II through XII intact.     Motor Exam   Muscle bulk: normal  Overall muscle tone: normal    Sensory Exam   Light touch normal.           Impression:     51 year old female with   Left temporal lobectomy  Very few sec spells or sensory changes  Without altered awareness happening since 2 years with increasing frequency      Pt having periods of spell remission followed by cluster of spells   This could be aura vs complex partial seizures      Plan:   1) pt will cut down on the lamictal to 100mg  for 2 weeks , then change to lamictal 50mg  po bid for 2 weeks    2) pt will then add keppra 250mg  po bid to keppra 1500mg  po bid to make it 1750mg  po bid.    3) pt told about option of  briviact  I have spent more than half of 25 minutes face to face time  in discussion about primary diagnosis, side-effects of treatments, plan of care, precautions.Marland Kitchen

## 2016-07-18 NOTE — Patient Instructions (Signed)
[facility]  Office working hours are Monday - Thursday 9:00 am to 5:00 pm.    Office phone number is [phone] and fax is [phone].  For non-emergent medical care and clinical advice during office hours:   1. Call office or   2.  Send message or request using MyChart  For non-emergent medical care and clinical advice after office hours:  1.  Call [phone] or  2.  Send message or request using MyChart  Emergency care can be obtained at the [facility] ER, Urgent Care or calling 911.    Patient Satisfaction Survey  We appreciate you giving us your e-mail address.  Please watch for our patient satisfaction survey which you will receive by e-mail.  We strive to provide you with the best care possible.  We respect all comments and will take comments into consideration to improve our service.  Thank you for your participation.    As a valued patient, you will be receiving a survey from [employer].  We encourage you to share your thoughts and opinions about the care you received today.  Thank you for choosing [facility].

## 2016-08-07 LAB — HM PAP SMEAR: Pap Smear, External: NEGATIVE

## 2016-09-18 ENCOUNTER — Encounter

## 2016-09-18 MED ORDER — LEVETIRACETAM ER 750 MG 24 HR TAB
750 mg | ORAL_TABLET | Freq: Two times a day (BID) | ORAL | 1 refills | Status: DC
Start: 2016-09-18 — End: 2017-03-10

## 2016-09-18 NOTE — Progress Notes (Signed)
Left refill of vimpat on VM on pharmacy line at [facility] in [place] per pharmacy tech, one month supply with five refills.

## 2016-09-18 NOTE — Telephone Encounter (Signed)
From: Rondell Reams  To: [name], MD  Sent: 09/18/2016 11:59 AM EDT  Subject:  Medication Renewal Request    Original  authorizing provider: [name], MD    Elberta  L. Leiner would like a refill of the following medications:  levETIRAcetam  (KEPPRA XR) 750 mg ER tablet [name], MD]    Preferred  pharmacy: [facility]    Comment:

## 2016-09-25 ENCOUNTER — Encounter

## 2016-09-25 NOTE — Telephone Encounter (Signed)
Wants 90 day suppy

## 2016-09-26 ENCOUNTER — Encounter

## 2016-09-26 MED ORDER — VIMPAT 150 MG TABLET
150 mg | ORAL_TABLET | ORAL | 1 refills | Status: DC
Start: 2016-09-26 — End: 2017-03-15

## 2016-09-26 NOTE — Telephone Encounter (Signed)
Cancelled other rx for vimpat to #180 and 1 refill since medication is controlled.

## 2016-09-27 NOTE — Telephone Encounter (Signed)
Says the script was sent for 30 days, now needs a 90 day script that she has never been able to feel.  Patient said Marcie Bal took care of this yesterday

## 2016-10-19 ENCOUNTER — Ambulatory Visit
Admit: 2016-10-19 | Discharge: 2016-10-19 | Payer: PRIVATE HEALTH INSURANCE | Attending: Family Medicine | Primary: Family Medicine

## 2016-10-19 ENCOUNTER — Encounter: Attending: Family Medicine | Primary: Family Medicine

## 2016-10-19 DIAGNOSIS — L659 Nonscarring hair loss, unspecified: Secondary | ICD-10-CM

## 2016-10-19 DIAGNOSIS — Z0000 Encounter for general adult medical examination without abnormal findings: Secondary | ICD-10-CM

## 2016-10-19 NOTE — Progress Notes (Signed)
Please send letter asking her to contact office   Hermenia Bers, DO

## 2016-10-19 NOTE — Patient Instructions (Signed)
[facility]  Office working hours are Monday - Friday 8:00AM-5:00PM.  Office phone number is [phone] and fax is [phone]  For non-emergent medical care and clinical advice during office hours:   1. Call office or   2.  Send message or request using MyChart  For non-emergent medical care and clinical advice after office hours:  1.  Send message or request using MyChart   2.  Call [phone] and leave a message with our answering service or    Emergency care can be obtained at the [facility] ER, Urgent Care or by calling 911.    Patient Satisfaction Survey  As a valued patient, you will be receiving a survey from [employer].  We encourage you to share your thoughts and opinions about the care you received today. Thank you for choosing [facility]           BRING ALL MEDICATION BOTTLES TO EVERY APPOINTMENT    Medication Refills -   * Please do not let your medication run out before calling for a refill, the best practice would be to call when you have 2 weeks of medication left.  * Please allow 48 hours for medications to be called into your pharmacy when you request a medication refill      Have MyChart? Use it to request medication refills, it's quick and easy! If you have questions about MyChart please feel free to ask our staff!

## 2016-10-19 NOTE — Progress Notes (Signed)
[place], D.O.  [address] [place] [address]  Phone:  [phone]  Fax:  [phone]    Name:  Natasha Herman.O.B:  1965-09-21    Encounter Date:  10/19/2016    Patient  has a past medical history of Depression and Seizures ([place]).    Chief Complaint   Patient presents with   ??? Annual Wellness Visit        HPI  Here today for Yearly wellness:     Pt has epilepsy - still has seizures active seizures several days a week- Following with Koneru (Neurology)   Worried that her hair is thinning- just feel is diffusely shedding   Denies skin or nail changes, no hx of constipation. Denies fatigue unless postictal     Lab Results   Component Value Date/Time    TSH 1.82 04/14/2014 11:05 AM       Preventative Health  Mammogram [facility] 07/12/2016  PAP- [facility] Cooter 08/07/2016   Colonoscopy 01/14/2016 - Dr. Ronnald Ramp       Review of Systems  A thorough 10 point review of systems was done and was unremarkable except for HPI and chronic medical conditions.   Past Medical History:   Diagnosis Date   ??? Depression    ??? Seizures ([place])     DAILY MORE THAN ONE A DAY     Past Surgical History:   Procedure Laterality Date   ??? BREAST SURGERY PROCEDURE UNLISTED Left     lumpectomy-2003 BENIGN   ??? COLONOSCOPY N/A 01/14/2016    COLONOSCOPY performed by [name], MD at [facility]   ??? HX CAROTID ENDARTERECTOMY Left    ??? HX CRANIOTOMY      2014 -- L temperol lobe    ??? HX CYST REMOVAL      R. Bartolin Cyst-2001   ??? HX HEENT      wisdom teeth -1995     Family History   Problem Relation Age of Onset   ??? Cancer Mother      Breast   ??? Diabetes Mother    ??? Hypertension Mother    ??? Parkinsonism Father    ??? Diabetes Sister    ??? Hypertension Sister    ??? Diabetes Maternal Grandmother      Social History     Social History   ??? Marital status: MARRIED     Spouse name: N/A   ??? Number of children: 1   ??? 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 Smoking status: Never Smoker   ??? Smokeless tobacco: Never Used   ??? Alcohol use No   ??? Drug use: No   ??? Sexual activity: No     Other Topics Concern   ??? Not on file     Social History Narrative    Patient does drive     Allergies   Allergen Reactions   ??? Aptiom [Eslicarbazepine] Seizures   ??? Dilantin [Phenytoin Sodium Extended] Rash   ??? Fycompa [Perampanel] Other (comments)     PATIENT STATES SHE PASSED OUT     Current Outpatient Prescriptions on File Prior to Visit   Medication Sig Dispense Refill   ??? VIMPAT 150 mg tab tablet TAKE ONE TABLET BY MOUTH TWICE DAILY 180 Tab 1   ??? levETIRAcetam (KEPPRA XR) 750 mg ER tablet Take 2  Tabs by mouth two (2) times a day. Indications: COMPLEX-PARTIAL EPILEPSY 360 Tab 1   ??? levETIRAcetam (KEPPRA) 250 mg tablet Take 1 Tab by mouth two (2) times a day. 180 Tab 3   ??? lamoTRIgine (LAMICTAL XR) 50 mg tr24 ER tablet Take 1 Tab by mouth two (2) times a day. (Patient taking differently: Take 100 mg by mouth two (2) times a day.) 180 Tab 3     No current facility-administered medications on file prior to visit.        Vitals:    10/19/16 1408   BP: 120/72   Pulse: 70   Temp: 98.7 ??F (37.1 ??C)   TempSrc: Oral   SpO2: 98%   Weight: 138 lb (62.6 kg)   Height: 6\' 1"  (1.854 m)      Body mass index is 18.21 kg/(m^2).    Physical Exam   Constitutional: She is oriented to person, place, and time. She appears well-developed and well-nourished. No distress.   HENT:   Head: Normocephalic and atraumatic.   Right Ear: External ear normal.   Left Ear: External ear normal.   Nose: Nose normal.   Mouth/Throat: Oropharynx is clear and moist. No oropharyngeal exudate.   Eyes: Conjunctivae are normal. Pupils are equal, round, and reactive to light. No scleral icterus.   Neck: Normal range of motion. Neck supple. No JVD present. No tracheal deviation present.   Cardiovascular: Normal rate, regular rhythm, normal heart sounds and intact distal pulses.  Exam reveals no gallop and no friction rub.     No murmur heard.  Pulmonary/Chest: Effort normal and breath sounds normal. No respiratory distress.   Abdominal: Soft. Bowel sounds are normal. She exhibits no distension and no mass. There is no tenderness.   Musculoskeletal: Normal range of motion. She exhibits no edema.   Lymphadenopathy:     She has no cervical adenopathy.   Neurological: She is alert and oriented to person, place, and time.   Skin: Skin is warm and dry.   Psychiatric: She has a normal mood and affect. Her behavior is normal.   Nursing note and vitals reviewed.      Assessment/Plan:    ICD-10-CM ICD-9-CM    1. Routine general medical examination at a health care facility Z00.00 V70.0    2. Nonintractable epilepsy without status epilepticus, unspecified epilepsy type ([place]) G40.909 345.90    3. Depression, unspecified depression type F32.9 311    4. Alopecia L65.9 704.00 CBC WITH AUTOMATED DIFF      IRON PROFILE      FERRITIN      VITAMIN D, 25 HYDROXY      THYROID PANEL W/TSH   Continue with Koneru     Follow-up Disposition:  Return in about 1 year (around 10/19/2017).         I have discussed the above plan with the patient. Patient has also been given instructions and information on her conditions.  Patient is aware of all possible side effects and agrees with the above mentioned plan.       Hermenia Bers, DO

## 2016-10-19 NOTE — Progress Notes (Signed)
Letter sent

## 2016-10-19 NOTE — Progress Notes (Signed)
No answer/no voicemail set up.

## 2016-10-19 NOTE — Progress Notes (Signed)
Vitamin D insuffiencey - not quit deficient   Is she taking any OTC Vitamin D ?  Hermenia Bers, DO

## 2016-10-20 ENCOUNTER — Inpatient Hospital Stay: Admit: 2016-10-20 | Payer: BLUE CROSS/BLUE SHIELD | Primary: Family Medicine

## 2016-10-20 LAB — CBC WITH AUTOMATED DIFF
ABS. BASOPHILS: 0 10*3/uL (ref 0.0–0.1)
ABS. EOSINOPHILS: 0.1 10*3/uL (ref 0.0–0.5)
ABS. IMM. GRANS.: 0 10*3/uL
ABS. LYMPHOCYTES: 1.6 10*3/uL (ref 0.8–3.5)
ABS. MONOCYTES: 0.5 10*3/uL — ABNORMAL LOW (ref 0.8–3.5)
ABS. NEUTROPHILS: 3.1 10*3/uL (ref 1.5–8.0)
BASOPHILS: 1 % (ref 0–2)
EOSINOPHILS: 1 % (ref 0–5)
HCT: 37.8 % — ABNORMAL LOW (ref 41–53)
HGB: 12.1 g/dL (ref 12.0–16.0)
IMMATURE GRANULOCYTES: 0 % — ABNORMAL LOW (ref 2–10)
LYMPHOCYTES: 30 % (ref 19–48)
MCH: 28.9 PG (ref 27–31)
MCHC: 32 g/dL (ref 31–37)
MCV: 90.4 FL (ref 80–100)
MONOCYTES: 9 % (ref 3–9)
MPV: 11.3 FL — ABNORMAL HIGH (ref 5.9–10.3)
NEUTROPHILS: 59 % (ref 40–74)
PLATELET: 227 10*3/uL (ref 130–400)
RBC: 4.18 M/uL — ABNORMAL LOW (ref 4.2–5.4)
RDW: 11.8 % (ref 11.5–14.5)
WBC: 5.3 10*3/uL (ref 4.5–10.8)

## 2016-10-20 LAB — IRON PROFILE
Iron % saturation: 23 %
Iron: 65 ug/dL (ref 50–175)
TIBC: 286 ug/dL (ref 250–450)

## 2016-10-20 LAB — THYROID PANEL W/TSH
Free thyroxine index: 3.1 (ref 1.40–5.2)
T3 Uptake: 32 % (ref 31–39)
T4, Total: 9.8 ug/dL (ref 4.7–13.3)
TSH: 2.08 u[IU]/mL (ref 0.35–3.74)

## 2016-10-20 LAB — FERRITIN: Ferritin: 48 NG/ML (ref 26–388)

## 2016-10-20 LAB — VITAMIN D, 25 HYDROXY: Vitamin D 25-Hydroxy: 16.9 ng/mL — ABNORMAL LOW (ref 30–80)

## 2017-01-04 ENCOUNTER — Encounter

## 2017-01-04 ENCOUNTER — Inpatient Hospital Stay: Admit: 2017-01-04 | Payer: BLUE CROSS/BLUE SHIELD | Primary: Family Medicine

## 2017-01-04 DIAGNOSIS — N3090 Cystitis, unspecified without hematuria: Secondary | ICD-10-CM

## 2017-01-04 LAB — AMB POC URINALYSIS DIP STICK AUTO W/O MICRO
Bilirubin (UA POC): NEGATIVE
Glucose (UA POC): NEGATIVE
Leukocyte esterase (UA POC): NEGATIVE
Nitrites (UA POC): NEGATIVE
Protein (UA POC): NEGATIVE
Specific gravity (UA POC): 1.02 (ref 1.001–1.035)
Urobilinogen (UA POC): 1 (ref 0.2–1)
pH (UA POC): 6.5 (ref 4.6–8.0)

## 2017-01-04 NOTE — Telephone Encounter (Signed)
I will place an order for an UA and culture , she can drop off a specimen here .

## 2017-01-04 NOTE — Telephone Encounter (Signed)
Patient c/o burning with urination, pain,pressure and frequent urination x 2 days. She is requesting order for UA   Please call patient at [phone]

## 2017-01-06 LAB — CULTURE, URINE
Culture result:: 30000
Culture: 30000

## 2017-01-09 MED ORDER — FLUCONAZOLE 150 MG TAB
150 mg | ORAL_TABLET | Freq: Every day | ORAL | 1 refills | Status: AC
Start: 2017-01-09 — End: 2017-01-10

## 2017-01-09 NOTE — Telephone Encounter (Signed)
Call in Diflucan 150 mg 1 tab with 1 refill . Take 1 tab tonight . Let us know if it gets worse , it might be atrophic vaginitis

## 2017-01-09 NOTE — Telephone Encounter (Signed)
Culture showed contamination , repeat a clean catch for culture if she is symptomatic.

## 2017-01-09 NOTE — Telephone Encounter (Signed)
This message is old and I did not realize, she left urine on thursday she is wanting results

## 2017-01-09 NOTE — Telephone Encounter (Signed)
L/m for return call

## 2017-01-09 NOTE — Telephone Encounter (Signed)
Pt states she is having symptoms of yeast infection at this time, she is burning on the outside and itching, states that her pants make it uncomfortable when they rub against her,she said that she is tender in that area also, has history of yeast infections when she was younger... do you want her to come in for appt  For cultures or call in diflucan?

## 2017-01-09 NOTE — Telephone Encounter (Signed)
As below, please call in

## 2017-01-09 NOTE — Telephone Encounter (Signed)
Thank you

## 2017-01-09 NOTE — Telephone Encounter (Signed)
Script called in

## 2017-01-16 ENCOUNTER — Encounter: Attending: Neurology | Primary: Family Medicine

## 2017-02-27 ENCOUNTER — Ambulatory Visit
Admit: 2017-02-27 | Discharge: 2017-02-27 | Payer: PRIVATE HEALTH INSURANCE | Attending: Neurology | Primary: Family Medicine

## 2017-02-27 DIAGNOSIS — G40109 Localization-related (focal) (partial) symptomatic epilepsy and epileptic syndromes with simple partial seizures, not intractable, without status epilepticus: Secondary | ICD-10-CM

## 2017-02-27 NOTE — Progress Notes (Signed)
[place], MD   [address] [place] [address]  Phone:  [phone]  Fax:  [phone]      Name:  Natasha Herman  D.O.B.:  [birth year]  Encounter Date:  02/27/2017    Chief Complaint   Patient presents with   ??? Follow-up     This patient is here today for a six month follow up. Patient states " I am having seizures a lot and i had eleven seizures one day and last week " Patient states i have had 559 seizures this year alone. Husband states that patient has been crying and depressed  , which is usually not her behavior.   ??? Seizure     Patient states she saw Dr Karlton Lemon for years.     This is a 51 year old lady who was seen today in follow up visit for seizures. She was accompanied by her husband. She last saw Dr Glee Arvin in February 2018.    She had a left temporal lobectomy in 2014 at [facility] in [place], [place]. She has a history of partial seizure without altered level of awareness. Her seizures have been increasing in frequency.   She has a long, complicated history of seizures, has been in a study, was on multiple antiepileptic medications. She has had multiple imaging studies, EEGS etc???    In 2013, she had 309 seizures; to date this year she has had 560 seizures. The seizures are not ???bad seizures???; she feels like she is singing a song and the song keeps playing. 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 She was followed by Dr Karlton Lemon. 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 Following her temporal lobectomy, she had more seizures, starting 2 hours postoperatively. She had seizures described as decreased level of awareness and manifesting as hallucinating, lasting between 15-45 seconds, associated with redness of the eyes and her face ???drawing down???, extreme fatigue. She was tried on Lexapro which increased the seizures. She tried  increasing her Keppra by 125 mg po bid on top of 1500 mg po bid, but that heightened her anxiety. She is currently on 2x750 mg po bid.  She has had one seizure so far today. The seizures usually occur as she tries to communicate. She tried relaxation techniques but that makes her more anxious. Vagal nerve stimulation was discussed with her in the past.   She wonders if her seizures frequency has to do with her not having a menstrual cycle since 2011. She was told she has not yet reached menopause based on her laboratory studies.  Walking helps her seizures.  She also asked about my take on CBD oil.  On review of systems, she reports tinnitus, insomnia.    Past Medical History:   Diagnosis Date   ??? Depression    ??? Epilepsy ([place])    ??? Seizures ([place])     DAILY MORE THAN ONE A DAY     Past Surgical History:   Procedure Laterality Date   ??? BREAST SURGERY PROCEDURE UNLISTED Left     lumpectomy-2003 BENIGN   ??? COLONOSCOPY N/A 01/14/2016    COLONOSCOPY performed by [name], MD at [facility]   ??? HX CAROTID ENDARTERECTOMY Left    ??? HX CRANIOTOMY      2014 -- L temperol lobe    ??? HX CYST REMOVAL  R. Bartolin Cyst-2001   ??? HX HEENT      wisdom teeth -1995     Social History     Socioeconomic History   ??? Marital status: MARRIED     Spouse name: Not on file   ??? Number of children: 1   ??? Years of education: Not on file   ??? Highest education level: Not on file   Social Needs   ??? Financial resource strain: Not on file   ??? Food insecurity - worry: Not on file   ??? Food insecurity - inability: Not on file   ??? Transportation needs - medical: Not on file   ??? Transportation needs - non-medical: Not on file   Occupational History   ??? Occupation: Payroll   Tobacco Use   ??? Smoking status: Never Smoker   ??? Smokeless tobacco: Never Used   Substance and Sexual Activity   ??? Alcohol use: No     Alcohol/week: 0.0 oz   ??? Drug use: No   ??? Sexual activity: No   Other Topics Concern   ??? Not on file   Social History Narrative     Patient does drive     Family History   Problem Relation Age of Onset   ??? Cancer Mother         Breast   ??? Diabetes Mother    ??? Hypertension Mother    ??? Parkinsonism Father    ??? Diabetes Sister    ??? Hypertension Sister    ??? Diabetes Maternal Grandmother      Allergies   Allergen Reactions   ??? Aptiom [Eslicarbazepine] Seizures   ??? Dilantin [Phenytoin Sodium Extended] Rash   ??? Fycompa [Perampanel] Other (comments)     PATIENT STATES SHE PASSED OUT     Current Outpatient Medications   Medication Sig Dispense Refill   ??? ergocalciferol (VITAMIN D2) 50,000 unit capsule Take 50,000 Units by mouth daily.     ??? calcium-cholecalciferol, d3, (CALCIUM 600 + D) 600-125 mg-unit tab Take 500 mg by mouth daily.     ??? levETIRAcetam (KEPPRA) 250 mg tablet Take 1 Tab by mouth two (2) times a day. 180 Tab 3   ??? lamoTRIgine (LAMICTAL XR) 50 mg tr24 ER tablet Take 1 Tab by mouth two (2) times a day. 180 Tab 1   ??? levETIRAcetam (KEPPRA XR) 750 mg ER tablet TAKE 2 TABLETS TWICE A DAY FOR COMPLEX-PARTIAL        EPILEPSY 360 Tab 1   ??? lacosamide (VIMPAT) 150 mg tab tablet TAKE ONE TABLET BY MOUTH TWICE DAILY 180 Tab 1     General exam:  Visit Vitals  Ht 6\' 1"  (1.854 m)   Wt 62.3 kg (137 lb 6.4 oz)   BMI 18.13 kg/m??     The above was personally reviewed.     Physical Exam    Neurologic exam:  Mental status: awake, alert, well cooperative with the exam.  Cranial nerves: intact extraocular movements. Normal facial symmetry. No dysarthria. No tongue weakness.  Motor: intact in the arms and legs. No arm pronator drift.  Finger to nose: intact with no dysmetria.  Gait: intact with no ataxia. Romberg (-).  General appearance: in no acute distress. No carotid bruits. No heart murmurs. Lungs clear to auscultation.      Assessment/Plan:    ICD-10-CM ICD-9-CM    1. 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 Status post lobectomy of brain Z90.89 V45.89  I recommended decreasing the Keppra by 500 mg. She currently takes 2 of the 750 mg po bid.     Would continue Vimpat 150 mg pi bid, Lamictal takes 100 mg po bid. Biofeedback does not work for her.    Vagal nerve stimulator is a consideration but that could be complicated by the history of left carotid artery dissection.    As to the increased seizures frequency in the perimenopausal state, increased estrogen level can cause increased seizures whereas progesterone decreases the frequency of seizures. It is unclear from the literature what the correlation is between her seizures and between perimenopause.    I did not recommend Xanax to help with insomnia and anxiety.     I do not know enough about CBD oil to know if it helps her seizures or not.    She gets Vimpat through [employer]. She gets the other medications via the mail.    Follow up in 6 months.      Electronically Signed by:    Bettye Boeck, MD

## 2017-02-27 NOTE — Patient Instructions (Signed)
[facility]  Office working hours are Monday - Thursday 9:00 am to 5:00 pm.    Office phone number is [phone] and fax is [phone].  For non-emergent medical care and clinical advice during office hours:   1. Call office or   2.  Send message or request using MyChart  For non-emergent medical care and clinical advice after office hours:  1.  Call [phone] or  2.  Send message or request using MyChart  Emergency care can be obtained at the [facility] ER, Urgent Care or calling 911.    Patient Satisfaction Survey  We appreciate you giving us your e-mail address.  Please watch for our patient satisfaction survey which you will receive by e-mail.  We strive to provide you with the best care possible.  We respect all comments and will take comments into consideration to improve our service.  Thank you for your participation.    As a valued patient, you will be receiving a survey from [employer].  We encourage you to share your thoughts and opinions about the care you received today.  Thank you for choosing [facility].

## 2017-03-10 ENCOUNTER — Encounter

## 2017-03-15 ENCOUNTER — Encounter

## 2017-03-15 MED ORDER — LACOSAMIDE 150 MG TAB
150 mg | ORAL_TABLET | ORAL | 1 refills | Status: DC
Start: 2017-03-15 — End: 2017-03-15

## 2017-03-15 MED ORDER — LEVETIRACETAM ER 750 MG 24 HR TAB
750 mg | ORAL_TABLET | ORAL | 1 refills | Status: DC
Start: 2017-03-15 — End: 2017-11-01

## 2017-03-15 MED ORDER — LACOSAMIDE 150 MG TAB
150 mg | ORAL_TABLET | ORAL | 1 refills | Status: DC
Start: 2017-03-15 — End: 2017-06-19

## 2017-03-23 ENCOUNTER — Emergency Department (HOSPITAL_COMMUNITY)
Admission: EM | Admit: 2017-03-23 | Discharge: 2017-03-23 | Disposition: A | Discharge status: Home / Self Care | Payer: 59 | Attending: Emergency Medicine | Admitting: Emergency Medicine

## 2017-03-23 ENCOUNTER — Emergency Department (HOSPITAL_COMMUNITY): Payer: 59

## 2017-03-23 ENCOUNTER — Encounter (HOSPITAL_COMMUNITY): Payer: Self-pay | Admitting: Emergency Medicine

## 2017-03-23 DIAGNOSIS — Z79899 Other long term (current) drug therapy: Secondary | ICD-10-CM | POA: Insufficient documentation

## 2017-03-23 DIAGNOSIS — I10 Essential (primary) hypertension: Secondary | ICD-10-CM | POA: Diagnosis not present

## 2017-03-23 DIAGNOSIS — F17210 Nicotine dependence, cigarettes, uncomplicated: Secondary | ICD-10-CM | POA: Insufficient documentation

## 2017-03-23 DIAGNOSIS — R1032 Left lower quadrant pain: Secondary | ICD-10-CM

## 2017-03-23 DIAGNOSIS — N9489 Other specified conditions associated with female genital organs and menstrual cycle: Secondary | ICD-10-CM | POA: Diagnosis not present

## 2017-03-23 HISTORY — DX: Essential (primary) hypertension: I10

## 2017-03-23 LAB — COMPREHENSIVE METABOLIC PANEL
ALK PHOS: 75 U/L (ref 38–126)
ALT: 13 U/L — AB (ref 14–54)
AST: 14 U/L — AB (ref 15–41)
Albumin: 3.9 g/dL (ref 3.5–5.0)
Anion gap: 9 (ref 5–15)
BUN: 14 mg/dL (ref 6–20)
CALCIUM: 10.3 mg/dL (ref 8.9–10.3)
CHLORIDE: 104 mmol/L (ref 101–111)
CO2: 25 mmol/L (ref 22–32)
CREATININE: 0.83 mg/dL (ref 0.44–1.00)
GFR calc Af Amer: >60 mL/min (ref >60)
Glucose, Bld: 122 mg/dL — ABNORMAL HIGH (ref 65–99)
Potassium: 3.4 mmol/L — ABNORMAL LOW (ref 3.5–5.1)
SODIUM: 138 mmol/L (ref 135–145)
Total Bilirubin: 0.4 mg/dL (ref 0.3–1.2)
Total Protein: 7.4 g/dL (ref 6.5–8.1)

## 2017-03-23 LAB — URINALYSIS, ROUTINE W REFLEX MICROSCOPIC
Bilirubin Urine: NEGATIVE
Glucose, UA: NEGATIVE mg/dL
Ketones, ur: NEGATIVE mg/dL
Leukocytes, UA: NEGATIVE
Nitrite: NEGATIVE
PH: 6 (ref 5.0–8.0)
Protein, ur: NEGATIVE mg/dL
SPECIFIC GRAVITY, URINE: 1.019 (ref 1.005–1.030)

## 2017-03-23 LAB — CBC
HCT: 39.7 % (ref 36.0–46.0)
Hemoglobin: 12.9 g/dL (ref 12.0–15.0)
MCH: 27.5 pg (ref 26.0–34.0)
MCHC: 32.5 g/dL (ref 30.0–36.0)
MCV: 84.6 fL (ref 78.0–100.0)
PLATELETS: 358 10*3/uL (ref 150–400)
RBC: 4.69 MIL/uL (ref 3.87–5.11)
RDW: 15 % (ref 11.5–15.5)
WBC: 8.8 10*3/uL (ref 4.0–10.5)

## 2017-03-23 LAB — LIPASE, BLOOD: LIPASE: 27 U/L (ref 11–51)

## 2017-03-23 MED ORDER — IOPAMIDOL (ISOVUE-300) INJECTION 61%
INTRAVENOUS | Status: AC
  Administered 2017-03-23: 100 mL
  Filled 2017-03-23: qty 100

## 2017-03-23 MED ORDER — DICYCLOMINE HCL 20 MG PO TABS: 20.0000 mg | ORAL_TABLET | Freq: Two times a day (BID) | ORAL | 0 refills | Status: DC

## 2017-03-23 MED ORDER — DICYCLOMINE HCL 10 MG/ML IM SOLN
20.0000 mg | Freq: Once | INTRAMUSCULAR | Status: AC
  Administered 2017-03-23: 20 mg via INTRAMUSCULAR
  Filled 2017-03-23: qty 2

## 2017-03-23 MED ORDER — OXYBUTYNIN CHLORIDE 5 MG PO TABS: 5.0000 mg | ORAL_TABLET | Freq: Three times a day (TID) | ORAL | 0 refills | Status: AC

## 2017-03-23 MED ORDER — SODIUM CHLORIDE 0.9 % IV BOLUS (SEPSIS)
1000.0000 mL | Freq: Once | INTRAVENOUS | Status: AC
  Administered 2017-03-23: 1000 mL via INTRAVENOUS

## 2017-03-23 NOTE — Discharge Instructions (Signed)
Take Tylenol 1000 mg 4 times a day for 1 week. This is the maximum dose of Tylenol (acetaminophen) you can take from all sources. Please check other over-the-counter medications and prescriptions to ensure you are not taking other medications that contain acetaminophen.  You may also take ibuprofen 400 mg 6 times a day alternating with or at the same time as tylenol.  °

## 2017-03-23 NOTE — ED Provider Notes (Signed)
[facility] EMERGENCY DEPARTMENT Provider Note   CSN: [id number] Arrival date & time: 03/23/17  0556     History   Chief Complaint Chief Complaint  Patient presents with  . Abdominal Pain    HPI Judith Pollard is a 51 y.o. female.  HPI  Had LLQ pain for 3 weeks, was coming and going but for the last week it has been constant and more severe. Pressure, dull pain. 7/10.  Worse when urinating, bearing down.    No dysuria, no vaginal discharge or bleeding No constipation.  Stool softer than normal, no blood in stool No fevers, appetite ok Urinary frequency  No hx of abdominal surgeries, does have hx of stomach bleeding with procedure 20 yrs ago Past Medical History:  Diagnosis Date  . Hypertension     There are no active problems to display for this patient.   History reviewed. No pertinent surgical history.  OB History    No data available       Home Medications    Prior to Admission medications   Medication Sig Start Date End Date Taking? Authorizing Provider  albuterol (PROVENTIL HFA;VENTOLIN HFA) 108 (90 BASE) MCG/ACT inhaler Inhale 2 puffs into the lungs every 6 (six) hours as needed for wheezing or shortness of breath. 06/01/13  Yes Rodolph Bong, MD  naproxen sodium (ANAPROX) 220 MG tablet Take 220 mg by mouth 2 (two) times daily as needed (pain).   Yes [provider]  dicyclomine (BENTYL) 20 MG tablet Take 1 tablet (20 mg total) by mouth 2 (two) times daily. 03/23/17   Alvira Monday, MD  guaiFENesin-codeine 100-10 MG/5ML syrup Take 5 mLs by mouth at bedtime as needed for cough. Patient not taking: Reported on 03/23/2017 06/01/13   Rodolph Bong, MD  ipratropium (ATROVENT) 0.06 % nasal spray Place 2 sprays into both nostrils 4 (four) times daily. Patient not taking: Reported on 03/23/2017 06/01/13   Rodolph Bong, MD  oxybutynin (DITROPAN) 5 MG tablet Take 1 tablet (5 mg total) by mouth 3 (three) times daily. 03/23/17 03/28/17   Alvira Monday, MD  predniSONE (DELTASONE) 50 MG tablet Take 1 tablet (50 mg total) by mouth daily. Patient not taking: Reported on 03/23/2017 06/01/13   Rodolph Bong, MD    Family History History reviewed. No pertinent family history.  Social History Social History  Substance Use Topics  . Smoking status: Current Every Day Smoker  . Smokeless tobacco: Never Used  . Alcohol use Yes     Comment: social     Allergies   Patient has no known allergies.   Review of Systems Review of Systems  Constitutional: Negative for fever.  HENT: Negative for sore throat.   Eyes: Negative for visual disturbance.  Respiratory: Negative for cough and shortness of breath.   Cardiovascular: Negative for chest pain.  Gastrointestinal: Positive for abdominal pain. Negative for constipation, diarrhea, nausea and vomiting.  Genitourinary: Positive for pelvic pain. Negative for difficulty urinating, vaginal bleeding and vaginal discharge.  Musculoskeletal: Negative for back pain and neck pain.  Skin: Negative for rash.  Neurological: Negative for syncope and headaches.     Physical Exam Updated Vital Signs BP (!) 151/95   Pulse 66   Temp 97.6 F (36.4 C) (Oral)   Resp 17   Ht 5\' 4"  (1.626 m)   Wt 91.6 kg (202 lb)   SpO2 98%   BMI 34.67 kg/m   Physical Exam  Constitutional: She is oriented to  person, place, and time. She appears well-developed and well-nourished. No distress.  HENT:  Head: Normocephalic and atraumatic.  Eyes: Conjunctivae and EOM are normal.  Neck: Normal range of motion.  Cardiovascular: Normal rate, regular rhythm, normal heart sounds and intact distal pulses.  Exam reveals no gallop and no friction rub.   No murmur heard. Pulmonary/Chest: Effort normal and breath sounds normal. No respiratory distress. She has no wheezes. She has no rales.  Abdominal: Soft. She exhibits no distension. There is tenderness (LLQ). There is no guarding.  Musculoskeletal: She  exhibits no edema or tenderness.  Neurological: She is alert and oriented to person, place, and time.  Skin: Skin is warm and dry. No rash noted. She is not diaphoretic. No erythema.  Nursing note and vitals reviewed.    ED Treatments / Results  Labs (all labs ordered are listed, but only abnormal results are displayed) Labs Reviewed  COMPREHENSIVE METABOLIC PANEL - Abnormal; Notable for the following:       Result Value   Potassium 3.4 (*)    Glucose, Bld 122 (*)    AST 14 (*)    ALT 13 (*)    All other components within normal limits  URINALYSIS, ROUTINE W REFLEX MICROSCOPIC - Abnormal; Notable for the following:    Hgb urine dipstick SMALL (*)    Bacteria, UA RARE (*)    Squamous Epithelial / LPF 0-5 (*)    All other components within normal limits  WET PREP, GENITAL  LIPASE, BLOOD  CBC  GC/CHLAMYDIA PROBE AMP ([place]) NOT AT [facility]    EKG  EKG Interpretation None       Radiology Ct Abdomen Pelvis W Contrast  Result Date: 03/23/2017 CLINICAL DATA:  Left lower abdominal pain EXAM: CT ABDOMEN AND PELVIS WITH CONTRAST TECHNIQUE: Multidetector CT imaging of the abdomen and pelvis was performed using the standard protocol following bolus administration of intravenous contrast. CONTRAST:  ISOVUE-300 IOPAMIDOL (ISOVUE-300) INJECTION 61% COMPARISON:  None. FINDINGS: Lower chest: Lung bases are clear. No effusions. Heart is normal size. Hepatobiliary: No focal hepatic abnormality. Gallbladder unremarkable. Pancreas: No focal abnormality or ductal dilatation. Spleen: No focal abnormality.  Normal size. Adrenals/Urinary Tract: No adrenal abnormality. No focal renal abnormality. No stones or hydronephrosis. Urinary bladder is unremarkable. Stomach/Bowel: Normal appendix. Stomach, large and small bowel grossly unremarkable. Vascular/Lymphatic: No evidence of aneurysm or adenopathy. Reproductive: Uterus and adnexa unremarkable. No mass. Prominent vessels in the left pelvis could  be seen with gonadal vein reflux and pelvic congestion syndrome. Other: No free fluid or free air. Musculoskeletal: No acute bony abnormality. IMPRESSION: No acute findings in the abdomen or pelvis. Prominent vessels in the left adnexa which can be seen with gonadal vein reflux and pelvic congestion syndrome. Electronically Signed   By: Charlett Nose M.D.   On: 03/23/2017 11:13    Procedures Procedures (including critical care time)  Medications Ordered in ED Medications  sodium chloride 0.9 % bolus 1,000 mL (0 mLs Intravenous Stopped 03/23/17 1241)  dicyclomine (BENTYL) injection 20 mg (20 mg Intramuscular Given 03/23/17 1034)  iopamidol (ISOVUE-300) 61 % injection (100 mLs  Contrast Given 03/23/17 1042)     Initial Impression / Assessment and Plan / ED Course  I have reviewed the triage vital signs and the nursing notes.  Pertinent labs & imaging results that were available during my care of the patient were reviewed by me and considered in my medical decision making (see chart for details).     51 year old  female with a history of hypertension who presents with concern for left lower quadrant pain.  Labs show no sign of urinary tract infection or pancreatitis.  Hx of constant dull pain not consistent with torsion. No vaginal discharge and doubt pelvic infection.  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  Discussed with patient that is not usually seen in menopausal patient's, however recommend close follow up with OB/GYN for further evaluation for pelvic pain, outpatient ultrasound.  Given prescription for Bentyl, recommend Tylenol and ibuprofen. (take ibuprofen with food) Patient discharged in stable condition with understanding of reasons to return.   Final Clinical Impressions(s) / ED Diagnoses   Final diagnoses:  Abdominal pain, left lower quadrant  Pelvic congestion syndrome, concern for on CT     New Prescriptions Discharge Medication List as of 03/23/2017 12:10 PM    START taking these medications   Details  dicyclomine (BENTYL) 20 MG tablet Take 1 tablet (20 mg total) by mouth 2 (two) times daily., Starting Fri 03/23/2017, Print         Alvira MondaySchlossman, Wyndell Cardiff, MD 03/23/17 [id number]

## 2017-03-23 NOTE — ED Triage Notes (Signed)
Pt presents to the ER with LLQ abd pain that patient suspects is her L ovary; pt denies hx of ovarian isues; denies n/v/d but irregular BMs; pt also noted to be hypertensive in triage and states shes been off her BP medication for a while and needs a new MD

## 2017-04-02 ENCOUNTER — Encounter: Attending: Physician Assistant | Primary: Family Medicine

## 2017-04-03 ENCOUNTER — Encounter

## 2017-04-06 ENCOUNTER — Inpatient Hospital Stay: Admit: 2017-04-06 | Payer: BLUE CROSS/BLUE SHIELD | Attending: Chiropractor | Primary: Family Medicine

## 2017-04-06 DIAGNOSIS — M5117 Intervertebral disc disorders with radiculopathy, lumbosacral region: Secondary | ICD-10-CM

## 2017-05-17 ENCOUNTER — Encounter

## 2017-05-21 ENCOUNTER — Encounter

## 2017-05-22 MED ORDER — LAMOTRIGINE ER 50 MG 24 HR TAB
50 mg | ORAL_TABLET | Freq: Two times a day (BID) | ORAL | 1 refills | Status: DC
Start: 2017-05-22 — End: 2017-11-01

## 2017-06-19 ENCOUNTER — Encounter

## 2017-06-19 MED ORDER — LACOSAMIDE 150 MG TAB
150 mg | ORAL_TABLET | ORAL | 1 refills | Status: DC
Start: 2017-06-19 — End: 2017-06-20

## 2017-06-19 NOTE — Telephone Encounter (Signed)
Patient called requesting refills on her Vimpat 150 Mg Tabs. Take 1 tab by mouth 2 times daily. Disp # 180 with 1 refill. Patient has a follow up scheduled with Dr. Miguel Aschoff in March. Printed off prescription and had Dr. Miguel Aschoff sign it  and faxed to [facility] [phone]. Put fax confirmation into scan folder to be scanned into patient's chart. Per Maudie Mercury

## 2017-06-20 MED ORDER — LACOSAMIDE 150 MG TAB
150 mg | ORAL_TABLET | ORAL | 1 refills | Status: DC
Start: 2017-06-20 — End: 2017-11-01

## 2017-06-20 NOTE — Telephone Encounter (Signed)
[facility] did not receive this fax.  [facility]

## 2017-06-20 NOTE — Telephone Encounter (Signed)
Patients husband called and stated that [facility] is not the pharmacy that they  prefer [facility] in [place]

## 2017-07-30 LAB — HM MAMMOGRAPHY

## 2017-08-28 ENCOUNTER — Encounter: Attending: Neurology | Primary: Family Medicine

## 2017-10-23 ENCOUNTER — Ambulatory Visit: Attending: Family Medicine | Primary: Family Medicine

## 2017-10-23 ENCOUNTER — Encounter: Attending: Family Medicine | Primary: Family Medicine

## 2017-10-23 ENCOUNTER — Inpatient Hospital Stay: Admit: 2017-10-23 | Payer: BLUE CROSS/BLUE SHIELD | Primary: Family Medicine

## 2017-10-23 ENCOUNTER — Ambulatory Visit
Admit: 2017-10-23 | Discharge: 2017-10-23 | Payer: PRIVATE HEALTH INSURANCE | Attending: Family Medicine | Primary: Family Medicine

## 2017-10-23 DIAGNOSIS — G40909 Epilepsy, unspecified, not intractable, without status epilepticus: Secondary | ICD-10-CM

## 2017-10-23 DIAGNOSIS — Z0000 Encounter for general adult medical examination without abnormal findings: Secondary | ICD-10-CM

## 2017-10-23 NOTE — Progress Notes (Signed)
Please let patient know that I have reviewed her labs, which are stable- no recommended changes in current treatment plan.

## 2017-10-23 NOTE — Progress Notes (Signed)
Informed patient voiced understanding

## 2017-10-23 NOTE — Progress Notes (Signed)
[facility]  Dr. Claris Gower MD  [address] [place] [address]  [phone]      Name:  Natasha Herman.O.B:  1966-05-02  Age: 52 y.o.  PCP: Claris Gower, MD      Encounter Date:  10/23/2017    CHIEF COMPLAINT:  Annual Wellness Visit     HISTORY OF PRESENT ILLNESS  Natasha Herman is a 52 y.o. female comes to the office for annual wellness exam.  She has a past medical history significant for epileptic seizures???currently being followed by neurology, reports recent medication adjustment with some improvement in frequency of seizures???she does not drive.  She also has a history of vitamin D deficiency, currently taking daily vitamin D and calcium supplement.  No recent fracture.  Followed by GYN for annual Pap smears, most recent was March 2019 NIL, denies history of abnormal Pap smears.  Mammogram was performed February 2019, BI-RADS Category 2???she has no history of breast cancer.  She had a colonoscopy August 2017, no polyps recommended repeat in 10 years.  She has no family history personal history of colon cancer.  She does not smoke cigarettes, drink alcohol or use illicit drugs.  She has a previous history of reactive depression, screening for depression today is negative.  She reports healthy well-balanced diet as well as daily exercise.  She denies any current fever, chills, chest pain, shortness of breath, cough, abdominal pain, nausea, vomiting.  She is up-to-date on all vaccinations except for shingles, she declines shingles vaccine due to fear of reaction???patient parent counseled on risks versus benefits.      Current Medications  Current Outpatient Medications   Medication Sig Dispense Refill   ??? medroxyPROGESTERone (PROVERA) 5 mg tablet Take 5 mg by mouth daily.     ??? lacosamide (VIMPAT) 150 mg tab tablet TAKE ONE TABLET BY MOUTH TWICE DAILY 180 Tab 1   ??? lamoTRIgine (LAMICTAL XR) 50 mg tr24 ER tablet Take 1 Tab by mouth two (2) times a day.  180 Tab 1   ??? levETIRAcetam (KEPPRA XR) 750 mg ER tablet TAKE 2 TABLETS TWICE A DAY FOR COMPLEX-PARTIAL        EPILEPSY 360 Tab 1   ??? ergocalciferol (VITAMIN D2) 50,000 unit capsule Take 50,000 Units by mouth daily.     ??? calcium-cholecalciferol, d3, (CALCIUM 600 + D) 600-125 mg-unit tab Take 500 mg by mouth daily.     ??? levETIRAcetam (KEPPRA) 250 mg tablet Take 1 Tab by mouth two (2) times a day. 180 Tab 3       Allergies  Allergies   Allergen Reactions   ??? Aptiom [Eslicarbazepine] Seizures   ??? Dilantin [Phenytoin Sodium Extended] Rash   ??? Fycompa [Perampanel] Other (comments)     PATIENT STATES SHE PASSED OUT       Past Medical History  Past Medical History:   Diagnosis Date   ??? Depression    ??? Epilepsy ([place])    ??? Seizures ([place])     DAILY MORE THAN ONE A DAY       Past Surgical History  Past Surgical History:   Procedure Laterality Date   ??? BREAST SURGERY PROCEDURE UNLISTED Left     lumpectomy-2003 BENIGN   ??? COLONOSCOPY N/A 01/14/2016    COLONOSCOPY performed by [name], MD at [facility]   ??? HX CAROTID ENDARTERECTOMY Left    ??? HX CRANIOTOMY      2014 -- L  temperol lobe    ??? HX CYST REMOVAL      R. Bartolin Cyst-2001   ??? HX HEENT      wisdom teeth -1995       Social History  Social History     Socioeconomic History   ??? Marital status: MARRIED     Spouse name: Not on file   ??? Number of children: 1   ??? Years of education: Not on file   ??? Highest education level: Not on file   Occupational History   ??? Occupation: Payroll   Tobacco Use   ??? Smoking status: Never Smoker   ??? Smokeless tobacco: Never Used   Substance and Sexual Activity   ??? Alcohol use: No     Alcohol/week: 0.0 oz   ??? Drug use: No   ??? Sexual activity: Never   Social History Narrative    Patient does drive         Family History  Family History   Problem Relation Age of Onset   ??? Cancer Mother         Breast   ??? Diabetes Mother    ??? Hypertension Mother    ??? Parkinsonism Father    ??? Diabetes Sister    ??? Hypertension Sister    ??? Diabetes Maternal  Grandmother              Review of Systems  10 system ROS completed and found to be negative except for those mentioned in HPI       Visit Vitals  BP 119/70 (BP 1 Location: Left arm, BP Patient Position: Sitting)   Pulse 60   Temp 98.6 ??F (37 ??C) (Oral)   Ht 6\' 1"  (1.854 m)   Wt 141 lb 3.2 oz (64 kg)   SpO2 99%   BMI 18.63 kg/m??       Physical Exam   Constitutional: She is oriented to person, place, and time. She appears well-developed and well-nourished.   HENT:   Head: Normocephalic and atraumatic.   Eyes: Pupils are equal, round, and reactive to light. EOM are normal.   Neck: Normal range of motion. Neck supple. No thyromegaly present.   Cardiovascular: Normal rate. Exam reveals no gallop and no friction rub.   No murmur heard.  Pulmonary/Chest: Effort normal and breath sounds normal. She has no wheezes.   Abdominal: Soft. Bowel sounds are normal. She exhibits no distension. There is no tenderness.   Musculoskeletal: Normal range of motion.   Lymphadenopathy:     She has no cervical adenopathy.   Neurological: She is alert and oriented to person, place, and time. No cranial nerve deficit. Coordination normal.   Skin: Skin is warm and dry. No rash noted.   Psychiatric: She has a normal mood and affect. Her behavior is normal. Judgment and thought content normal.   Vitals reviewed.      Assessment/Plan:    Diagnoses and all orders for this visit:    1. Wellness examination  ???Health maintenance, anticipatory guidance and recommendations reviewed with patient today as documented in HPI.  2. Nonintractable epilepsy without status epilepticus, unspecified epilepsy type ([place])  -     TSH 3RD GENERATION; Future  -     CBC WITH AUTOMATED DIFF; Future  -     METABOLIC PANEL, COMPREHENSIVE; Future  ??? Continue current medications, follow-up with neurology as scheduled  ???ER/RTC precautions reviewed  3. Vitamin D deficiency  -     VITAMIN D, 25  HYDROXY; Future  Continue vitamin D and calcium supplement.       Follow-up and  Dispositions    ?? Return in about 1 year (around 10/24/2018).

## 2017-10-23 NOTE — Progress Notes (Signed)
[facility]  Dr. Claris Gower MD  [address] [place] [address]  [phone]      Name:  Natasha Herman.O.B:  [birth year]  Age: 52 y.o.  PCP: Claris Gower, MD      Encounter Date:  10/23/2017    CHIEF COMPLAINT:  Annual Wellness Visit     HISTORY OF PRESENT ILLNESS  Natasha Herman is a 52 y.o. female comes to the office for annual wellness exam.  She has a past medical history significant for epileptic seizures???currently being followed by neurology, reports recent medication adjustment with some improvement in frequency of seizures???she does not drive.  She also has a history of vitamin D deficiency, currently taking daily vitamin D and calcium supplement.  No recent fracture.  Followed by GYN for annual Pap smears, most recent was March 2019 NIL, denies history of abnormal Pap smears.  Mammogram was performed February 2019, BI-RADS Category 2???she has no history of breast cancer.  She had a colonoscopy August 2017, no polyps recommended repeat in 10 years.  She has no family history personal history of colon cancer.  She does not smoke cigarettes, drink alcohol or use illicit drugs.  She has a previous history of reactive depression, screening for depression today is negative.  She reports healthy well-balanced diet as well as daily exercise.  She denies any current fever, chills, chest pain, shortness of breath, cough, abdominal pain, nausea, vomiting.  She is up-to-date on all vaccinations except for shingles, she declines shingles vaccine due to fear of reaction???patient parent counseled on risks versus benefits.      Current Medications  Current Outpatient Medications   Medication Sig Dispense Refill   ??? medroxyPROGESTERone (PROVERA) 5 mg tablet Take 5 mg by mouth daily.     ??? lacosamide (VIMPAT) 150 mg tab tablet TAKE ONE TABLET BY MOUTH TWICE DAILY 180 Tab 1   ??? lamoTRIgine (LAMICTAL XR) 50 mg tr24 ER tablet Take 1 Tab by mouth two  (2) times a day. 180 Tab 1   ??? levETIRAcetam (KEPPRA XR) 750 mg ER tablet TAKE 2 TABLETS TWICE A DAY FOR COMPLEX-PARTIAL        EPILEPSY 360 Tab 1   ??? ergocalciferol (VITAMIN D2) 50,000 unit capsule Take 50,000 Units by mouth daily.     ??? calcium-cholecalciferol, d3, (CALCIUM 600 + D) 600-125 mg-unit tab Take 500 mg by mouth daily.     ??? levETIRAcetam (KEPPRA) 250 mg tablet Take 1 Tab by mouth two (2) times a day. 180 Tab 3       Allergies  Allergies   Allergen Reactions   ??? Aptiom [Eslicarbazepine] Seizures   ??? Dilantin [Phenytoin Sodium Extended] Rash   ??? Fycompa [Perampanel] Other (comments)     PATIENT STATES SHE PASSED OUT       Past Medical History  Past Medical History:   Diagnosis Date   ??? Depression    ??? Epilepsy ([place])    ??? Seizures ([place])     DAILY MORE THAN ONE A DAY       Past Surgical History  Past Surgical History:   Procedure Laterality Date   ??? BREAST SURGERY PROCEDURE UNLISTED Left     lumpectomy-2003 BENIGN   ??? COLONOSCOPY N/A 01/14/2016    COLONOSCOPY performed by [name], MD at [facility]   ??? HX CAROTID ENDARTERECTOMY Left    ??? HX CRANIOTOMY      2014 -- L  temperol lobe    ??? HX CYST REMOVAL      R. Bartolin Cyst-2001   ??? HX HEENT      wisdom teeth -1995       Social History  Social History     Socioeconomic History   ??? Marital status: MARRIED     Spouse name: Not on file   ??? Number of children: 1   ??? Years of education: Not on file   ??? Highest education level: Not on file   Occupational History   ??? Occupation: Payroll   Tobacco Use   ??? Smoking status: Never Smoker   ??? Smokeless tobacco: Never Used   Substance and Sexual Activity   ??? Alcohol use: No     Alcohol/week: 0.0 oz   ??? Drug use: No   ??? Sexual activity: Never   Social History Narrative    Patient does drive         Family History  Family History   Problem Relation Age of Onset   ??? Cancer Mother         Breast   ??? Diabetes Mother    ??? Hypertension Mother    ??? Parkinsonism Father    ??? Diabetes Sister    ??? Hypertension Sister     ??? Diabetes Maternal Grandmother              Review of Systems  10 system ROS completed and found to be negative except for those mentioned in HPI       Visit Vitals  BP 119/70 (BP 1 Location: Left arm, BP Patient Position: Sitting)   Pulse 60   Temp 98.6 ??F (37 ??C) (Oral)   Ht 6\' 1"  (1.854 m)   Wt 141 lb 3.2 oz (64 kg)   SpO2 99%   BMI 18.63 kg/m??       Physical Exam   Constitutional: She is oriented to person, place, and time. She appears well-developed and well-nourished.   HENT:   Head: Normocephalic and atraumatic.   Eyes: Pupils are equal, round, and reactive to light. EOM are normal.   Neck: Normal range of motion. Neck supple. No thyromegaly present.   Cardiovascular: Normal rate. Exam reveals no gallop and no friction rub.   No murmur heard.  Pulmonary/Chest: Effort normal and breath sounds normal. She has no wheezes.   Abdominal: Soft. Bowel sounds are normal. She exhibits no distension. There is no tenderness.   Musculoskeletal: Normal range of motion.   Lymphadenopathy:     She has no cervical adenopathy.   Neurological: She is alert and oriented to person, place, and time. No cranial nerve deficit. Coordination normal.   Skin: Skin is warm and dry. No rash noted.   Psychiatric: She has a normal mood and affect. Her behavior is normal. Judgment and thought content normal.   Vitals reviewed.      Assessment/Plan:    Diagnoses and all orders for this visit:    1. Wellness examination  ???Health maintenance, anticipatory guidance and recommendations reviewed with patient today as documented in HPI.  2. Nonintractable epilepsy without status epilepticus, unspecified epilepsy type ([place])  -     TSH 3RD GENERATION; Future  -     CBC WITH AUTOMATED DIFF; Future  -     METABOLIC PANEL, COMPREHENSIVE; Future  ??? Continue current medications, follow-up with neurology as scheduled  ???ER/RTC precautions reviewed  3. Vitamin D deficiency  -     VITAMIN D, 25  HYDROXY; Future  Continue vitamin D and calcium supplement.        Follow-up and Dispositions    ?? Return in about 1 year (around 10/24/2018).

## 2017-10-23 NOTE — Patient Instructions (Signed)
[facility]    Office working hours are Monday - Friday 8:00AM-6:00PM.  First and Third Monday are 8:00AM-7:30PM.  Office phone number is [phone] and fax is [phone].    For non-emergent medical care and clinical advice during office hours:   1. Call office or   2.?? Send message or request using MyChart  For non-emergent medical care and clinical advice after office hours:  1.?? Send message or request using MyChart   2.?? Call [phone] and leave a message with our answering service or  ??  Emergency care can be obtained at the [facility] ER, Urgent Care or by calling 911.  ??  Patient Satisfaction Survey  As a valued patient, you will be receiving a survey from [employer].  We encourage you to share your thoughts and opinions about the care you received today. Thank you for choosing [facility]  ????  All medications ordered during your visit were transmitted electronically to the pharmacy unless you were given a printed prescription. We will notify you of results of laboratory or pathology testing done today as soon as they are available.  Please get any lab work or radiology testing ordered at this appointment as directed or prior to your next appointment. These can be done at [facility] or the [facility] in the [facility].   Centralized scheduling will contact your regarding any testing ordered at this appointment to schedule and/or the Centralized Referral Center will contact you regarding any referrals which have been made. If you have any questions regarding these appointments, call the office at [phone].   ??  BRING ALL MEDICATION BOTTLES TO EVERY APPOINTMENT  ??  Medication Refills -   * Please do not let your medication run out before calling for a refill, the best practice would be to call when you have 2 weeks of medication left.  * Please allow 48 hours for medications to be called into your pharmacy  when you request a medication refill    ??  Have MyChart? Use it to request medication refills, it's quick and easy! If you have questions about MyChart please feel free to ask our staff!

## 2017-10-24 LAB — METABOLIC PANEL, COMPREHENSIVE
A-G Ratio: 1.6 (ref 1.2–2.2)
ALT (SGPT): 18 U/L (ref 12–78)
AST (SGOT): 12 U/L — ABNORMAL LOW (ref 15–37)
Albumin: 4.5 g/dL (ref 3.4–5.0)
Alk. phosphatase: 58 U/L (ref 45–117)
Anion gap: 8 mmol/L (ref 6–15)
BUN/Creatinine ratio: 25 (ref 7–25)
BUN: 21 MG/DL — ABNORMAL HIGH (ref 7–18)
Bilirubin, total: 0.5 MG/DL (ref <1.1)
CO2: 29 mmol/L (ref 21–32)
Calcium: 9.7 MG/DL (ref 8.5–10.1)
Chloride: 107 mmol/L (ref 98–107)
Creatinine: 0.84 MG/DL (ref 0.60–1.30)
GFR est AA: >60 mL/min/{1.73_m2} (ref >60)
GFR est non-AA: >60 mL/min/{1.73_m2} (ref >60)
Globulin: 2.8 g/dL (ref 2.4–3.5)
Glucose: 87 mg/dL (ref 70–110)
Potassium: 3.8 mmol/L (ref 3.5–5.3)
Protein, total: 7.3 g/dL (ref 6.4–8.2)
Sodium: 144 mmol/L (ref 136–145)

## 2017-10-24 LAB — CBC WITH AUTOMATED DIFF
ABS. BASOPHILS: 0.1 10*3/uL (ref 0.0–0.1)
ABS. EOSINOPHILS: 0 10*3/uL (ref 0.0–0.5)
ABS. IMM. GRANS.: 0 10*3/uL
ABS. LYMPHOCYTES: 1.6 10*3/uL (ref 0.8–3.5)
ABS. MONOCYTES: 0.5 10*3/uL — ABNORMAL LOW (ref 0.8–3.5)
ABS. NEUTROPHILS: 3 10*3/uL (ref 1.5–8.0)
BASOPHILS: 1 % (ref 0–2)
EOSINOPHILS: 1 % (ref 0–5)
HCT: 39.1 % — ABNORMAL LOW (ref 41–53)
HGB: 12.6 g/dL (ref 12.0–16.0)
IMMATURE GRANULOCYTES: 0 % — ABNORMAL LOW (ref 2–10)
LYMPHOCYTES: 31 % (ref 19–48)
MCH: 29.4 PG (ref 27–31)
MCHC: 32.2 g/dL (ref 31–37)
MCV: 91.4 FL (ref 80–100)
MONOCYTES: 9 % (ref 3–9)
MPV: 11.5 FL — ABNORMAL HIGH (ref 5.9–10.3)
NEUTROPHILS: 58 % (ref 40–74)
PLATELET: 197 10*3/uL (ref 130–400)
RBC: 4.28 M/uL (ref 4.2–5.4)
RDW: 12.3 % (ref 11.5–14.5)
WBC: 5.1 10*3/uL (ref 4.5–10.8)

## 2017-10-24 LAB — TSH 3RD GENERATION
TSH: 1.5 u[IU]/mL (ref 0.35–3.74)
TSH: 1.5 u[IU]/mL (ref 0.35–3.74)

## 2017-10-24 LAB — VITAMIN D, 25 HYDROXY: Vitamin D 25-Hydroxy: 36.9 ng/mL (ref 30–80)

## 2017-10-24 LAB — CBC WITH AUTO DIFFERENTIAL
Basophils %: 1 % (ref 0–2)
Basophils Absolute: 0.1 10*3/uL (ref 0.0–0.1)
Eosinophils %: 1 % (ref 0–5)
Eosinophils Absolute: 0 10*3/uL (ref 0.0–0.5)
Granulocyte Absolute Count: 0 10*3/uL
Hematocrit: 39.1 % — ABNORMAL LOW (ref 41–53)
Hemoglobin: 12.6 g/dL (ref 12.0–16.0)
Immature Granulocytes: 0 % — ABNORMAL LOW (ref 2–10)
Lymphocytes %: 31 % (ref 19–48)
Lymphocytes Absolute: 1.6 10*3/uL (ref 0.8–3.5)
MCH: 29.4 PG (ref 27–31)
MCHC: 32.2 g/dL (ref 31–37)
MCV: 91.4 FL (ref 80–100)
MPV: 11.5 FL — ABNORMAL HIGH (ref 5.9–10.3)
Monocytes %: 9 % (ref 3–9)
Monocytes Absolute: 0.5 10*3/uL — ABNORMAL LOW (ref 0.8–3.5)
Neutrophils %: 58 % (ref 40–74)
Neutrophils Absolute: 3 10*3/uL (ref 1.5–8.0)
Platelets: 197 10*3/uL (ref 130–400)
RBC: 4.28 M/uL (ref 4.2–5.4)
RDW: 12.3 % (ref 11.5–14.5)
WBC: 5.1 10*3/uL (ref 4.5–10.8)

## 2017-10-24 LAB — COMPREHENSIVE METABOLIC PANEL
ALT: 18 U/L (ref 12–78)
AST: 12 U/L — ABNORMAL LOW (ref 15–37)
Albumin/Globulin Ratio: 1.6 (ref 1.2–2.2)
Albumin: 4.5 g/dL (ref 3.4–5.0)
Alkaline Phosphatase: 58 U/L (ref 45–117)
Anion Gap: 8 mmol/L (ref 6–15)
BUN: 21 MG/DL — ABNORMAL HIGH (ref 7–18)
Bun/Cre Ratio: 25 (ref 7–25)
CO2: 29 mmol/L (ref 21–32)
Calcium: 9.7 MG/DL (ref 8.5–10.1)
Chloride: 107 mmol/L (ref 98–107)
Creatinine: 0.84 MG/DL (ref 0.60–1.30)
EGFR IF NonAfrican American: >60 mL/min/{1.73_m2} (ref >60)
GFR African American: >60 mL/min/{1.73_m2} (ref >60)
Globulin: 2.8 g/dL (ref 2.4–3.5)
Glucose: 87 mg/dL (ref 70–110)
Potassium: 3.8 mmol/L (ref 3.5–5.3)
Sodium: 144 mmol/L (ref 136–145)
Total Bilirubin: 0.5 MG/DL (ref <1.1)
Total Protein: 7.3 g/dL (ref 6.4–8.2)

## 2017-10-24 LAB — VITAMIN D 25 HYDROXY: Vit D, 25-Hydroxy: 36.9 ng/mL (ref 30–80)

## 2017-10-24 NOTE — Progress Notes (Signed)
Please let patient know that I have reviewed her labs, which are stable??? no recommended changes in current treatment plan.

## 2017-10-24 NOTE — Progress Notes (Signed)
Informed patient voiced understanding

## 2017-11-01 ENCOUNTER — Encounter

## 2017-11-01 ENCOUNTER — Encounter: Attending: Neurology | Primary: Family Medicine

## 2017-11-01 MED ORDER — LEVETIRACETAM ER 750 MG 24 HR TAB
750 mg | ORAL_TABLET | ORAL | 3 refills | Status: DC
Start: 2017-11-01 — End: 2018-03-19

## 2017-11-01 MED ORDER — LACOSAMIDE 150 MG TAB
150 mg | ORAL_TABLET | ORAL | 3 refills | Status: DC
Start: 2017-11-01 — End: 2018-03-19

## 2017-11-01 MED ORDER — LAMOTRIGINE ER 50 MG 24 HR TAB
50 mg | ORAL_TABLET | Freq: Two times a day (BID) | ORAL | 3 refills | Status: DC
Start: 2017-11-01 — End: 2018-03-19

## 2017-11-01 NOTE — Telephone Encounter (Signed)
Patient wants to know if she can get a 90 day supply and can it be faxed to [facility].

## 2017-11-01 NOTE — Telephone Encounter (Signed)
Patient called requesting refills on her Keppra XR 750 Mg Tabs. Take 2 tabs by mouth 2 times daily. Disp # 360 with 3 refills and her Lamictal XR 50 Mg Tabs. Take 1 tab by mouth 2 times daily. Disp # 180 with 3 refills  And her Vimpat 150 Mg Tabs. Take 1 tab by mouth 2 times daily. Disp # 180 with 3 refills. Patient has a follow up scheduled with Dr. Corbin Ade in June. I sent prescriptions to [facility] as requested.

## 2017-11-05 ENCOUNTER — Encounter: Attending: Neurology | Primary: Family Medicine

## 2017-11-06 NOTE — Telephone Encounter (Signed)
[facility] called and stated they had Keppra XR 750 Mg Tabs on back order and the patient is needing a refill on her medication so I ask if we could do Keppra XR 500 Mg Tabs. Take 3 tabs by mouth 2 times daily that way she is getting the same amount of Keppra since we are changing the MG and she voiced her understanding. So I gave her a verbal for Keppra XR 500 Mg Tabs. Take 3 tabs by mouth 2 times daily with 3 refills and she voiced her understanding.

## 2018-03-19 ENCOUNTER — Ambulatory Visit: Attending: Neurology | Primary: Family Medicine

## 2018-03-19 ENCOUNTER — Ambulatory Visit
Admit: 2018-03-19 | Discharge: 2018-03-19 | Payer: PRIVATE HEALTH INSURANCE | Attending: Neurology | Primary: Family Medicine

## 2018-03-19 DIAGNOSIS — G40219 Localization-related (focal) (partial) symptomatic epilepsy and epileptic syndromes with complex partial seizures, intractable, without status epilepticus: Secondary | ICD-10-CM

## 2018-03-19 MED ORDER — LACOSAMIDE 150 MG TAB
150 mg | ORAL_TABLET | ORAL | 3 refills | Status: AC
Start: 2018-03-19 — End: ?

## 2018-03-19 MED ORDER — DIVALPROEX 500 MG 24 HR TAB
500 mg | ORAL_TABLET | Freq: Two times a day (BID) | ORAL | 2 refills | Status: DC
Start: 2018-03-19 — End: 2018-03-19

## 2018-03-19 MED ORDER — DIVALPROEX 500 MG 24 HR TAB
500 mg | ORAL_TABLET | Freq: Two times a day (BID) | ORAL | 2 refills | Status: DC
Start: 2018-03-19 — End: 2018-04-02

## 2018-03-19 MED ORDER — LEVETIRACETAM 500 MG TAB
500 mg | ORAL_TABLET | Freq: Two times a day (BID) | ORAL | 3 refills | Status: DC
Start: 2018-03-19 — End: 2018-04-02

## 2018-03-19 MED ORDER — LEVETIRACETAM 500 MG TAB
500 mg | ORAL_TABLET | Freq: Two times a day (BID) | ORAL | 3 refills | Status: DC
Start: 2018-03-19 — End: 2018-03-19

## 2018-03-19 MED ORDER — LACOSAMIDE 150 MG TAB
150 mg | ORAL_TABLET | ORAL | 3 refills | Status: DC
Start: 2018-03-19 — End: 2018-03-19

## 2018-03-19 NOTE — Addendum Note (Signed)
Addendum  Note by Twanna Hy at 03/19/18 1500                Author: Twanna Hy  Service: --  Author Type: --       Filed: 03/20/18 1237  Encounter Date: 03/19/2018  Status: Signed          Editor: Twanna Hy          Addended by: Twanna Hy on: 03/20/2018 12:37 PM    Modules accepted: Orders

## 2018-03-19 NOTE — Addendum Note (Signed)
Addendum  Note by Johna Sheriff at 03/19/18 1500                Author: Johna Sheriff  Service: --  Author Type: --       Filed: 03/20/18 1234  Encounter Date: 03/19/2018  Status: Signed          Editor: Johna Sheriff          Addended by: Johna Sheriff on: 03/20/2018 12:34 PM    Modules accepted: Orders

## 2018-03-19 NOTE — Progress Notes (Signed)
[facility], MD  [address], [place] [address]  Phone:  [phone]  Fax:  [phone]      Patient ID  Name:  Natasha Herman  DOB:  02/05/1966  MRN:  [id number]  Age:  52 y.o.  Sex:  female   PCP:  Claris Gower, MD    Subjective:     Encounter Date:  03/19/2018    Chief Complaint   Patient presents with   ??? Seizure   ??? Medication Refill       History of Present Illness:   F/u case of Complex partial Sz   - has had Left Temporal Lobectomy    Pt has increased frequency of Sz      Past Medical History:   Diagnosis Date   ??? Depression    ??? Epilepsy ([place])    ??? Seizures ([place])     DAILY MORE THAN ONE A DAY     Past Surgical History:   Procedure Laterality Date   ??? BREAST SURGERY PROCEDURE UNLISTED Left     lumpectomy-2003 BENIGN   ??? COLONOSCOPY N/A 01/14/2016    COLONOSCOPY performed by [name], MD at [facility]   ??? HX CAROTID ENDARTERECTOMY Left    ??? HX CRANIOTOMY      2014 -- L temperol lobe    ??? HX CYST REMOVAL      R. Bartolin Cyst-2001   ??? HX HEENT      wisdom teeth -1995     Family History   Problem Relation Age of Onset   ??? Cancer Mother         Breast   ??? Diabetes Mother    ??? Hypertension Mother    ??? Parkinsonism Father    ??? Diabetes Sister    ??? Hypertension Sister    ??? Diabetes Maternal Grandmother      Social History     Socioeconomic History   ??? Marital status: MARRIED     Spouse name: Not on file   ??? Number of children: 1   ??? Years of education: Not on file   ??? Highest education level: Not on file   Occupational History   ??? Occupation: Payroll   Tobacco Use   ??? Smoking status: Never Smoker   ??? Smokeless tobacco: Never Used   Substance and Sexual Activity   ??? Alcohol use: No     Alcohol/week: 0.0 standard drinks   ??? Drug use: No   ??? Sexual activity: Never   Social History Narrative    Patient does drive       Current Outpatient Medications   Medication Sig Dispense Refill   ??? Calcium Carbonate-Vit D3-Min 600 mg calcium- 400 unit  tab Take  by mouth.     ??? divalproex ER (DEPAKOTE ER) 500 mg ER tablet Take 1 Tab by mouth two (2) times a day for 30 days. 60 Tab 2   ??? lacosamide (VIMPAT) 150 mg tab tablet TAKE ONE TABLET BY MOUTH TWICE DAILY 180 Tab 3   ??? levETIRAcetam (KEPPRA) 500 mg tablet Take 3 Tabs by mouth two (2) times a day for 30 days. 540 Tab 3   ??? medroxyPROGESTERone (PROVERA) 5 mg tablet Take 5 mg by mouth daily.     ??? calcium-cholecalciferol, d3, (CALCIUM 600 + D) 600-125 mg-unit tab Take 500 mg by mouth daily.     ??? levETIRAcetam (KEPPRA) 250 mg tablet  Take 1 Tab by mouth two (2) times a day. 180 Tab 3     Allergies   Allergen Reactions   ??? Aptiom [Eslicarbazepine] Seizures   ??? Dilantin [Phenytoin Sodium Extended] Rash   ??? Fycompa [Perampanel] Other (comments)     [place] OUT     Patient Active Problem List   Diagnosis Code   ??? Epilepsy without status epilepticus, not intractable ([place]) G40.909   ??? Depression F32.9   ??? Cystitis N30.90       Review of Systems:  Review of Systems   Constitutional: Negative for chills, diaphoresis, fever, malaise/fatigue and weight loss.   HENT: Negative for hearing loss and tinnitus.    Eyes: Negative for blurred vision, double vision, photophobia, pain, discharge and redness.   Respiratory: Negative for cough, hemoptysis, sputum production, shortness of breath and wheezing.    Cardiovascular: Negative for chest pain, palpitations, orthopnea, claudication, leg swelling and PND.   Gastrointestinal: Negative for abdominal pain, blood in stool, constipation, diarrhea, heartburn, melena, nausea and vomiting.   Genitourinary: Negative for dysuria, flank pain, frequency, hematuria and urgency.   Musculoskeletal: Negative for back pain, falls, joint pain, myalgias and neck pain.   Neurological: Negative for dizziness, tingling, tremors, sensory change, speech change, focal weakness, seizures, loss of consciousness, weakness and headaches.   Endo/Heme/Allergies: Negative for environmental  allergies and polydipsia. Does not bruise/bleed easily.   Psychiatric/Behavioral: Negative for depression, hallucinations, memory loss, substance abuse and suicidal ideas. The patient is not nervous/anxious and does not have insomnia.        Objective:     Vitals:    03/19/18 1450   BP: 120/64   Pulse: 70   Resp: 18   SpO2: 99%   Weight: 61.8 kg (136 lb 3.2 oz)   Height: 6\' 1"  (1.854 m)   PainSc:   0 - No pain     Body mass index is 17.97 kg/m??.    Physical Exam:  Physical Exam   Constitutional: She is oriented to person, place, and time. She appears well-developed and well-nourished.   HENT:   Head: Normocephalic and atraumatic.   Right Ear: External ear normal.   Left Ear: External ear normal.   Nose: Nose normal.   Mouth/Throat: Oropharynx is clear and moist.   Eyes: Pupils are equal, round, and reactive to light. Conjunctivae and EOM are normal.   Neck: Normal range of motion. Neck supple.   Cardiovascular: Normal rate, regular rhythm, normal heart sounds and intact distal pulses.   Pulmonary/Chest: Effort normal and breath sounds normal.   Abdominal: Soft. Bowel sounds are normal.   Musculoskeletal: Normal range of motion.   Neurological: She is alert and oriented to person, place, and time. She has normal reflexes.   Skin: Skin is warm and dry.   Psychiatric: She has a normal mood and affect. Her behavior is normal. Judgment and thought content normal.   Nursing note and vitals reviewed.        Impression:       ICD-10-CM ICD-9-CM    1. Seizure disorder, temporal lobe, intractable (HCC) G40.219 345.41 divalproex ER (DEPAKOTE ER) 500 mg ER tablet      levETIRAcetam (KEPPRA) 500 mg tablet      DISCONTINUED: divalproex ER (DEPAKOTE ER) 500 mg ER tablet      DISCONTINUED: levETIRAcetam (KEPPRA) 500 mg tablet   2. Partial symptomatic epilepsy with complex partial seizures, intractable, without status epilepticus ([facility]) G40.219 345.41 lacosamide (VIMPAT) 150 mg  tab tablet      levETIRAcetam (KEPPRA) 500 mg tablet       DISCONTINUED: lacosamide (VIMPAT) 150 mg tab tablet      DISCONTINUED: levETIRAcetam (KEPPRA) 500 mg tablet   3. Seizures ([facility]CC) R56.9 780.39 levETIRAcetam (KEPPRA) 500 mg tablet      DISCONTINUED: levETIRAcetam (KEPPRA) 500 mg tablet        Plan:     Orders Placed This Encounter   ??? DISCONTD: divalproex ER (DEPAKOTE ER) 500 mg ER tablet   ??? DISCONTD: lacosamide (VIMPAT) 150 mg tab tablet   ??? DISCONTD: levETIRAcetam (KEPPRA) 500 mg tablet   ??? divalproex ER (DEPAKOTE ER) 500 mg ER tablet   ??? lacosamide (VIMPAT) 150 mg tab tablet   ??? levETIRAcetam (KEPPRA) 500 mg tablet         Follow-up and Dispositions    ?? Return in about 6 weeks (around 04/30/2018).           Signed By:  Graciella Freer, MD     03/19/2018

## 2018-03-19 NOTE — Patient Instructions (Signed)
[facility]  Office working hours are Monday - Thursday 9:00 am to 5:00 pm.    Office phone number is [phone] and fax is [phone].  For non-emergent medical care and clinical advice during office hours:   1. Call office or   2.  Send message or request using MyChart  For non-emergent medical care and clinical advice after office hours:  1.  Call [phone] or  2.  Send message or request using MyChart  Emergency care can be obtained at the [facility] ER, Urgent Care or calling 911.    Patient Satisfaction Survey  We appreciate you giving us your e-mail address.  Please watch for our patient satisfaction survey which you will receive by e-mail.  We strive to provide you with the best care possible.  We respect all comments and will take comments into consideration to improve our service.  Thank you for your participation.

## 2018-03-19 NOTE — Addendum Note (Signed)
Addended by: Johna Sheriff on: 03/20/2018 12:34 PM     Modules accepted: Orders

## 2018-03-19 NOTE — Addendum Note (Signed)
Addended by: Johna Sheriff on: 03/20/2018 12:37 PM     Modules accepted: Orders

## 2018-03-19 NOTE — Progress Notes (Signed)
[facility], MD  [address] [place] [address]  Phone:  [phone]  Fax:  [phone]      Patient ID  Name:  Natasha Herman  DOB:  [birth year]  MRN:  [id number]  Age:  52 y.o.  Sex:  female   PCP:  Claris Gower, MD    Subjective:     Encounter Date:  03/19/2018    Chief Complaint   Patient presents with   ??? Seizure   ??? Medication Refill       History of Present Illness:   F/u case of Complex partial Sz   - has had Left Temporal Lobectomy    Pt has increased frequency of Sz      Past Medical History:   Diagnosis Date   ??? Depression    ??? Epilepsy ([place])    ??? Seizures ([place])     DAILY MORE THAN ONE A DAY     Past Surgical History:   Procedure Laterality Date   ??? BREAST SURGERY PROCEDURE UNLISTED Left     lumpectomy-2003 BENIGN   ??? COLONOSCOPY N/A 01/14/2016    COLONOSCOPY performed by [name], MD at [facility]   ??? HX CAROTID ENDARTERECTOMY Left    ??? HX CRANIOTOMY      2014 -- L temperol lobe    ??? HX CYST REMOVAL      R. Bartolin Cyst-2001   ??? HX HEENT      wisdom teeth -1995     Family History   Problem Relation Age of Onset   ??? Cancer Mother         Breast   ??? Diabetes Mother    ??? Hypertension Mother    ??? Parkinsonism Father    ??? Diabetes Sister    ??? Hypertension Sister    ??? Diabetes Maternal Grandmother      Social History     Socioeconomic History   ??? Marital status: MARRIED     Spouse name: Not on file   ??? Number of children: 1   ??? Years of education: Not on file   ??? Highest education level: Not on file   Occupational History   ??? Occupation: Payroll   Tobacco Use   ??? Smoking status: Never Smoker   ??? Smokeless tobacco: Never Used   Substance and Sexual Activity   ??? Alcohol use: No     Alcohol/week: 0.0 standard drinks   ??? Drug use: No   ??? Sexual activity: Never   Social History Narrative    Patient does drive       Current Outpatient Medications   Medication Sig Dispense Refill    ??? Calcium Carbonate-Vit D3-Min 600 mg calcium- 400 unit tab Take  by mouth.     ??? divalproex ER (DEPAKOTE ER) 500 mg ER tablet Take 1 Tab by mouth two (2) times a day for 30 days. 60 Tab 2   ??? lacosamide (VIMPAT) 150 mg tab tablet TAKE ONE TABLET BY MOUTH TWICE DAILY 180 Tab 3   ??? levETIRAcetam (KEPPRA) 500 mg tablet Take 3 Tabs by mouth two (2) times a day for 30 days. 540 Tab 3   ??? medroxyPROGESTERone (PROVERA) 5 mg tablet Take 5 mg by mouth daily.     ??? calcium-cholecalciferol, d3, (CALCIUM 600 + D) 600-125 mg-unit tab Take 500 mg by mouth daily.     ??? levETIRAcetam (KEPPRA) 250 mg tablet  Take 1 Tab by mouth two (2) times a day. 180 Tab 3     Allergies   Allergen Reactions   ??? Aptiom [Eslicarbazepine] Seizures   ??? Dilantin [Phenytoin Sodium Extended] Rash   ??? Fycompa [Perampanel] Other (comments)     [place] OUT     Patient Active Problem List   Diagnosis Code   ??? Epilepsy without status epilepticus, not intractable ([place]) G40.909   ??? Depression F32.9   ??? Cystitis N30.90       Review of Systems:  Review of Systems   Constitutional: Negative for chills, diaphoresis, fever, malaise/fatigue and weight loss.   HENT: Negative for hearing loss and tinnitus.    Eyes: Negative for blurred vision, double vision, photophobia, pain, discharge and redness.   Respiratory: Negative for cough, hemoptysis, sputum production, shortness of breath and wheezing.    Cardiovascular: Negative for chest pain, palpitations, orthopnea, claudication, leg swelling and PND.   Gastrointestinal: Negative for abdominal pain, blood in stool, constipation, diarrhea, heartburn, melena, nausea and vomiting.   Genitourinary: Negative for dysuria, flank pain, frequency, hematuria and urgency.   Musculoskeletal: Negative for back pain, falls, joint pain, myalgias and neck pain.   Neurological: Negative for dizziness, tingling, tremors, sensory change, speech change, focal weakness, seizures, loss of consciousness, weakness  and headaches.   Endo/Heme/Allergies: Negative for environmental allergies and polydipsia. Does not bruise/bleed easily.   Psychiatric/Behavioral: Negative for depression, hallucinations, memory loss, substance abuse and suicidal ideas. The patient is not nervous/anxious and does not have insomnia.        Objective:     Vitals:    03/19/18 1450   BP: 120/64   Pulse: 70   Resp: 18   SpO2: 99%   Weight: 61.8 kg (136 lb 3.2 oz)   Height: 6\' 1"  (1.854 m)   PainSc:   0 - No pain     Body mass index is 17.97 kg/m??.    Physical Exam:  Physical Exam   Constitutional: She is oriented to person, place, and time. She appears well-developed and well-nourished.   HENT:   Head: Normocephalic and atraumatic.   Right Ear: External ear normal.   Left Ear: External ear normal.   Nose: Nose normal.   Mouth/Throat: Oropharynx is clear and moist.   Eyes: Pupils are equal, round, and reactive to light. Conjunctivae and EOM are normal.   Neck: Normal range of motion. Neck supple.   Cardiovascular: Normal rate, regular rhythm, normal heart sounds and intact distal pulses.   Pulmonary/Chest: Effort normal and breath sounds normal.   Abdominal: Soft. Bowel sounds are normal.   Musculoskeletal: Normal range of motion.   Neurological: She is alert and oriented to person, place, and time. She has normal reflexes.   Skin: Skin is warm and dry.   Psychiatric: She has a normal mood and affect. Her behavior is normal. Judgment and thought content normal.   Nursing note and vitals reviewed.        Impression:       ICD-10-CM ICD-9-CM    1. Seizure disorder, temporal lobe, intractable (HCC) G40.219 345.41 divalproex ER (DEPAKOTE ER) 500 mg ER tablet      levETIRAcetam (KEPPRA) 500 mg tablet      DISCONTINUED: divalproex ER (DEPAKOTE ER) 500 mg ER tablet      DISCONTINUED: levETIRAcetam (KEPPRA) 500 mg tablet   2. Partial symptomatic epilepsy with complex partial seizures, intractable, without status epilepticus (HCC) G40.219 345.41 lacosamide  (VIMPAT) 150  mg tab tablet      levETIRAcetam (KEPPRA) 500 mg tablet      DISCONTINUED: lacosamide (VIMPAT) 150 mg tab tablet      DISCONTINUED: levETIRAcetam (KEPPRA) 500 mg tablet   3. Seizures (HCC) R56.9 780.39 levETIRAcetam (KEPPRA) 500 mg tablet      DISCONTINUED: levETIRAcetam (KEPPRA) 500 mg tablet        Plan:     Orders Placed This Encounter   ??? DISCONTD: divalproex ER (DEPAKOTE ER) 500 mg ER tablet   ??? DISCONTD: lacosamide (VIMPAT) 150 mg tab tablet   ??? DISCONTD: levETIRAcetam (KEPPRA) 500 mg tablet   ??? divalproex ER (DEPAKOTE ER) 500 mg ER tablet   ??? lacosamide (VIMPAT) 150 mg tab tablet   ??? levETIRAcetam (KEPPRA) 500 mg tablet         Follow-up and Dispositions    ?? Return in about 6 weeks (around 04/30/2018).           Signed By:  Graciella Freer, MD     03/19/2018

## 2018-03-20 ENCOUNTER — Institutional Professional Consult (permissible substitution): Admit: 2018-03-20 | Discharge: 2018-03-20 | Payer: PRIVATE HEALTH INSURANCE | Primary: Family Medicine

## 2018-03-20 DIAGNOSIS — G40209 Localization-related (focal) (partial) symptomatic epilepsy and epileptic syndromes with complex partial seizures, not intractable, without status epilepticus: Secondary | ICD-10-CM

## 2018-03-20 DIAGNOSIS — G40219 Localization-related (focal) (partial) symptomatic epilepsy and epileptic syndromes with complex partial seizures, intractable, without status epilepticus: Secondary | ICD-10-CM

## 2018-03-20 NOTE — Progress Notes (Signed)
Venipuncture performed. 1 plain red and 1 yellow top tube collected.

## 2018-03-20 NOTE — Patient Instructions (Signed)
MyChart Activation    Thank you for requesting access to MyChart. Please follow the instructions below to securely access and download your online medical record. MyChart allows you to send messages to your doctor, view your test results, renew your prescriptions, schedule appointments, and more.    How Do I Sign Up?    1. In your internet browser, go to [email or web address]  2. Click on the First Time User? Click Here link in the Sign In box. You will be redirect to the New Member Sign Up page.  3. Enter your MyChart Access Code exactly as it appears below. You will not need to use this code after you???ve completed the sign-up process. If you do not sign up before the expiration date, you must request a new code.    MyChart Access Code: Activation code not generated  Current MyChart Status: Active (This is the date your MyChart access code will expire)    4. Enter the last four digits of your Social Security Number (xxxx) and Date of Birth (mm/dd/yyyy) as indicated and click Submit. You will be taken to the next sign-up page.  5. Create a MyChart ID. This will be your MyChart login ID and cannot be changed, so think of one that is secure and easy to remember.  6. Create a MyChart password. You can change your password at any time.  7. Enter your Password Reset Question and Answer. This can be used at a later time if you forget your password.   8. Enter your e-mail address. You will receive e-mail notification when new information is available in MyChart.  9. Click Sign Up. You can now view and download portions of your medical record.  10. Click the Download Summary menu link to download a portable copy of your medical information.    Additional Information    If you have questions, please visit the Frequently Asked Questions section of the MyChart website at [email or web address] Remember, MyChart is NOT to be used for urgent needs. For medical emergencies, dial 911.       [facility]  Office working hours are Monday - Friday 8:30 am to 4 pm.  Saturday 9 am to 12 pm for acute illnesses only for established patients.  Office phone number is [phone] and fax is [phone].  For non-emergent medical care and clinical advice during office hours:   1. Call office or   2. Send message or request using MyChart  For non-emergent medical care and clinical advice after office hours:  1. Call [phone] or  2. Send message or request using MyChart  Emergency care can be obtained at the [facility] ER, Urgent Care or calling 911.    Patient Satisfaction Survey  We appreciate you giving us your e-mail address. Please watch for our patient satisfaction survey which you will receive by e-mail. We strive to provide you with the best care possible. We respect all comments and will take comments into consideration to improve our service.  Thank you for your participation.    As a valued patient, you will be receiving a survey from [employer].  We encourage you to share your thoughts and opinions about the care you received today.  Thank you for choosing [facility].

## 2018-03-21 ENCOUNTER — Inpatient Hospital Stay: Admit: 2018-03-21 | Payer: BLUE CROSS/BLUE SHIELD | Primary: Family Medicine

## 2018-03-21 LAB — HEPATIC FUNCTION PANEL
A-G Ratio: 1.6 (ref 1.2–2.2)
ALT (SGPT): 18 U/L (ref 12–78)
ALT: 18 U/L (ref 12–78)
AST (SGOT): 12 U/L — ABNORMAL LOW (ref 15–37)
AST: 12 U/L — ABNORMAL LOW (ref 15–37)
Albumin/Globulin Ratio: 1.6 (ref 1.2–2.2)
Albumin: 5 g/dL (ref 3.4–5.0)
Albumin: 5 g/dL (ref 3.4–5.0)
Alk. phosphatase: 65 U/L (ref 45–117)
Alkaline Phosphatase: 65 U/L (ref 45–117)
Bilirubin, Direct: 0.2 MG/DL (ref 0.0–0.2)
Bilirubin, direct: 0.2 MG/DL (ref 0.0–0.2)
Bilirubin, total: 0.4 MG/DL (ref <1.1)
Globulin: 3.1 g/dL (ref 2.4–3.5)
Globulin: 3.1 g/dL (ref 2.4–3.5)
Protein, total: 8.1 g/dL (ref 6.4–8.2)
Total Bilirubin: 0.4 MG/DL (ref <1.1)
Total Protein: 8.1 g/dL (ref 6.4–8.2)

## 2018-03-24 LAB — LEVETIRACETAM (KEPPRA)
KEPPRA,KEPP: 53.9 ug/mL — ABNORMAL HIGH (ref 10.0–40.0)
Levetiracetam (Keppra): 53.9 ug/mL — ABNORMAL HIGH (ref 10.0–40.0)

## 2018-04-02 MED ORDER — DIVALPROEX  250 MG 24 HR TAB
250 mg | ORAL_TABLET | Freq: Three times a day (TID) | ORAL | 5 refills | Status: AC
Start: 2018-04-02 — End: ?

## 2018-04-02 MED ORDER — LEVETIRACETAM ER 500 MG 24 HR TAB
500 mg | ORAL_TABLET | Freq: Three times a day (TID) | ORAL | 1 refills | Status: AC
Start: 2018-04-02 — End: ?

## 2018-04-02 NOTE — Telephone Encounter (Signed)
Pt phoned today stating her seizure activity has increased.  Per dr. Wandalee Ferdinand can try titrating slowly up to dosing with depakote er 250 or come in today for an appointment to see him.  Patient wants to try the 250 first then proceed from there.

## 2018-04-02 NOTE — Telephone Encounter (Signed)
patient phoned yesterday stating unable to function on the Depakote 500 as prescribed.  Requesting decrease to 250 to slowly taper up.

## 2018-04-18 NOTE — Progress Notes (Signed)
On 04/02/18 while giving patient instruction over the phone, patients states she's not sure she is confident in his expertise in treating her epilepsy.

## 2018-04-26 ENCOUNTER — Encounter: Attending: Neurology | Primary: Family Medicine

## 2018-06-10 ENCOUNTER — Telehealth

## 2018-06-10 NOTE — Telephone Encounter (Signed)
Had been going to Dr. Glee Arvin. Said she does not like his replacement and will not be going back to him.  Wants to be referred to Dr. Clabe Seal at [facility]. Michela Pitcher he is the only one in the area that treats epilepsy.

## 2018-06-11 NOTE — Telephone Encounter (Signed)
Referral placed.

## 2018-06-11 NOTE — Telephone Encounter (Signed)
Ok to send requested referral.

## 2018-06-18 ENCOUNTER — Other Ambulatory Visit: Payer: Self-pay | Admitting: Family Medicine

## 2018-06-18 ENCOUNTER — Other Ambulatory Visit (HOSPITAL_COMMUNITY)
Admission: RE | Admit: 2018-06-18 | Discharge: 2018-06-18 | Disposition: A | Discharge status: Home / Self Care | Payer: 59 | Source: Ambulatory Visit | Attending: Family Medicine | Admitting: Family Medicine

## 2018-06-18 DIAGNOSIS — I10 Essential (primary) hypertension: Secondary | ICD-10-CM | POA: Diagnosis not present

## 2018-06-18 DIAGNOSIS — R102 Pelvic and perineal pain: Secondary | ICD-10-CM | POA: Diagnosis not present

## 2018-06-18 DIAGNOSIS — Z720 Tobacco use: Secondary | ICD-10-CM | POA: Diagnosis not present

## 2018-06-18 DIAGNOSIS — Z01419 Encounter for gynecological examination (general) (routine) without abnormal findings: Secondary | ICD-10-CM | POA: Diagnosis not present

## 2018-06-18 DIAGNOSIS — Z01411 Encounter for gynecological examination (general) (routine) with abnormal findings: Secondary | ICD-10-CM | POA: Insufficient documentation

## 2018-06-24 LAB — CYTOLOGY - PAP
DIAGNOSIS: NEGATIVE
HPV (WINDOPATH): NOT DETECTED

## 2018-07-02 DIAGNOSIS — Z1211 Encounter for screening for malignant neoplasm of colon: Secondary | ICD-10-CM | POA: Diagnosis not present

## 2018-07-02 DIAGNOSIS — I10 Essential (primary) hypertension: Secondary | ICD-10-CM | POA: Diagnosis not present

## 2018-07-09 DIAGNOSIS — R102 Pelvic and perineal pain: Secondary | ICD-10-CM | POA: Diagnosis not present

## 2018-08-08 NOTE — Telephone Encounter (Signed)
Had diarrhea awhile back.  Then developed fatigue, clear drainage and low grade fever.  During the day starts running a fever off and on. Starts coughing really bad at night. Still feels really tired but walks about two miles a day. Yellow mucous now. Non-productive cough.

## 2018-08-08 NOTE — Telephone Encounter (Signed)
Patient advised.

## 2018-08-08 NOTE — Telephone Encounter (Signed)
Likely viral- recommend supportive care- OTC Mucinex for cough, increase fluids, Tylenol for fever. If no improvement in symptoms after one week of supportive care will need to be evaluated in the office.

## 2018-08-26 ENCOUNTER — Encounter: Attending: Family Medicine | Primary: Family Medicine

## 2018-10-29 ENCOUNTER — Encounter: Attending: Family Medicine | Primary: Family Medicine

## 2018-11-12 ENCOUNTER — Encounter: Attending: Family Medicine | Primary: Family Medicine

## 2019-05-12 ENCOUNTER — Other Ambulatory Visit: Payer: Self-pay | Admitting: Family Medicine

## 2019-05-12 DIAGNOSIS — Z1231 Encounter for screening mammogram for malignant neoplasm of breast: Secondary | ICD-10-CM

## 2019-05-15 ENCOUNTER — Other Ambulatory Visit: Payer: Self-pay

## 2019-05-15 ENCOUNTER — Ambulatory Visit (HOSPITAL_COMMUNITY)
Admission: EM | Admit: 2019-05-15 | Discharge: 2019-05-15 | Disposition: A | Discharge status: Home / Self Care | Payer: 59 | Attending: Family Medicine | Admitting: Family Medicine

## 2019-05-15 DIAGNOSIS — R059 Cough, unspecified: Secondary | ICD-10-CM

## 2019-05-15 DIAGNOSIS — R05 Cough: Secondary | ICD-10-CM | POA: Diagnosis not present

## 2019-05-15 DIAGNOSIS — I10 Essential (primary) hypertension: Secondary | ICD-10-CM

## 2019-05-15 DIAGNOSIS — R062 Wheezing: Secondary | ICD-10-CM

## 2019-05-15 MED ORDER — AZITHROMYCIN 250 MG PO TABS: 250.0000 mg | ORAL_TABLET | Freq: Every day | ORAL | 0 refills | Status: DC

## 2019-05-15 MED ORDER — HYDROCHLOROTHIAZIDE 25 MG PO TABS: 25.0000 mg | ORAL_TABLET | Freq: Every day | ORAL | 2 refills | Status: DC

## 2019-05-15 MED ORDER — PREDNISONE 10 MG (21) PO TBPK: ORAL_TABLET | Freq: Every day | ORAL | 0 refills | Status: DC

## 2019-05-15 NOTE — ED Provider Notes (Signed)
Judith Pollard   [id number] 05/15/19 Arrival Time: [place] PLAN:  1. Cough   2. Wheezing   3. Uncontrolled hypertension     Given duration of coughing will treat as below. Quit smoking approx 4-5 months ago. No s/s of hypertensive urgency.  Meds ordered this encounter  Medications  . hydrochlorothiazide (HYDRODIURIL) 25 MG tablet    Sig: Take 1 tablet (25 mg total) by mouth daily.    Dispense:  30 tablet    Refill:  2  . azithromycin (ZITHROMAX) 250 MG tablet    Sig: Take 1 tablet (250 mg total) by mouth daily. Take first 2 tablets together, then 1 every day until finished.    Dispense:  6 tablet    Refill:  0  . predniSONE (STERAPRED UNI-PAK 21 TAB) 10 MG (21) TBPK tablet    Sig: Take by mouth daily. Take as directed.    Dispense:  21 tablet    Refill:  0    Encouraged follow up with PCP or establishment of care with a PCP. May return here to recheck BP at any time.  Prefers OTC cough medication. No indication for chest imaging at this time. OTC symptom care as needed. Ensure adequate fluid intake and rest. May f/u with PCP or here as needed.  Reviewed expectations re: course of current medical issues. Questions answered. Outlined signs and symptoms indicating need for more acute intervention. Patient verbalized understanding. After Visit Summary given.   SUBJECTIVE: History from: patient.  Judith Pollard is a 53 y.o. female who presents with complaint of "having a cold a couple of months ago"; tested negative for COVID. Now with lingering cough occasionally productive of thick sputum. No CP or SOB associated. No significant fatigue. No body aches. Questions wheezing at times. Quit smoking approx 4-5 months ago. Fever: none reported or suspected. Overall normal PO intake without n/v. Known sick contacts or COVID-19 exposure: no. No specific or significant aggravating or alleviating factors reported. OTC treatment: cough medicine without moderate  relief.  Increased blood pressure noted today. Reports that she is treated for HTN but out of her HCTZ. Requests refill.  She reports no chest pain on exertion, no dyspnea on exertion, no swelling of ankles, no orthostatic dizziness or lightheadedness, no orthopnea or paroxysmal nocturnal dyspnea, no palpitations and no intermittent claudication symptoms.  ROS: As per HPI. All other systems negative.    OBJECTIVE:  Vitals:   05/15/19 1348  BP: (!) 164/103  Pulse: 84  Resp: 18  Temp: 98.3 F (36.8 C)  TempSrc: Oral  SpO2: 99%     General appearance: alert; NAD HEENT: nasal congestion; clear runny nose; throat irritation secondary to post-nasal drainage Neck: supple without LAD CV: RRR Lungs: unlabored respirations, symmetrical air entry with mild bilateral expiratory wheezing; cough: mild, dry Abd: soft Ext: no LE edema Skin: warm and dry Psychological: alert and cooperative; normal mood and affect   No Known Allergies  Past Medical History:  Diagnosis Date  . Hypertension    FH: Question of HTN  Social History   Socioeconomic History  . Marital status: Married    Spouse name: Not on file  . Number of children: Not on file  . Years of education: Not on file  . Highest education level: Not on file  Occupational History  . Not on file  Tobacco Use  . Smoking status: Current Every Day Smoker  . Smokeless tobacco: Never Used  Substance and Sexual Activity  .  Alcohol use: Yes    Comment: social  . Drug use: Not on file  . Sexual activity: Not on file  Other Topics Concern  . Not on file  Social History Narrative  . Not on file   Social Determinants of Health   Financial Resource Strain:   . Difficulty of Paying Living Expenses: Not on file  Food Insecurity:   . Worried About Programme researcher, broadcasting/film/video in the Last Year: Not on file  . Ran Out of Food in the Last Year: Not on file  Transportation Needs:   . Lack of Transportation (Medical): Not on file  .  Lack of Transportation (Non-Medical): Not on file  Physical Activity:   . Days of Exercise per Week: Not on file  . Minutes of Exercise per Session: Not on file  Stress:   . Feeling of Stress : Not on file  Social Connections:   . Frequency of Communication with Friends and Family: Not on file  . Frequency of Social Gatherings with Friends and Family: Not on file  . Attends Religious Services: Not on file  . Active Member of Clubs or Organizations: Not on file  . Attends Banker Meetings: Not on file  . Marital Status: Not on file  Intimate Partner Violence:   . Fear of Current or Ex-Partner: Not on file  . Emotionally Abused: Not on file  . Physically Abused: Not on file  . Sexually Abused: Not on file           Mardella Layman, MD 05/15/19 [phone]

## 2019-05-15 NOTE — ED Triage Notes (Signed)
Pt c/o persistent prod cough onset 2 months.... Was tested for COVID 2 months ago and it was neg.   Denies fevers, SOB/dyspnea  BP today = 164/103 ... has not had BP meds > 1 month  A&O x4... NAD.Marland Kitchen. ambulatory

## 2019-06-26 ENCOUNTER — Encounter: Payer: Self-pay | Admitting: Critical Care Medicine

## 2019-06-26 ENCOUNTER — Ambulatory Visit (INDEPENDENT_AMBULATORY_CARE_PROVIDER_SITE_OTHER): Payer: 59 | Admitting: Critical Care Medicine

## 2019-06-26 VITALS — BP 149/100 | HR 83 | Temp 97.3°F | Resp 17 | Ht 61.0 in | Wt 229.8 lb

## 2019-06-26 DIAGNOSIS — I10 Essential (primary) hypertension: Secondary | ICD-10-CM | POA: Diagnosis not present

## 2019-06-26 DIAGNOSIS — G4710 Hypersomnia, unspecified: Secondary | ICD-10-CM

## 2019-06-26 DIAGNOSIS — J3089 Other allergic rhinitis: Secondary | ICD-10-CM | POA: Diagnosis not present

## 2019-06-26 DIAGNOSIS — Z23 Encounter for immunization: Secondary | ICD-10-CM | POA: Diagnosis not present

## 2019-06-26 DIAGNOSIS — G4730 Sleep apnea, unspecified: Secondary | ICD-10-CM

## 2019-06-26 DIAGNOSIS — R059 Cough, unspecified: Secondary | ICD-10-CM | POA: Insufficient documentation

## 2019-06-26 DIAGNOSIS — Z1211 Encounter for screening for malignant neoplasm of colon: Secondary | ICD-10-CM

## 2019-06-26 DIAGNOSIS — Z1231 Encounter for screening mammogram for malignant neoplasm of breast: Secondary | ICD-10-CM

## 2019-06-26 DIAGNOSIS — Z114 Encounter for screening for human immunodeficiency virus [HIV]: Secondary | ICD-10-CM

## 2019-06-26 DIAGNOSIS — R05 Cough: Secondary | ICD-10-CM

## 2019-06-26 DIAGNOSIS — R739 Hyperglycemia, unspecified: Secondary | ICD-10-CM

## 2019-06-26 MED ORDER — FLUTICASONE PROPIONATE 50 MCG/ACT NA SUSP: 2.0000 | Freq: Every day | NASAL | 6 refills | Status: DC

## 2019-06-26 MED ORDER — LOSARTAN POTASSIUM-HCTZ 100-12.5 MG PO TABS: 1.0000 | ORAL_TABLET | Freq: Every day | ORAL | 3 refills | Status: DC

## 2019-06-26 NOTE — Assessment & Plan Note (Signed)
Significant morbid obesity and hypersomnia will require polysomnography we will order this

## 2019-06-26 NOTE — Assessment & Plan Note (Signed)
We discussed dietary changes for the patient and the fact she needs to increase her exercise level she may require nutritional counseling

## 2019-06-26 NOTE — Patient Instructions (Addendum)
Begin Flonase 2 sprays each nostril daily  Discontinue hydrochlorthiazide and begin losartan HCT daily for blood pressure  Focus on weight loss using a diet as outlined below  A sleep study will be scheduled  Mammogram will be scheduled  Colonoscopy will be deferred and we will order a Cologuard screening test will be mailed to your home  Labs today include metabolic panel A1c HIV complete blood count thyroid and lipid panels  Return in 2 months to meet Dr. Earlene Plater and to obtain a Pap smear

## 2019-06-26 NOTE — Assessment & Plan Note (Signed)
Chronic cough with significant allergic rhinitis  We will begin Flonase 2 sprays each nostril daily

## 2019-06-26 NOTE — Progress Notes (Signed)
Subjective:    Patient ID: Judith Pollard, female    DOB: [birth year], 54 y.o.   MRN: [id number]  This is a pleasant 54 year old female here today to establish primary care.  The patient comes in today for a general medical screen.  She works as an Theme park manager first shift.  She has been diagnosed previously with hypertension and is on hydrocal thiazide alone.  She also has a history of smoking but quit smoking in July 2020.  She notes she has a cough that has returned over the last 3 days the cough is dry.  She notes drainage from her nose is clear.  She does not have postnasal drip.  She wakes up with her mouth dry at night.  She has significant daytime hypersomnolence but denies headaches.  She has no change in memory.  She does not sleep well through the night.  She has to wake up to go to the bathroom frequently.  She denies any history of COPD or asthma.  She has had history of hyperglycemia but no formal diagnosis of diabetes.  She denies any wheezing.  She does have excess snoring.  Note coming into the office today her blood pressure was 149/100   Past Medical History:  Diagnosis Date  . Hypertension      Family History  Family history unknown: Yes     Social History   Socioeconomic History  . Marital status: Married    Spouse name: Not on file  . Number of children: Not on file  . Years of education: Not on file  . Highest education level: Not on file  Occupational History  . Not on file  Tobacco Use  . Smoking status: Former Smoker    Quit date: 12/16/2018    Years since quitting: 0.5  . Smokeless tobacco: Never Used  Substance and Sexual Activity  . Alcohol use: Yes    Comment: social  . Drug use: Not on file  . Sexual activity: Not on file  Other Topics Concern  . Not on file  Social History Narrative  . Not on file   Social Determinants of Health   Financial Resource Strain:   . Difficulty of Paying Living Expenses: Not on file  Food  Insecurity:   . Worried About Programme researcher, broadcasting/film/video in the Last Year: Not on file  . Ran Out of Food in the Last Year: Not on file  Transportation Needs:   . Lack of Transportation (Medical): Not on file  . Lack of Transportation (Non-Medical): Not on file  Physical Activity:   . Days of Exercise per Week: Not on file  . Minutes of Exercise per Session: Not on file  Stress:   . Feeling of Stress : Not on file  Social Connections:   . Frequency of Communication with Friends and Family: Not on file  . Frequency of Social Gatherings with Friends and Family: Not on file  . Attends Religious Services: Not on file  . Active Member of Clubs or Organizations: Not on file  . Attends Banker Meetings: Not on file  . Marital Status: Not on file  Intimate Partner Violence:   . Fear of Current or Ex-Partner: Not on file  . Emotionally Abused: Not on file  . Physically Abused: Not on file  . Sexually Abused: Not on file     No Known Allergies   Outpatient Medications Prior to Visit  Medication Sig Dispense Refill  . albuterol (  PROVENTIL HFA;VENTOLIN HFA) 108 (90 BASE) MCG/ACT inhaler Inhale 2 puffs into the lungs every 6 (six) hours as needed for wheezing or shortness of breath. 1 Inhaler 2  . azithromycin (ZITHROMAX) 250 MG tablet Take 1 tablet (250 mg total) by mouth daily. Take first 2 tablets together, then 1 every day until finished. 6 tablet 0  . dicyclomine (BENTYL) 20 MG tablet Take 1 tablet (20 mg total) by mouth 2 (two) times daily. 20 tablet 0  . guaiFENesin-codeine 100-10 MG/5ML syrup Take 5 mLs by mouth at bedtime as needed for cough. (Patient not taking: Reported on 03/23/2017) 120 mL 0  . hydrochlorothiazide (HYDRODIURIL) 25 MG tablet Take 1 tablet (25 mg total) by mouth daily. 30 tablet 2  . ipratropium (ATROVENT) 0.06 % nasal spray Place 2 sprays into both nostrils 4 (four) times daily. (Patient not taking: Reported on 03/23/2017) 15 mL 1  . naproxen sodium (ANAPROX)  220 MG tablet Take 220 mg by mouth 2 (two) times daily as needed (pain).    . predniSONE (STERAPRED UNI-PAK 21 TAB) 10 MG (21) TBPK tablet Take by mouth daily. Take as directed. 21 tablet 0   No facility-administered medications prior to visit.      Review of Systems  Constitutional: Positive for activity change, fatigue and unexpected weight change.  HENT: Positive for congestion, rhinorrhea and sneezing. Negative for postnasal drip, sinus pressure, sinus pain, sore throat, tinnitus, trouble swallowing and voice change.   Eyes: Negative.   Respiratory: Positive for apnea and cough. Negative for shortness of breath, wheezing and stridor.   Cardiovascular: Negative for chest pain, palpitations and leg swelling.  Gastrointestinal: Negative.   Genitourinary: Negative.   Musculoskeletal: Negative.   Skin: Negative.   Neurological: Negative.   Hematological: Negative.   Psychiatric/Behavioral: Negative.        Objective:   Physical Exam Vitals:   06/26/19 1441  BP: (!) 149/100  Pulse: 83  Resp: 17  Temp: (!) 97.3 F (36.3 C)  TempSrc: Temporal  SpO2: 97%  Weight: 229 lb 12.8 oz (104.2 kg)  Height: 5\' 1"  (1.549 m)    Gen: Pleasant, obese,  in no distress,  normal affect  ENT: No lesions,  mouth clear,  oropharynx clear, no postnasal drip, Nasal turbinate edema, no purulence  Neck: No JVD, no TMG, no carotid bruits  Lungs: No use of accessory muscles, no dullness to percussion, clear without rales or rhonchi  Cardiovascular: RRR, heart sounds normal, no murmur or gallops, no peripheral edema  Abdomen: soft and NT, no HSM,  BS normal  Musculoskeletal: No deformities, no cyanosis or clubbing  Neuro: alert, non focal  Skin: Warm, no lesions or rashes     Assessment & Plan:  I personally reviewed all images and lab data in the [facility] system as well as any outside material available during this office visit and agree with the  radiology impressions.   Essential  hypertension Essential hypertension not well controlled on hydrocal thiazide We will begin losartan HCT 100/12.5 daily  We will check thyroid function, lipid panel, complete metabolic panel and CBC  Cough Chronic cough with significant allergic rhinitis  We will begin Flonase 2 sprays each nostril daily  Hyperglycemia Previous labs that show tendency towards hyperglycemia  Plan will be for the patient to have an A1c checked  Hypersomnia with sleep apnea Significant morbid obesity and hypersomnia will require polysomnography we will order this  Morbid obesity (HCC) We discussed dietary changes for the patient and the  fact she needs to increase her exercise level she may require nutritional counseling   [place] was seen today for establish care, hypertension and cough.  Diagnoses and all orders for this visit:  Essential hypertension -     CBC with Differential/Platelet; Future -     Comprehensive metabolic panel -     Thyroid Panel With TSH -     CBC with Differential/Platelet  Colon cancer screening -     Cologuard  Hypersomnia with sleep apnea -     Nocturnal polysomnography; Future  Non-seasonal allergic rhinitis, unspecified trigger  Encounter for screening mammogram for malignant neoplasm of breast -     MM Digital Diagnostic Bilat; Future  Morbid obesity (HCC)  Hyperglycemia -     Lipid Panel -     Comprehensive metabolic panel -     Hemoglobin A1c  Encounter for screening for HIV -     HIV antibody (with reflex)  Cough  Need for Tdap vaccination -     Tdap vaccine greater than or equal to 7yo IM  Other orders -     fluticasone (FLONASE) 50 MCG/ACT nasal spray; Place 2 sprays into both nostrils daily. -     losartan-hydrochlorothiazide (HYZAAR) 100-12.5 MG tablet; Take 1 tablet by mouth daily.   Will also obtain a mammogram for breast cancer screening and we will send a Cologuard for colon cancer screening as well the patient received a tetanus  vaccine at this visit but she declined the flu vaccine

## 2019-06-26 NOTE — Assessment & Plan Note (Signed)
Essential hypertension not well controlled on hydrocal thiazide We will begin losartan HCT 100/12.5 daily  We will check thyroid function, lipid panel, complete metabolic panel and CBC

## 2019-06-26 NOTE — Assessment & Plan Note (Signed)
Previous labs that show tendency towards hyperglycemia  Plan will be for the patient to have an A1c checked

## 2019-06-27 ENCOUNTER — Other Ambulatory Visit: Payer: Self-pay | Admitting: Critical Care Medicine

## 2019-06-27 ENCOUNTER — Encounter: Payer: Self-pay | Admitting: Critical Care Medicine

## 2019-06-27 DIAGNOSIS — R7303 Prediabetes: Secondary | ICD-10-CM | POA: Insufficient documentation

## 2019-06-27 LAB — COMPREHENSIVE METABOLIC PANEL
ALT: 17 IU/L (ref 0–32)
AST: 15 IU/L (ref 0–40)
Albumin/Globulin Ratio: 1.5 (ref 1.2–2.2)
Albumin: 4.4 g/dL (ref 3.8–4.9)
Alkaline Phosphatase: 85 IU/L (ref 39–117)
BUN/Creatinine Ratio: 18 (ref 9–23)
BUN: 15 mg/dL (ref 6–24)
Bilirubin Total: 0.2 mg/dL (ref 0.0–1.2)
CO2: 25 mmol/L (ref 20–29)
Calcium: 11.7 mg/dL — ABNORMAL HIGH (ref 8.7–10.2)
Chloride: 103 mmol/L (ref 96–106)
Creatinine, Ser: 0.82 mg/dL (ref 0.57–1.00)
GFR calc Af Amer: 94 mL/min/{1.73_m2} (ref >59)
GFR calc non Af Amer: 81 mL/min/{1.73_m2} (ref >59)
Globulin, Total: 2.9 g/dL (ref 1.5–4.5)
Glucose: 96 mg/dL (ref 65–99)
Potassium: 4.2 mmol/L (ref 3.5–5.2)
Sodium: 138 mmol/L (ref 134–144)
Total Protein: 7.3 g/dL (ref 6.0–8.5)

## 2019-06-27 LAB — CBC WITH DIFFERENTIAL/PLATELET
Basophils Absolute: 0 10*3/uL (ref 0.0–0.2)
Basos: 1 %
EOS (ABSOLUTE): 0.2 10*3/uL (ref 0.0–0.4)
Eos: 3 %
Hematocrit: 40.6 % (ref 34.0–46.6)
Hemoglobin: 12.9 g/dL (ref 11.1–15.9)
Immature Grans (Abs): 0 10*3/uL (ref 0.0–0.1)
Immature Granulocytes: 0 %
Lymphocytes Absolute: 2 10*3/uL (ref 0.7–3.1)
Lymphs: 26 %
MCH: 26.6 pg (ref 26.6–33.0)
MCHC: 31.8 g/dL (ref 31.5–35.7)
MCV: 84 fL (ref 79–97)
Monocytes Absolute: 0.6 10*3/uL (ref 0.1–0.9)
Monocytes: 8 %
Neutrophils Absolute: 4.7 10*3/uL (ref 1.4–7.0)
Neutrophils: 62 %
Platelets: 365 10*3/uL (ref 150–450)
RBC: 4.85 x10E6/uL (ref 3.77–5.28)
RDW: 13.4 % (ref 11.7–15.4)
WBC: 7.5 10*3/uL (ref 3.4–10.8)

## 2019-06-27 LAB — HEMOGLOBIN A1C
Est. average glucose Bld gHb Est-mCnc: 131 mg/dL
Hgb A1c MFr Bld: 6.2 % — ABNORMAL HIGH (ref 4.8–5.6)

## 2019-06-27 LAB — LIPID PANEL
Chol/HDL Ratio: 3.1 ratio (ref 0.0–4.4)
Cholesterol, Total: 167 mg/dL (ref 100–199)
HDL: 54 mg/dL (ref >39)
LDL Chol Calc (NIH): 96 mg/dL (ref 0–99)
Triglycerides: 94 mg/dL (ref 0–149)
VLDL Cholesterol Cal: 17 mg/dL (ref 5–40)

## 2019-06-27 LAB — THYROID PANEL WITH TSH
Free Thyroxine Index: 1.5 (ref 1.2–4.9)
T3 Uptake Ratio: 28 % (ref 24–39)
T4, Total: 5.4 ug/dL (ref 4.5–12.0)
TSH: 1.7 u[IU]/mL (ref 0.450–4.500)

## 2019-06-27 LAB — HIV ANTIBODY (ROUTINE TESTING W REFLEX): HIV Screen 4th Generation wRfx: NONREACTIVE

## 2019-06-27 MED ORDER — METFORMIN HCL 500 MG PO TABS: 500.0000 mg | ORAL_TABLET | Freq: Every day | ORAL | 2 refills | Status: DC

## 2019-06-30 NOTE — Progress Notes (Signed)
Patient notified of results & recommendations. Expressed understanding.

## 2019-07-18 ENCOUNTER — Encounter: Payer: Self-pay | Admitting: General Practice

## 2019-07-18 LAB — COLOGUARD: Cologuard: NEGATIVE

## 2019-07-25 LAB — COLOGUARD: Cologuard: NEGATIVE

## 2019-08-04 ENCOUNTER — Telehealth: Payer: Self-pay | Admitting: General Practice

## 2019-08-04 NOTE — Telephone Encounter (Signed)
Patient calling in regards to ongoing wheezing and cough and headaches. Was seen at [facility] and they gave patient antibiotics and they worked for a while but now it's getting worse.  Please contact patient as soon as possible.

## 2019-08-04 NOTE — Telephone Encounter (Signed)
If no luck with Delford Field, please schedule patient with [facility].

## 2019-08-04 NOTE — Telephone Encounter (Signed)
Can we route this on to Dr. Delford Field? It looks like he saw her for chronic cough at end of Jan after her urgent care visit in December so he might have some thoughts. Otherwise, I'll need an appointment with her to evaluate.   Marcy Siren, D.O. Primary Care at [facility]  08/04/2019, 2:42 PM

## 2019-08-05 ENCOUNTER — Ambulatory Visit: Payer: 59 | Attending: Critical Care Medicine | Admitting: Critical Care Medicine

## 2019-08-05 ENCOUNTER — Other Ambulatory Visit: Payer: Self-pay

## 2019-08-05 ENCOUNTER — Encounter: Payer: Self-pay | Admitting: Critical Care Medicine

## 2019-08-05 DIAGNOSIS — J3089 Other allergic rhinitis: Secondary | ICD-10-CM

## 2019-08-05 DIAGNOSIS — J45991 Cough variant asthma: Secondary | ICD-10-CM | POA: Diagnosis not present

## 2019-08-05 DIAGNOSIS — R05 Cough: Secondary | ICD-10-CM

## 2019-08-05 DIAGNOSIS — R059 Cough, unspecified: Secondary | ICD-10-CM

## 2019-08-05 MED ORDER — PREDNISONE 10 MG PO TABS: ORAL_TABLET | ORAL | 0 refills | Status: DC

## 2019-08-05 MED ORDER — CETIRIZINE HCL 10 MG PO TABS: 10.0000 mg | ORAL_TABLET | Freq: Every day | ORAL | 11 refills | Status: DC

## 2019-08-05 MED ORDER — PROMETHAZINE-DM 6.25-15 MG/5ML PO SYRP: 5.0000 mL | ORAL_SOLUTION | Freq: Four times a day (QID) | ORAL | 0 refills | Status: DC | PRN

## 2019-08-05 MED ORDER — PROAIR RESPICLICK 108 (90 BASE) MCG/ACT IN AEPB: 2.0000 | INHALATION_SPRAY | Freq: Four times a day (QID) | RESPIRATORY_TRACT | 4 refills | Status: DC | PRN

## 2019-08-05 NOTE — Progress Notes (Signed)
Subjective:    Patient ID: Judith Pollard, female    DOB: 02/21/1966, 54 y.o.   MRN: [id number] Virtual Visit via Telephone Note  I connected with Judith Pollard on 08/06/19 at  4:00 PM EST by telephone and verified that I am speaking with the correct person using two identifiers.   Consent:  I discussed the limitations, risks, security and privacy concerns of performing an evaluation and management service by telephone and the availability of in person appointments. I also discussed with the patient that there may be a patient responsible charge related to this service. The patient expressed understanding and agreed to proceed.  Location of patient: The patient was at home  Location of provider: I was in my office  Persons participating in the televisit with the patient.   No one else on the call    History of Present Illness:  This is a pleasant 54 year old female here today to establish primary care.  The patient comes in today for a general medical screen.  She works as an Theme park manager first shift.  She has been diagnosed previously with hypertension and is on hydrocal thiazide alone.  She also has a history of smoking but quit smoking in July 2020.  She notes she has a cough that has returned over the last 3 days the cough is dry.  She notes drainage from her nose is clear.  She does not have postnasal drip.  She wakes up with her mouth dry at night.  She has significant daytime hypersomnolence but denies headaches.  She has no change in memory.  She does not sleep well through the night.  She has to wake up to go to the bathroom frequently.  She denies any history of COPD or asthma.  She has had history of hyperglycemia but no formal diagnosis of diabetes.  She denies any wheezing.  She does have excess snoring.  Note coming into the office today her blood pressure was 149/100  08/05/2019 This is an acute telehealth work in visit for this 54 year old female with chronic  cough.  She is going to be establishing with her new primary care provider at [facility] primary care Dr. Earlene Plater   Patient states her cough had improved somewhat with Flonase which we gave at the last visit however she is still having the cough.  The cough is occasionally productive.  See cough assessment below  Cough This is a chronic problem. The current episode started more than 1 month ago. The problem has been waxing and waning. The cough is productive of sputum. Associated symptoms include nasal congestion, postnasal drip, rhinorrhea and wheezing. Pertinent negatives include no chest pain, ear congestion, ear pain, fever, headaches, heartburn, hemoptysis, sore throat or shortness of breath. Associated symptoms comments: Drainage from nose. The symptoms are aggravated by fumes, dust, pollens, cold air and lying down (cough is worse at night). She has tried steroid inhaler for the symptoms. The treatment provided moderate relief.   Past Medical History:  Diagnosis Date  . Hypertension      Family History  Family history unknown: Yes     Social History   Socioeconomic History  . Marital status: Married    Spouse name: Not on file  . Number of children: Not on file  . Years of education: Not on file  . Highest education level: Not on file  Occupational History  . Not on file  Tobacco Use  . Smoking status: Former Smoker  Quit date: 12/16/2018    Years since quitting: 0.6  . Smokeless tobacco: Never Used  Substance and Sexual Activity  . Alcohol use: Yes    Comment: social  . Drug use: Not on file  . Sexual activity: Not on file  Other Topics Concern  . Not on file  Social History Narrative  . Not on file   Social Determinants of Health   Financial Resource Strain:   . Difficulty of Paying Living Expenses: Not on file  Food Insecurity:   . Worried About Charity fundraiser in the Last Year: Not on file  . Ran Out of Food in the Last Year: Not on file  Transportation  Needs:   . Lack of Transportation (Medical): Not on file  . Lack of Transportation (Non-Medical): Not on file  Physical Activity:   . Days of Exercise per Week: Not on file  . Minutes of Exercise per Session: Not on file  Stress:   . Feeling of Stress : Not on file  Social Connections:   . Frequency of Communication with Friends and Family: Not on file  . Frequency of Social Gatherings with Friends and Family: Not on file  . Attends Religious Services: Not on file  . Active Member of Clubs or Organizations: Not on file  . Attends Archivist Meetings: Not on file  . Marital Status: Not on file  Intimate Partner Violence:   . Fear of Current or Ex-Partner: Not on file  . Emotionally Abused: Not on file  . Physically Abused: Not on file  . Sexually Abused: Not on file     No Known Allergies   Outpatient Medications Prior to Visit  Medication Sig Dispense Refill  . fluticasone (FLONASE) 50 MCG/ACT nasal spray Place 2 sprays into both nostrils daily. 16 g 6  . losartan-hydrochlorothiazide (HYZAAR) 100-12.5 MG tablet Take 1 tablet by mouth daily. 30 tablet 3  . metFORMIN (GLUCOPHAGE) 500 MG tablet Take 1 tablet (500 mg total) by mouth daily with breakfast. 30 tablet 2   No facility-administered medications prior to visit.      Review of Systems  Constitutional: Positive for activity change, fatigue and unexpected weight change. Negative for fever.  HENT: Positive for congestion, postnasal drip, rhinorrhea and sneezing. Negative for ear pain, sinus pressure, sinus pain, sore throat, tinnitus, trouble swallowing and voice change.   Eyes: Negative.   Respiratory: Positive for apnea, cough and wheezing. Negative for hemoptysis, shortness of breath and stridor.   Cardiovascular: Negative for chest pain, palpitations and leg swelling.  Gastrointestinal: Negative.  Negative for heartburn.  Genitourinary: Negative.   Musculoskeletal: Negative.   Skin: Negative.    Neurological: Negative for headaches.  Hematological: Negative.   Psychiatric/Behavioral: Negative.        Objective:   Physical Exam There were no vitals filed for this visit.   This is a telephone visit there are no observations or physical exam Assessment & Plan:  I personally reviewed all images and lab data in the [facility] system as well as any outside material available during this office visit and agree with the  radiology impressions.   Asthma, cough variant I suspect the cough actually is an asthma variant precipitated by nonseasonal allergic rhinitis  Note the patient also has hypersomnia with sleep apnea  Plan for this patient be to add Zyrtec 10 mg daily and continue Flonase 2 sprays each nostril daily  The patient will also receive pulse dose of prednisone 40 mg  a day for 5 days then discontinue and an albuterol inhaler to take 2 puffs every 6 hours as needed for cough or wheezing  We will bring the patient back into the clinic for further pulmonary evaluation and she is encouraged to keep her primary care follow-up appointment with Dr. Earlene Plater   Diagnoses and all orders for this visit:  Asthma, cough variant  Non-seasonal allergic rhinitis due to other allergic trigger  Cough  Other orders -     predniSONE (DELTASONE) 10 MG tablet; Take 4 tablets daily for 5 days then stop -     Albuterol Sulfate (PROAIR RESPICLICK) 108 (90 Base) MCG/ACT AEPB; Inhale 2 puffs into the lungs every 6 (six) hours as needed. -     promethazine-dextromethorphan (PROMETHAZINE-DM) 6.25-15 MG/5ML syrup; Take 5 mLs by mouth 4 (four) times daily as needed for cough. -     cetirizine (ZYRTEC) 10 MG tablet; Take 1 tablet (10 mg total) by mouth daily.    Follow Up Instructions: Patient knows a follow-up visit will be achieved in April   I discussed the assessment and treatment plan with the patient. The patient was provided an opportunity to ask questions and all were answered. The patient  agreed with the plan and demonstrated an understanding of the instructions.   The patient was advised to call back or seek an in-person evaluation if the symptoms worsen or if the condition fails to improve as anticipated.  I provided 30 minutes of non-face-to-face time during this encounter  including  median intraservice time , review of notes, labs, imaging, medications  and explaining diagnosis and management to the patient .    Shan Levans, MD

## 2019-08-05 NOTE — Telephone Encounter (Signed)
Quianna   pls put this pt on my schedule for a phone visit at 430p as she is not off work until after 4p

## 2019-08-05 NOTE — Telephone Encounter (Signed)
Forwarding message to you in regards to patient ongoing cough and wheezing.   Please contact patient at earliest convenience.

## 2019-08-06 NOTE — Assessment & Plan Note (Signed)
I suspect the cough actually is an asthma variant precipitated by nonseasonal allergic rhinitis  Note the patient also has hypersomnia with sleep apnea  Plan for this patient be to add Zyrtec 10 mg daily and continue Flonase 2 sprays each nostril daily  The patient will also receive pulse dose of prednisone 40 mg a day for 5 days then discontinue and an albuterol inhaler to take 2 puffs every 6 hours as needed for cough or wheezing  We will bring the patient back into the clinic for further pulmonary evaluation and she is encouraged to keep her primary care follow-up appointment with Dr. Earlene Plater

## 2019-08-27 ENCOUNTER — Other Ambulatory Visit: Payer: Self-pay | Admitting: Pharmacist

## 2019-08-27 MED ORDER — METFORMIN HCL 500 MG PO TABS: 500.0000 mg | ORAL_TABLET | Freq: Every day | ORAL | 0 refills | Status: DC

## 2019-08-28 ENCOUNTER — Telehealth: Payer: Self-pay

## 2019-08-28 NOTE — Telephone Encounter (Signed)
Called patient to do their pre-visit COVID screening.  Call went to voicemail, which is full. Unable to do prescreening.  

## 2019-08-29 ENCOUNTER — Ambulatory Visit: Payer: 59 | Admitting: Internal Medicine

## 2019-09-04 ENCOUNTER — Telehealth: Payer: Self-pay

## 2019-09-04 NOTE — Telephone Encounter (Signed)

## 2019-09-08 ENCOUNTER — Encounter: Payer: Self-pay | Admitting: Internal Medicine

## 2019-09-08 ENCOUNTER — Ambulatory Visit (INDEPENDENT_AMBULATORY_CARE_PROVIDER_SITE_OTHER): Payer: 59 | Admitting: Internal Medicine

## 2019-09-08 ENCOUNTER — Other Ambulatory Visit: Payer: Self-pay

## 2019-09-08 ENCOUNTER — Other Ambulatory Visit (HOSPITAL_COMMUNITY)
Admission: RE | Admit: 2019-09-08 | Discharge: 2019-09-08 | Disposition: A | Discharge status: Home / Self Care | Payer: 59 | Source: Ambulatory Visit | Attending: Internal Medicine | Admitting: Internal Medicine

## 2019-09-08 VITALS — BP 120/84 | HR 87 | Temp 97.3°F | Resp 17 | Wt 233.0 lb

## 2019-09-08 DIAGNOSIS — Z124 Encounter for screening for malignant neoplasm of cervix: Secondary | ICD-10-CM | POA: Insufficient documentation

## 2019-09-08 NOTE — Progress Notes (Signed)
  Subjective:    Judith Pollard - 54 y.o. female MRN [id number]  Date of birth: 1965-09-24  HPI  ANALYS RYDEN is here for PAP smear. No concerns. Reports no known h/o abnormal PAP. Declines STD testing. Has not yet been able to schedule her mammogram.      Health Maintenance:  Health Maintenance Due  Topic Date Due  . MAMMOGRAM  08/31/2014    -  reports that she quit smoking about 8 months ago. She has never used smokeless tobacco. - Review of Systems: Per HPI. - Past Medical History: Patient Active Problem List   Diagnosis Date Noted  . Asthma, cough variant 08/05/2019  . Prediabetes 06/27/2019  . Cough 06/26/2019  . Essential hypertension 06/26/2019  . 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/26/2019  . Non-seasonal allergic rhinitis 06/26/2019  . Morbid obesity (HCC) 06/26/2019  . Hyperglycemia 06/26/2019   - Medications: reviewed and updated   Objective:   Physical Exam BP 120/84   Pulse 87   Temp (!) 97.3 F (36.3 C) (Temporal)   Resp 17   Wt 233 lb (105.7 kg)   LMP 05/19/2013   SpO2 96%   BMI 44.02 kg/m  Physical Exam  Constitutional: She is oriented to person, place, and time and well-developed, well-nourished, and in no distress. No distress.  Cardiovascular: Normal rate.  Pulmonary/Chest: Effort normal. No respiratory distress.  Genitourinary:    Genitourinary Comments: GU/GYN: Exam performed in the presence of a chaperone. External genitalia within normal limits.  Vaginal mucosa pink, moist, normal rugae.  Nonfriable cervix without lesions, no discharge or bleeding noted on speculum exam.  Bimanual exam revealed normal, nongravid uterus.  No cervical motion tenderness. No adnexal masses bilaterally.      Musculoskeletal:        General: Normal range of motion.  Neurological: She is alert and oriented to person, place, and time.  Skin: Skin is warm and dry. She is not diaphoretic.  Psychiatric: Affect and judgment normal.           Assessment & Plan:    1. Pap smear for cervical cancer screening - Cytology - PAP([place])     Marcy Siren, D.O. 09/08/2019, 4:02 PM Primary Care at [facility]

## 2019-09-08 NOTE — Patient Instructions (Signed)
Please call [facility] to schedule your mammogram: [phone]   Pap Test Why am I having this test? A Pap test, also called a Pap smear, is a screening test to check for signs of:  Cancer of the vagina, cervix, and uterus. The cervix is the lower part of the uterus that opens into the vagina.  Infection.  Changes that may be a sign that cancer is developing (precancerous changes). Women need this test on a regular basis. In general, you should have a Pap test every 3 years until you reach menopause or age 3. Women aged 30-60 may choose to have their Pap test done at the same time as an HPV (human papillomavirus) test every 5 years (instead of every 3 years). Your health care provider may recommend having Pap tests more or less often depending on your medical conditions and past Pap test results. What kind of sample is taken?  Your health care provider will collect a sample of cells from the surface of your cervix. This will be done using a small cotton swab, plastic spatula, or brush. This sample is often collected during a pelvic exam, when you are lying on your back on an exam table with feet in footrests (stirrups). In some cases, fluids (secretions) from the cervix or vagina may also be collected. How do I prepare for this test?  Be aware of where you are in your menstrual cycle. If you are menstruating on the day of the test, you may be asked to reschedule.  You may need to reschedule if you have a known vaginal infection on the day of the test.  Follow instructions from your health care provider about: ? Changing or stopping your regular medicines. Some medicines can cause abnormal test results, such as digitalis and tetracycline. ? Avoiding douching or taking a bath the day before or the day of the test. Tell a health care provider about:  Any allergies you have.  All medicines you are taking, including vitamins, herbs, eye drops, creams, and  over-the-counter medicines.  Any blood disorders you have.  Any surgeries you have had.  Any medical conditions you have.  Whether you are pregnant or may be pregnant. How are the results reported? Your test results will be reported as either abnormal or normal. A false-positive result can occur. A false positive is incorrect because it means that a condition is present when it is not. A false-negative result can occur. A false negative is incorrect because it means that a condition is not present when it is. What do the results mean? A normal test result means that you do not have signs of cancer of the vagina, cervix, or uterus. An abnormal result may mean that you have:  Cancer. A Pap test by itself is not enough to diagnose cancer. You will have more tests done in this case.  Precancerous changes in your vagina, cervix, or uterus.  Inflammation of the cervix.  An STD (sexually transmitted disease).  A fungal infection.  A parasite infection. Talk with your health care provider about what your results mean. Questions to ask your health care provider Ask your health care provider, or the department that is doing the test:  When will my results be ready?  How will I get my results?  What are my treatment options?  What other tests do I need?  What are my next steps? Summary  In general, women should have a Pap test every 3 years  until they reach menopause or age 72.  Your health care provider will collect a sample of cells from the surface of your cervix. This will be done using a small cotton swab, plastic spatula, or brush.  In some cases, fluids (secretions) from the cervix or vagina may also be collected. This information is not intended to replace advice given to you by your health care provider. Make sure you discuss any questions you have with your health care provider. Document Revised: 01/29/2017 Document Reviewed: 01/29/2017 Elsevier Patient Education   [place].

## 2019-09-12 LAB — CYTOLOGY - PAP
Comment: NEGATIVE
Diagnosis: NEGATIVE
High risk HPV: NEGATIVE

## 2019-09-12 NOTE — Progress Notes (Signed)
Normal lab letter mailed.

## 2019-11-04 ENCOUNTER — Ambulatory Visit: Payer: 59 | Admitting: Critical Care Medicine

## 2019-11-04 ENCOUNTER — Other Ambulatory Visit: Payer: Self-pay

## 2019-11-04 ENCOUNTER — Ambulatory Visit: Payer: 59 | Attending: Critical Care Medicine | Admitting: Critical Care Medicine

## 2019-11-04 ENCOUNTER — Encounter: Payer: Self-pay | Admitting: Critical Care Medicine

## 2019-11-04 VITALS — BP 171/103 | HR 100 | Ht 64.0 in | Wt 238.2 lb

## 2019-11-04 DIAGNOSIS — J45991 Cough variant asthma: Secondary | ICD-10-CM

## 2019-11-04 DIAGNOSIS — J301 Allergic rhinitis due to pollen: Secondary | ICD-10-CM | POA: Diagnosis not present

## 2019-11-04 DIAGNOSIS — R05 Cough: Secondary | ICD-10-CM

## 2019-11-04 DIAGNOSIS — R7303 Prediabetes: Secondary | ICD-10-CM | POA: Diagnosis not present

## 2019-11-04 DIAGNOSIS — R059 Cough, unspecified: Secondary | ICD-10-CM

## 2019-11-04 LAB — POCT GLYCOSYLATED HEMOGLOBIN (HGB A1C): HbA1c, POC (controlled diabetic range): 5.9 % (ref 0.0–7.0)

## 2019-11-04 MED ORDER — FLOVENT HFA 44 MCG/ACT IN AERO: 2.0000 | INHALATION_SPRAY | Freq: Two times a day (BID) | RESPIRATORY_TRACT | 12 refills | Status: AC

## 2019-11-04 MED ORDER — PREDNISONE 10 MG PO TABS: ORAL_TABLET | ORAL | 0 refills | Status: DC

## 2019-11-04 MED ORDER — PROAIR RESPICLICK 108 (90 BASE) MCG/ACT IN AEPB: 2.0000 | INHALATION_SPRAY | Freq: Four times a day (QID) | RESPIRATORY_TRACT | 4 refills | Status: DC | PRN

## 2019-11-04 NOTE — Progress Notes (Signed)
Subjective:    Patient ID: Judith Pollard, female    DOB: 1965-12-06, 54 y.o.   MRN: [id number]  History of Present Illness:  This is a pleasant 54 year old female here today to establish primary care.  The patient comes in today for a general medical screen.  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  She has been diagnosed previously with hypertension and is on hydrocal thiazide alone.  She also has a history of smoking but quit smoking in July 2020.  She notes she has a cough that has returned over the last 3 days the cough is dry.  She notes drainage from her nose is clear.  She does not have postnasal drip.  She wakes up with her mouth dry at night.  She has significant daytime hypersomnolence but denies headaches.  She has no change in memory.  She does not sleep well through the night.  She has to wake up to go to the bathroom frequently.  She denies any history of COPD or asthma.  She has had history of hyperglycemia but no formal diagnosis of diabetes.  She denies any wheezing.  She does have excess snoring.  Note coming into the office today her blood pressure was 149/100  08/05/2019 This is an acute telehealth work in visit for this 54 year old female with chronic cough.  She is going to be establishing with her new primary care provider at [facility] primary care Dr. Juleen China   Patient states her cough had improved somewhat with Flonase which we gave at the last visit however she is still having the cough.  The cough is occasionally productive.  See cough assessment below  11/04/2019 Patient returns today for cough variant asthma and is improved.  She states now cold air such as air conditioning will precipitate the cough but otherwise it is improved.  She has occasional wheezing.  She does have postnasal drainage Flonase and cetirizine help this.  Note the patient does have nausea when she takes her metformin daily so she is taking 500 mg every other day and her A1c today is  5.9   The patient is yet to receive a Covid vaccine  Cough This is a chronic problem. The current episode started more than 1 month ago. The problem has been waxing and waning. The cough is productive of sputum (clear). Associated symptoms include nasal congestion, postnasal drip, rhinorrhea and wheezing. Pertinent negatives include no chest pain, ear congestion, ear pain, fever, headaches, heartburn, hemoptysis, sore throat or shortness of breath. Associated symptoms comments: Drainage from nose. The symptoms are aggravated by fumes, dust, pollens, cold air and lying down (cough is worse at night). Risk factors for lung disease include smoking/tobacco exposure. She has tried steroid inhaler for the symptoms. The treatment provided moderate relief.   Past Medical History:  Diagnosis Date  . Hypertension      Family History  Family history unknown: Yes     Social History   Socioeconomic History  . Marital status: Married    Spouse name: Not on file  . Number of children: Not on file  . Years of education: Not on file  . Highest education level: Not on file  Occupational History  . Not on file  Tobacco Use  . Smoking status: Former Smoker    Quit date: 12/16/2018    Years since quitting: 0.8  . Smokeless tobacco: Never Used  Substance and Sexual Activity  . Alcohol use: Yes    Comment:  social  . Drug use: Not on file  . Sexual activity: Not on file  Other Topics Concern  . Not on file  Social History Narrative  . Not on file   Social Determinants of Health   Financial Resource Strain:   . Difficulty of Paying Living Expenses:   Food Insecurity:   . Worried About Programme researcher, broadcasting/film/video in the Last Year:   . Barista in the Last Year:   Transportation Needs:   . Freight forwarder (Medical):   Marland Kitchen Lack of Transportation (Non-Medical):   Physical Activity:   . Days of Exercise per Week:   . Minutes of Exercise per Session:   Stress:   . Feeling of Stress :    Social Connections:   . Frequency of Communication with Friends and Family:   . Frequency of Social Gatherings with Friends and Family:   . Attends Religious Services:   . Active Member of Clubs or Organizations:   . Attends Banker Meetings:   Marland Kitchen Marital Status:   Intimate Partner Violence:   . Fear of Current or Ex-Partner:   . Emotionally Abused:   Marland Kitchen Physically Abused:   . Sexually Abused:      No Known Allergies   Outpatient Medications Prior to Visit  Medication Sig Dispense Refill  . cetirizine (ZYRTEC) 10 MG tablet Take 1 tablet (10 mg total) by mouth daily. 30 tablet 11  . fluticasone (FLONASE) 50 MCG/ACT nasal spray Place 2 sprays into both nostrils daily. 16 g 6  . losartan-hydrochlorothiazide (HYZAAR) 100-12.5 MG tablet Take 1 tablet by mouth daily. 30 tablet 3  . metFORMIN (GLUCOPHAGE) 500 MG tablet Take 1 tablet (500 mg total) by mouth daily with breakfast. (Patient taking differently: Take 500 mg by mouth every other day. ) 90 tablet 0   No facility-administered medications prior to visit.      Review of Systems  Constitutional: Positive for activity change, fatigue and unexpected weight change. Negative for fever.  HENT: Positive for congestion, postnasal drip, rhinorrhea and sneezing. Negative for ear pain, sinus pressure, sinus pain, sore throat, tinnitus, trouble swallowing and voice change.   Eyes: Negative.   Respiratory: Positive for apnea, cough and wheezing. Negative for hemoptysis, shortness of breath and stridor.   Cardiovascular: Negative for chest pain, palpitations and leg swelling.  Gastrointestinal: Negative.  Negative for heartburn.  Genitourinary: Negative.   Musculoskeletal: Negative.   Skin: Negative.   Neurological: Negative for headaches.  Hematological: Negative.   Psychiatric/Behavioral: Negative.        Objective:   Physical Exam Vitals:   11/04/19 0830  BP: (!) 171/103  Pulse: 100  SpO2: 99%  Weight: 238 lb 3.2  oz (108 kg)  Height: 5\' 4"  (1.626 m)    Gen: Pleasant, well-nourished, in no distress,  normal affect  ENT: Bilateral nasal turbinate edema however no purulence seen mouth clear,  oropharynx clear, no postnasal drip  Neck: No JVD, no TMG, no carotid bruits  Lungs: No use of accessory muscles, no dullness to percussion, clear without rales or rhonchi  Cardiovascular: RRR, heart sounds normal, no murmur or gallops, no peripheral edema  Abdomen: soft and NT, no HSM,  BS normal  Musculoskeletal: No deformities, no cyanosis or clubbing  Neuro: alert, non focal  Skin: Warm, no lesions or rashes   Assessment & Plan:  I personally reviewed all images and lab data in the [facility] system as well as any outside material available  during this office visit and agree with the  radiology impressions.   Asthma, cough variant Cough variant asthma with repeat flare  Plan is to administer prednisone 40 mg daily for 5 days and patient is to begin Flovent HFA inhaler 2 inhalations twice daily in addition to continuing Flonase and Zyrtec  Albuterol will be as needed only   Naileah was seen today for cough.  Diagnoses and all orders for this visit:  Asthma, cough variant  Prediabetes -     POCT glycosylated hemoglobin (Hb A1C)  Non-seasonal allergic rhinitis due to pollen  Cough  Other orders -     Albuterol Sulfate (PROAIR RESPICLICK) 108 (90 Base) MCG/ACT AEPB; Inhale 2 puffs into the lungs every 6 (six) hours as needed. -     fluticasone (FLOVENT HFA) 44 MCG/ACT inhaler; Inhale 2 puffs into the lungs in the morning and at bedtime. -     predniSONE (DELTASONE) 10 MG tablet; Take 4 tablets daily for 5 days then stop  Patient was encouraged to consider getting a Covid vaccine

## 2019-11-04 NOTE — Assessment & Plan Note (Signed)
Cough variant asthma with repeat flare  Plan is to administer prednisone 40 mg daily for 5 days and patient is to begin Flovent HFA inhaler 2 inhalations twice daily in addition to continuing Flonase and Zyrtec  Albuterol will be as needed only

## 2019-11-04 NOTE — Patient Instructions (Addendum)
Take prednisone 10mg  Take 4 tablets daily for 5 days then stop Start Flovent inhaler two puff twice a day  Refill on albuterol sent to pharmacy Stay on flonase and cetirizine daily Ok to take metformin every other day. Return Dr 3 months

## 2019-12-10 ENCOUNTER — Telehealth: Payer: 59 | Admitting: Internal Medicine

## 2020-01-12 ENCOUNTER — Other Ambulatory Visit: Payer: Self-pay | Admitting: Critical Care Medicine

## 2020-01-12 MED ORDER — LOSARTAN POTASSIUM-HCTZ 100-12.5 MG PO TABS: 1.0000 | ORAL_TABLET | Freq: Every day | ORAL | 0 refills | Status: DC

## 2020-01-12 NOTE — Telephone Encounter (Signed)
Medication Refill - Medication: losartan   Has the patient contacted their pharmacy? Yes.   Pt states that she still sees Dr. Delford Field. Please advise.  (Agent: If no, request that the patient contact the pharmacy for the refill.) (Agent: If yes, when and what did the pharmacy advise?)  Preferred Pharmacy (with phone number or street name):  [id number] - [place], [place] - [address]  [address] [place] [address]  Phone: [phone] Fax: [phone]  Hours: Not open 24 hours     Agent: Please be advised that RX refills may take up to 3 business days. We ask that you follow-up with your pharmacy.

## 2020-02-06 ENCOUNTER — Other Ambulatory Visit: Payer: Self-pay | Admitting: Critical Care Medicine

## 2020-02-10 ENCOUNTER — Ambulatory Visit: Payer: 59 | Admitting: Critical Care Medicine

## 2020-02-24 ENCOUNTER — Encounter: Payer: Self-pay | Admitting: Critical Care Medicine

## 2020-02-24 ENCOUNTER — Telehealth (INDEPENDENT_AMBULATORY_CARE_PROVIDER_SITE_OTHER): Payer: 59 | Admitting: Critical Care Medicine

## 2020-02-24 DIAGNOSIS — I10 Essential (primary) hypertension: Secondary | ICD-10-CM

## 2020-02-24 DIAGNOSIS — J45991 Cough variant asthma: Secondary | ICD-10-CM

## 2020-02-24 DIAGNOSIS — R05 Cough: Secondary | ICD-10-CM

## 2020-02-24 DIAGNOSIS — R739 Hyperglycemia, unspecified: Secondary | ICD-10-CM

## 2020-02-24 DIAGNOSIS — R0981 Nasal congestion: Secondary | ICD-10-CM

## 2020-02-24 DIAGNOSIS — R0982 Postnasal drip: Secondary | ICD-10-CM | POA: Diagnosis not present

## 2020-02-24 DIAGNOSIS — G4710 Hypersomnia, unspecified: Secondary | ICD-10-CM

## 2020-02-24 DIAGNOSIS — R7303 Prediabetes: Secondary | ICD-10-CM

## 2020-02-24 DIAGNOSIS — R058 Other specified cough: Secondary | ICD-10-CM

## 2020-02-24 DIAGNOSIS — G4730 Sleep apnea, unspecified: Secondary | ICD-10-CM

## 2020-02-24 MED ORDER — CETIRIZINE HCL 10 MG PO TABS: 10.0000 mg | ORAL_TABLET | Freq: Every day | ORAL | 11 refills | Status: AC

## 2020-02-24 MED ORDER — PREDNISONE 10 MG PO TABS: ORAL_TABLET | ORAL | 0 refills | Status: DC

## 2020-02-24 MED ORDER — METFORMIN HCL 500 MG PO TABS: ORAL_TABLET | ORAL | 1 refills | Status: DC

## 2020-02-24 MED ORDER — FLUTICASONE PROPIONATE 50 MCG/ACT NA SUSP
2.0000 | Freq: Every day | NASAL | 6 refills | Status: DC
Start: 2020-02-24 — End: 2020-10-12

## 2020-02-24 MED ORDER — LOSARTAN POTASSIUM-HCTZ 100-12.5 MG PO TABS: 1.0000 | ORAL_TABLET | Freq: Every day | ORAL | 0 refills | Status: DC

## 2020-02-24 NOTE — Progress Notes (Signed)
Subjective:    Patient ID: Judith Pollard, female    DOB: 1965-10-20, 54 y.o.   MRN: [id number] Virtual Visit via Telephone Note  I connected with Jearld Adjutant on 02/24/20 at  9:00 AM EDT by telephone and verified that I am speaking with the correct person using two identifiers.   Consent:  I discussed the limitations, risks, security and privacy concerns of performing an evaluation and management service by telephone and the availability of in person appointments. I also discussed with the patient that there may be a patient responsible charge related to this service. The patient expressed understanding and agreed to proceed.  Location of patient: pt at home  Location of provider: in my office  Persons participating in the televisit with the patient.    No one else on call    History of Present Illness:  06/26/19 First Pulm OV and PCP visit to establish care  This is a pleasant 54 year old female here today to establish primary care.  The patient comes in today for a general medical screen.  She works as an Theme park manager first shift.  She has been diagnosed previously with hypertension and is on hydrocal thiazide alone.  She also has a history of smoking but quit smoking in July 2020.  She notes she has a cough that has returned over the last 3 days the cough is dry.  She notes drainage from her nose is clear.  She does not have postnasal drip.  She wakes up with her mouth dry at night.  She has significant daytime hypersomnolence but denies headaches.  She has no change in memory.  She does not sleep well through the night.  She has to wake up to go to the bathroom frequently.  She denies any history of COPD or asthma.  She has had history of hyperglycemia but no formal diagnosis of diabetes.  She denies any wheezing.  She does have excess snoring.  Note coming into the office today her blood pressure was 149/100  08/05/2019 This is an acute telehealth work in visit for this  54 year old female with chronic cough.  She is going to be establishing with her new primary care provider at [facility] primary care Dr. Earlene Plater   Patient states her cough had improved somewhat with Flonase which we gave at the last visit however she is still having the cough.  The cough is occasionally productive.  See cough assessment below  11/04/2019 Patient returns today for cough variant asthma and is improved.  She states now cold air such as air conditioning will precipitate the cough but otherwise it is improved.  She has occasional wheezing.  She does have postnasal drainage Flonase and cetirizine help this.  Note the patient does have nausea when she takes her metformin daily so she is taking 500 mg every other day and her A1c today is 5.9    02/24/2020 Cough variant asthma f/u  Pt did get covid vaccine in April  Patient states she has had increased cough with frontal headaches postnasal drainage cough is productive of clear mucus increased soreness in the throat postnasal drip as well when she was on prednisone previously this did help.  She is on her Flovent inhaler but does not take it on a regular basis but only as needed.  The patient did receive her Covid vaccines in April and was [employer]  She is declining a flu shot at this time.  She does have a mammogram scheduled  yet completed  See cough assessment below  Cough This is a chronic problem. The current episode started more than 1 year ago. The problem has been gradually worsening. The problem occurs every few hours. The cough is productive of sputum (clear mucus). Associated symptoms include headaches, heartburn, postnasal drip, rhinorrhea, a sore throat and wheezing. Pertinent negatives include no chest pain, ear congestion, ear pain, fever, hemoptysis, nasal congestion or rash. The symptoms are aggravated by cold air, fumes, lying down, stress and dust (recycle plant). Risk factors for lung disease include occupational exposure  and smoking/tobacco exposure. She has tried steroid inhaler for the symptoms. Her past medical history is significant for asthma.    Past Medical History:  Diagnosis Date  . Hypertension      Family History  Family history unknown: Yes     Social History   Socioeconomic History  . Marital status: Married    Spouse name: Not on file  . Number of children: Not on file  . Years of education: Not on file  . Highest education level: Not on file  Occupational History  . Not on file  Tobacco Use  . Smoking status: Former Smoker    Quit date: 12/16/2018    Years since quitting: 1.1  . Smokeless tobacco: Never Used  Substance and Sexual Activity  . Alcohol use: Yes    Comment: social  . Drug use: Not on file  . Sexual activity: Not on file  Other Topics Concern  . Not on file  Social History Narrative  . Not on file   Social Determinants of Health   Financial Resource Strain:   . Difficulty of Paying Living Expenses: Not on file  Food Insecurity:   . Worried About Programme researcher, broadcasting/film/video in the Last Year: Not on file  . Ran Out of Food in the Last Year: Not on file  Transportation Needs:   . Lack of Transportation (Medical): Not on file  . Lack of Transportation (Non-Medical): Not on file  Physical Activity:   . Days of Exercise per Week: Not on file  . Minutes of Exercise per Session: Not on file  Stress:   . Feeling of Stress : Not on file  Social Connections:   . Frequency of Communication with Friends and Family: Not on file  . Frequency of Social Gatherings with Friends and Family: Not on file  . Attends Religious Services: Not on file  . Active Member of Clubs or Organizations: Not on file  . Attends Banker Meetings: Not on file  . Marital Status: Not on file  Intimate Partner Violence:   . Fear of Current or Ex-Partner: Not on file  . Emotionally Abused: Not on file  . Physically Abused: Not on file  . Sexually Abused: Not on file     No Known  Allergies   Outpatient Medications Prior to Visit  Medication Sig Dispense Refill  . Albuterol Sulfate (PROAIR RESPICLICK) 108 (90 Base) MCG/ACT AEPB Inhale 2 puffs into the lungs every 6 (six) hours as needed. 1 each 4  . fluticasone (FLOVENT HFA) 44 MCG/ACT inhaler Inhale 2 puffs into the lungs in the morning and at bedtime. 1 Inhaler 12  . cetirizine (ZYRTEC) 10 MG tablet Take 1 tablet (10 mg total) by mouth daily. 30 tablet 11  . fluticasone (FLONASE) 50 MCG/ACT nasal spray Place 2 sprays into both nostrils daily. 16 g 6  . losartan-hydrochlorothiazide (HYZAAR) 100-12.5 MG tablet Take 1 tablet by mouth  daily. 90 tablet 0  . metFORMIN (GLUCOPHAGE) 500 MG tablet TAKE 1 TABLET(500 MG) BY MOUTH DAILY WITH BREAKFAST 30 tablet 0   No facility-administered medications prior to visit.     Review of Systems  Constitutional: Negative.  Negative for fever.  HENT: Positive for postnasal drip, rhinorrhea and sore throat. Negative for ear pain.   Eyes: Negative.   Respiratory: Positive for cough and wheezing. Negative for hemoptysis.   Cardiovascular: Negative for chest pain.  Gastrointestinal: Positive for heartburn. Negative for blood in stool.  Endocrine: Negative.   Genitourinary: Negative.   Musculoskeletal: Negative.   Skin: Negative for rash.  Neurological: Positive for headaches.  Hematological: Negative.   Psychiatric/Behavioral: Negative.        Objective:   Physical Exam No exam,  Phone visit      Assessment & Plan:  I personally reviewed all images and lab data in the [facility] system as well as any outside material available during this office visit and agree with the  radiology impressions.   Essential hypertension Essential hypertension stable at this time refills on blood pressure medicines given  Asthma, cough variant Cough variant asthma with increased flare plan will be to pulse prednisone at this time and have instructed the patient to use the Flovent inhaler on a  regular basis  Hyperglycemia History of hyperglycemia on Metformin, will refill Metformin at this time  Hypersomnia with sleep apnea During the pandemic the patient did not have polysomnography performed and continues with some hypersomnia  Prediabetes Will refill Metformin   Cheryl was seen today for cough.  Diagnoses and all orders for this visit:  Productive cough  PND (post-nasal drip)  Sinus congestion  Essential hypertension  Asthma, cough variant  Hyperglycemia  Hypersomnia with sleep apnea  Prediabetes  Other orders -     metFORMIN (GLUCOPHAGE) 500 MG tablet; TAKE 1 TABLET(500 MG) BY MOUTH DAILY WITH BREAKFAST -     losartan-hydrochlorothiazide (HYZAAR) 100-12.5 MG tablet; Take 1 tablet by mouth daily. -     cetirizine (ZYRTEC) 10 MG tablet; Take 1 tablet (10 mg total) by mouth daily. -     fluticasone (FLONASE) 50 MCG/ACT nasal spray; Place 2 sprays into both nostrils daily. -     predniSONE (DELTASONE) 10 MG tablet; Take 4 tablets daily for 5 days then stop   Follow Up Instructions: Pred pulse sent to pharmacy F/u in Nov Encouraged to get mammogram scheduled and get a flu vaccine  Told her pfizer covid  booster for her should be in Nov/Dec time frame    I discussed the assessment and treatment plan with the patient. The patient was provided an opportunity to ask questions and all were answered. The patient agreed with the plan and demonstrated an understanding of the instructions.   The patient was advised to call back or seek an in-person evaluation if the symptoms worsen or if the condition fails to improve as anticipated.  I provided 30 minutes of non-face-to-face time during this encounter  including  median intraservice time , review of notes, labs, imaging, medications  and explaining diagnosis and management to the patient .    Shan Levans, MD

## 2020-02-24 NOTE — Assessment & Plan Note (Signed)
History of hyperglycemia on Metformin, will refill Metformin at this time

## 2020-02-24 NOTE — Assessment & Plan Note (Signed)
Will refill Metformin

## 2020-02-24 NOTE — Assessment & Plan Note (Signed)
Cough variant asthma with increased flare plan will be to pulse prednisone at this time and have instructed the patient to use the Flovent inhaler on a regular basis

## 2020-02-24 NOTE — Assessment & Plan Note (Signed)
During the pandemic the patient did not have polysomnography performed and continues with some hypersomnia

## 2020-02-24 NOTE — Assessment & Plan Note (Signed)
Essential hypertension stable at this time refills on blood pressure medicines given

## 2020-03-11 ENCOUNTER — Other Ambulatory Visit: Payer: Self-pay

## 2020-03-11 ENCOUNTER — Ambulatory Visit
Admission: EM | Admit: 2020-03-11 | Discharge: 2020-03-11 | Disposition: A | Discharge status: Home / Self Care | Payer: 59 | Attending: Emergency Medicine | Admitting: Emergency Medicine

## 2020-03-11 ENCOUNTER — Ambulatory Visit: Payer: 59 | Admitting: Critical Care Medicine

## 2020-03-11 DIAGNOSIS — H1031 Unspecified acute conjunctivitis, right eye: Secondary | ICD-10-CM

## 2020-03-11 MED ORDER — POLYMYXIN B-TRIMETHOPRIM 10000-0.1 UNIT/ML-% OP SOLN: 1.0000 [drp] | Freq: Four times a day (QID) | OPHTHALMIC | 0 refills | Status: AC

## 2020-03-11 NOTE — ED Triage Notes (Signed)
Pt states she woke this morning and her eye was red and swollen. She states she doesn't know what caused it. Pt is aox4 and ambulatory.

## 2020-03-11 NOTE — ED Provider Notes (Signed)
[name]    CSN: [id number] Arrival date & time: 03/11/20  1409      History   Chief Complaint Chief Complaint  Patient presents with  . Eye Problem    right eye redness    HPI Judith Pollard is a 54 y.o. female history of hypertension presenting to day for evaluation of right eye redness and pain.  Reports woke up today with right eye irritation.  Feels slight swelling and discomfort underneath upper lid.  Denies any changes in vision, light sensitivity.  Denies contact use.  Denies history of prior ocular problems.  Denies any injury or trauma.  Denies significant drainage.  HPI  Past Medical History:  Diagnosis Date  . Hypertension     Patient Active Problem List   Diagnosis Date Noted  . Asthma, cough variant 08/05/2019  . Prediabetes 06/27/2019  . Cough 06/26/2019  . Essential hypertension 06/26/2019  . 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/26/2019  . Non-seasonal allergic rhinitis 06/26/2019  . Morbid obesity (HCC) 06/26/2019  . Hyperglycemia 06/26/2019    History reviewed. No pertinent surgical history.  OB History   No obstetric history on file.      Home Medications    Prior to Admission medications   Medication Sig Start Date End Date Taking? Authorizing Provider  Albuterol Sulfate (PROAIR RESPICLICK) 108 (90 Base) MCG/ACT AEPB Inhale 2 puffs into the lungs every 6 (six) hours as needed. 11/04/19  Yes Storm Frisk, MD  cetirizine (ZYRTEC) 10 MG tablet Take 1 tablet (10 mg total) by mouth daily. 02/24/20  Yes Storm Frisk, MD  fluticasone (FLONASE) 50 MCG/ACT nasal spray Place 2 sprays into both nostrils daily. 02/24/20  Yes Storm Frisk, MD  fluticasone (FLOVENT HFA) 44 MCG/ACT inhaler Inhale 2 puffs into the lungs in the morning and at bedtime. 11/04/19  Yes Storm Frisk, MD  losartan-hydrochlorothiazide (HYZAAR) 100-12.5 MG tablet Take 1 tablet by mouth daily. 02/24/20  Yes Storm Frisk, MD  metFORMIN (GLUCOPHAGE) 500 MG  tablet TAKE 1 TABLET(500 MG) BY MOUTH DAILY WITH BREAKFAST 02/24/20  Yes Storm Frisk, MD  predniSONE (DELTASONE) 10 MG tablet Take 4 tablets daily for 5 days then stop 02/24/20   Storm Frisk, MD  trimethoprim-polymyxin b (POLYTRIM) ophthalmic solution Place 1 drop into the right eye every 6 (six) hours for 7 days. 03/11/20 03/18/20  Jarmar Rousseau, Junius Creamer, PA-C    Family History Family History  Family history unknown: Yes    Social History Social History   Tobacco Use  . Smoking status: Former Smoker    Quit date: 12/16/2018    Years since quitting: 1.2  . Smokeless tobacco: Never Used  Vaping Use  . Vaping Use: Never used  Substance Use Topics  . Alcohol use: Yes    Comment: social  . Drug use: Not on file     Allergies   Patient has no known allergies.   Review of Systems Review of Systems  Constitutional: Negative for activity change, appetite change, chills, fatigue and fever.  HENT: Negative for congestion, ear pain, rhinorrhea, sinus pressure, sore throat and trouble swallowing.   Eyes: Positive for pain and redness. Negative for discharge.  Respiratory: Negative for cough, chest tightness and shortness of breath.   Cardiovascular: Negative for chest pain.  Gastrointestinal: Negative for abdominal pain, diarrhea, nausea and vomiting.  Musculoskeletal: Negative for myalgias.  Skin: Negative for rash.  Neurological: Negative for dizziness, light-headedness and headaches.  Physical Exam Triage Vital Signs ED Triage Vitals  Enc Vitals Group     BP      Pulse      Resp      Temp      Temp src      SpO2      Weight      Height      Head Circumference      Peak Flow      Pain Score      Pain Loc      Pain Edu?      Excl. in GC?    No data found.  Updated Vital Signs BP 118/84 (BP Location: Left Arm)   Pulse 88   Temp 98 F (36.7 C) (Oral)   Resp 18   LMP 05/19/2013   SpO2 96%   Visual Acuity Right Eye Distance: 20/60 Left Eye  Distance: 20/20 Bilateral Distance: 20/20  Right Eye Near: R Near: 20/20 Left Eye Near:  L Near: 20/20 Bilateral Near:  20/20  Physical Exam Vitals and nursing note reviewed.  Constitutional:      Appearance: She is well-developed.     Comments: No acute distress  HENT:     Head: Normocephalic and atraumatic.     Nose: Nose normal.  Eyes:     Extraocular Movements: Extraocular movements intact.     Pupils: Pupils are equal, round, and reactive to light.     Comments: Right eye with mild conjunctival erythema most notable to lateral portion of the eye, limbus clear, anterior chamber clear, no photophobia with exam, no uptake noted on fluorescein staining, no areas of swelling to upper or lower lids  Cardiovascular:     Rate and Rhythm: Normal rate.  Pulmonary:     Effort: Pulmonary effort is normal. No respiratory distress.  Abdominal:     General: There is no distension.  Musculoskeletal:        General: Normal range of motion.     Cervical back: Neck supple.  Skin:    General: Skin is warm and dry.  Neurological:     Mental Status: She is alert and oriented to person, place, and time.      UC Treatments / Results  Labs (all labs ordered are listed, but only abnormal results are displayed) Labs Reviewed - No data to display  EKG   Radiology No results found.  Procedures Procedures (including critical care time)  Medications Ordered in UC Medications - No data to display  Initial Impression / Assessment and Plan / UC Course  I have reviewed the triage vital signs and the nursing notes.  Pertinent labs & imaging results that were available during my care of the patient were reviewed by me and considered in my medical decision making (see chart for details).     Treating for conjunctivitis-bacterial versus viral, opting to place on antibiotics and cover bacterial given unilateral in nature.  Warm compresses and continue to monitor.  No red flags, do not  suspect ocular emergency.  Discussed strict return precautions. Patient verbalized understanding and is agreeable with plan.  Final Clinical Impressions(s) / UC Diagnoses   Final diagnoses:  Acute bacterial conjunctivitis of right eye     Discharge Instructions     - Have Good Hand Hygiene ; Avoid touching eye - Warm compresses - Use Polytrim eye drops (2 drops 4 times a day for 5-7 days)  - Follow up if not improving or worsening  ED Prescriptions    Medication Sig Dispense Auth. Provider   trimethoprim-polymyxin b (POLYTRIM) ophthalmic solution Place 1 drop into the right eye every 6 (six) hours for 7 days. 10 mL Leshonda Galambos, [place] C, PA-C     PDMP not reviewed this encounter.   Lew Dawes, [place] 03/11/20 1611

## 2020-03-11 NOTE — Discharge Instructions (Signed)
-   Have Good Hand Hygiene ; Avoid touching eye - Warm compresses - Use Polytrim eye drops (2 drops 4 times a day for 5-7 days)  - Follow up if not improving or worsening

## 2020-04-07 NOTE — Patient Instructions (Signed)

## 2020-04-08 ENCOUNTER — Ambulatory Visit (INDEPENDENT_AMBULATORY_CARE_PROVIDER_SITE_OTHER): Payer: 59 | Admitting: Internal Medicine

## 2020-04-08 ENCOUNTER — Other Ambulatory Visit: Payer: Self-pay

## 2020-04-08 ENCOUNTER — Encounter: Payer: Self-pay | Admitting: Internal Medicine

## 2020-04-08 VITALS — BP 121/71 | HR 88 | Temp 97.2°F | Resp 17 | Ht 64.0 in | Wt 238.0 lb

## 2020-04-08 DIAGNOSIS — R7303 Prediabetes: Secondary | ICD-10-CM

## 2020-04-08 DIAGNOSIS — Z0000 Encounter for general adult medical examination without abnormal findings: Secondary | ICD-10-CM

## 2020-04-08 DIAGNOSIS — Z130 Encounter for screening for diseases of the blood and blood-forming organs and certain disorders involving the immune mechanism: Secondary | ICD-10-CM | POA: Diagnosis not present

## 2020-04-08 DIAGNOSIS — Z13228 Encounter for screening for other metabolic disorders: Secondary | ICD-10-CM

## 2020-04-08 DIAGNOSIS — Z1159 Encounter for screening for other viral diseases: Secondary | ICD-10-CM

## 2020-04-08 MED ORDER — METFORMIN HCL ER 500 MG PO TB24: 500.0000 mg | ORAL_TABLET | Freq: Every day | ORAL | 1 refills | Status: DC

## 2020-04-08 NOTE — Progress Notes (Signed)
Subjective:    Judith Pollard - 54 y.o. female MRN [id number]  Date of birth: 1965-06-25  HPI  Judith Pollard is here for annual exam. 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 Feels like she is active at her job but doesn't have much physical activity outside of work. Originally from [place]. Does have concerns about GI side effects from her Metformin. Typically taking every other day.      Health Maintenance:  Health Maintenance Due  Topic Date Due  . Hepatitis C Screening  Never done  . MAMMOGRAM  08/31/2014  . INFLUENZA VACCINE  Never done    -  reports that she quit smoking about 15 months ago. She has never used smokeless tobacco. - Review of Systems: Per HPI. - Past Medical History: Patient Active Problem List   Diagnosis Date Noted  . Asthma, cough variant 08/05/2019  . Prediabetes 06/27/2019  . Cough 06/26/2019  . Essential hypertension 06/26/2019  . 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/26/2019  . Non-seasonal allergic rhinitis 06/26/2019  . Morbid obesity (HCC) 06/26/2019  . Hyperglycemia 06/26/2019   - Medications: reviewed and updated   Objective:   Physical Exam BP 121/71   Pulse 88   Temp (!) 97.2 F (36.2 C) (Temporal)   Resp 17   Ht 5\' 4"  (1.626 m)   Wt 238 lb (108 kg)   LMP 05/19/2013   SpO2 97%   BMI 40.85 kg/m  Physical Exam Constitutional:      Appearance: She is not diaphoretic.  HENT:     Head: Normocephalic and atraumatic.  Eyes:     Conjunctiva/sclera: Conjunctivae normal.     Pupils: Pupils are equal, round, and reactive to light.  Neck:     Thyroid: No thyromegaly.  Cardiovascular:     Rate and Rhythm: Normal rate and regular rhythm.     Heart sounds: Normal heart sounds. No murmur heard.   Pulmonary:     Effort: Pulmonary effort is normal. No respiratory distress.     Breath sounds: Normal breath sounds. No wheezing.  Abdominal:     General: Bowel sounds are normal. There is no distension.     Palpations: Abdomen  is soft.     Tenderness: There is no abdominal tenderness. There is no guarding or rebound.  Musculoskeletal:        General: No deformity. Normal range of motion.     Cervical back: Normal range of motion and neck supple.  Lymphadenopathy:     Cervical: No cervical adenopathy.  Skin:    General: Skin is warm and dry.     Findings: No rash.  Neurological:     Mental Status: She is alert and oriented to person, place, and time.     Gait: Gait is intact.  Psychiatric:        Mood and Affect: Mood and affect normal.        Judgment: Judgment normal.            Assessment & Plan:   1. Annual physical exam Counseled on 150 minutes of exercise per week, healthy eating (including decreased daily intake of saturated fats, cholesterol, added sugars, sodium), STI prevention, routine healthcare maintenance. - CBC with Differential - Comprehensive metabolic panel  2. Screening for deficiency anemia - CBC with Differential  3. Screening for metabolic disorder - Comprehensive metabolic panel  4. Need for hepatitis C screening test - HCV Ab w/Rflx to Verification  5. Prediabetes Last A1c 6.2 in  Jan 2021. Will repeat today. Switch to extended release Metformin in attempt to control GI side effects better.  - Hemoglobin A1c - metFORMIN (GLUCOPHAGE-XR) 500 MG 24 hr tablet; Take 1 tablet (500 mg total) by mouth daily with breakfast.  Dispense: 90 tablet; Refill: 1    Marcy Siren, D.O. 04/08/2020, 4:12 PM Primary Care at [facility]

## 2020-04-09 LAB — COMPREHENSIVE METABOLIC PANEL WITH GFR
ALT: 19 IU/L (ref 0–32)
AST: 13 IU/L (ref 0–40)
Albumin/Globulin Ratio: 1.6 (ref 1.2–2.2)
Albumin: 4.4 g/dL (ref 3.8–4.9)
Alkaline Phosphatase: 83 IU/L (ref 44–121)
BUN/Creatinine Ratio: 21 (ref 9–23)
BUN: 15 mg/dL (ref 6–24)
Bilirubin Total: 0.2 mg/dL (ref 0.0–1.2)
CO2: 23 mmol/L (ref 20–29)
Calcium: 11.9 mg/dL — ABNORMAL HIGH (ref 8.7–10.2)
Chloride: 105 mmol/L (ref 96–106)
Creatinine, Ser: 0.73 mg/dL (ref 0.57–1.00)
GFR calc Af Amer: 107 mL/min/1.73
GFR calc non Af Amer: 93 mL/min/1.73
Globulin, Total: 2.8 g/dL (ref 1.5–4.5)
Glucose: 108 mg/dL — ABNORMAL HIGH (ref 65–99)
Potassium: 4.2 mmol/L (ref 3.5–5.2)
Sodium: 139 mmol/L (ref 134–144)
Total Protein: 7.2 g/dL (ref 6.0–8.5)

## 2020-04-09 LAB — CBC WITH DIFFERENTIAL/PLATELET
Basophils Absolute: 0 x10E3/uL (ref 0.0–0.2)
Basos: 0 %
EOS (ABSOLUTE): 0.3 x10E3/uL (ref 0.0–0.4)
Eos: 4 %
Hematocrit: 39.6 % (ref 34.0–46.6)
Hemoglobin: 12.5 g/dL (ref 11.1–15.9)
Immature Grans (Abs): 0 x10E3/uL (ref 0.0–0.1)
Immature Granulocytes: 0 %
Lymphocytes Absolute: 2 x10E3/uL (ref 0.7–3.1)
Lymphs: 27 %
MCH: 26.8 pg (ref 26.6–33.0)
MCHC: 31.6 g/dL (ref 31.5–35.7)
MCV: 85 fL (ref 79–97)
Monocytes Absolute: 0.4 x10E3/uL (ref 0.1–0.9)
Monocytes: 6 %
Neutrophils Absolute: 4.5 x10E3/uL (ref 1.4–7.0)
Neutrophils: 63 %
Platelets: 367 x10E3/uL (ref 150–450)
RBC: 4.67 x10E6/uL (ref 3.77–5.28)
RDW: 13.9 % (ref 11.7–15.4)
WBC: 7.2 x10E3/uL (ref 3.4–10.8)

## 2020-04-09 LAB — HEMOGLOBIN A1C
Est. average glucose Bld gHb Est-mCnc: 137 mg/dL
Hgb A1c MFr Bld: 6.4 % — ABNORMAL HIGH (ref 4.8–5.6)

## 2020-04-09 LAB — HCV AB W/RFLX TO VERIFICATION: HCV Ab: <0.1 {s_co_ratio} (ref 0.0–0.9)

## 2020-04-09 LAB — HCV INTERPRETATION

## 2020-05-04 ENCOUNTER — Ambulatory Visit: Payer: 59 | Attending: Critical Care Medicine | Admitting: Critical Care Medicine

## 2020-05-04 ENCOUNTER — Encounter: Payer: Self-pay | Admitting: Critical Care Medicine

## 2020-05-04 ENCOUNTER — Other Ambulatory Visit: Payer: Self-pay

## 2020-05-04 VITALS — BP 145/89 | HR 76 | Temp 98.2°F | Wt 238.0 lb

## 2020-05-04 DIAGNOSIS — Z1231 Encounter for screening mammogram for malignant neoplasm of breast: Secondary | ICD-10-CM | POA: Diagnosis not present

## 2020-05-04 DIAGNOSIS — J45991 Cough variant asthma: Secondary | ICD-10-CM | POA: Diagnosis not present

## 2020-05-04 NOTE — Assessment & Plan Note (Signed)
Cough variant asthma stable at this time Continue Flovent inhaler  Return in 6 months

## 2020-05-04 NOTE — Progress Notes (Signed)
wants to see about sleep apnea test

## 2020-05-04 NOTE — Progress Notes (Signed)
Subjective:    Patient ID: Judith Pollard, female    DOB: Mar 29, 1966, 54 y.o.   MRN: [id number] History of Present Illness:  06/26/19 First Pulm OV and PCP visit to establish care  This is a pleasant 54 year old female here today to establish primary care.  The patient comes in today for a general medical screen.  She works as an Theme park manager first shift.  She has been diagnosed previously with hypertension and is on hydrocal thiazide alone.  She also has a history of smoking but quit smoking in July 2020.  She notes she has a cough that has returned over the last 3 days the cough is dry.  She notes drainage from her nose is clear.  She does not have postnasal drip.  She wakes up with her mouth dry at night.  She has significant daytime hypersomnolence but denies headaches.  She has no change in memory.  She does not sleep well through the night.  She has to wake up to go to the bathroom frequently.  She denies any history of COPD or asthma.  She has had history of hyperglycemia but no formal diagnosis of diabetes.  She denies any wheezing.  She does have excess snoring.  Note coming into the office today her blood pressure was 149/100  08/05/2019 This is an acute telehealth work in visit for this 54 year old female with chronic cough.  She is going to be establishing with her new primary care provider at [facility] primary care Dr. Earlene Plater   Patient states her cough had improved somewhat with Flonase which we gave at the last visit however she is still having the cough.  The cough is occasionally productive.  See cough assessment below  11/04/2019 Patient returns today for cough variant asthma and is improved.  She states now cold air such as air conditioning will precipitate the cough but otherwise it is improved.  She has occasional wheezing.  She does have postnasal drainage Flonase and cetirizine help this.  Note the patient does have nausea when she takes her metformin daily so she is  taking 500 mg every other day and her A1c today is 5.9    02/24/2020 Cough variant asthma f/u  Pt did get covid vaccine in April  Patient states she has had increased cough with frontal headaches postnasal drainage cough is productive of clear mucus increased soreness in the throat postnasal drip as well when she was on prednisone previously this did help.  She is on her Flovent inhaler but does not take it on a regular basis but only as needed.  The patient did receive her Covid vaccines in April and was [employer]  She is declining a flu shot at this time.  She does have a mammogram scheduled yet completed  See cough assessment below  05/04/2020 Is a follow-up visit for cough variant asthma the patient is doing well with her Flovent inhaler she is having no symptoms at this time see shortness of breath asthma assessment below Note the patient has had her Pfizer Covid vaccines and is due up a booster shot  Cough This is a chronic problem. The current episode started more than 1 year ago. The problem has been rapidly improving. Cough characteristics: clear mucus. Associated symptoms include heartburn and postnasal drip. Pertinent negatives include no chest pain, ear congestion, ear pain, fever, headaches, hemoptysis, nasal congestion, rash, rhinorrhea, sore throat or wheezing. The symptoms are aggravated by cold air, fumes, lying down, stress and  dust (recycle plant). Risk factors for lung disease include occupational exposure and smoking/tobacco exposure. She has tried steroid inhaler for the symptoms. Her past medical history is significant for asthma.    Past Medical History:  Diagnosis Date  . Hypertension      Family History  Family history unknown: Yes     Social History   Socioeconomic History  . Marital status: Married    Spouse name: Not on file  . Number of children: Not on file  . Years of education: Not on file  . Highest education level: Not on file  Occupational  History  . Not on file  Tobacco Use  . Smoking status: Former Smoker    Quit date: 12/16/2018    Years since quitting: 1.3  . Smokeless tobacco: Never Used  Vaping Use  . Vaping Use: Never used  Substance and Sexual Activity  . Alcohol use: Yes    Comment: social  . Drug use: Not on file  . Sexual activity: Not Currently  Other Topics Concern  . Not on file  Social History Narrative  . Not on file   Social Determinants of Health   Financial Resource Strain:   . Difficulty of Paying Living Expenses: Not on file  Food Insecurity:   . Worried About Programme researcher, broadcasting/film/video in the Last Year: Not on file  . Ran Out of Food in the Last Year: Not on file  Transportation Needs:   . Lack of Transportation (Medical): Not on file  . Lack of Transportation (Non-Medical): Not on file  Physical Activity:   . Days of Exercise per Week: Not on file  . Minutes of Exercise per Session: Not on file  Stress:   . Feeling of Stress : Not on file  Social Connections:   . Frequency of Communication with Friends and Family: Not on file  . Frequency of Social Gatherings with Friends and Family: Not on file  . Attends Religious Services: Not on file  . Active Member of Clubs or Organizations: Not on file  . Attends Banker Meetings: Not on file  . Marital Status: Not on file  Intimate Partner Violence:   . Fear of Current or Ex-Partner: Not on file  . Emotionally Abused: Not on file  . Physically Abused: Not on file  . Sexually Abused: Not on file     No Known Allergies   Outpatient Medications Prior to Visit  Medication Sig Dispense Refill  . Albuterol Sulfate (PROAIR RESPICLICK) 108 (90 Base) MCG/ACT AEPB Inhale 2 puffs into the lungs every 6 (six) hours as needed. 1 each 4  . cetirizine (ZYRTEC) 10 MG tablet Take 1 tablet (10 mg total) by mouth daily. 30 tablet 11  . fluticasone (FLONASE) 50 MCG/ACT nasal spray Place 2 sprays into both nostrils daily. 16 g 6  . fluticasone  (FLOVENT HFA) 44 MCG/ACT inhaler Inhale 2 puffs into the lungs in the morning and at bedtime. 1 Inhaler 12  . losartan-hydrochlorothiazide (HYZAAR) 100-12.5 MG tablet Take 1 tablet by mouth daily. 90 tablet 0  . metFORMIN (GLUCOPHAGE-XR) 500 MG 24 hr tablet Take 1 tablet (500 mg total) by mouth daily with breakfast. 90 tablet 1   No facility-administered medications prior to visit.     Review of Systems  Constitutional: Negative.  Negative for fever.  HENT: Positive for postnasal drip. Negative for ear pain, rhinorrhea and sore throat.   Eyes: Negative.   Respiratory: Positive for cough. Negative for hemoptysis  and wheezing.   Cardiovascular: Negative for chest pain.  Gastrointestinal: Positive for heartburn. Negative for blood in stool.  Endocrine: Negative.   Genitourinary: Negative.   Musculoskeletal: Negative.   Skin: Negative for rash.  Neurological: Negative for headaches.  Hematological: Negative.   Psychiatric/Behavioral: Negative.        Objective:   Physical Exam Vitals:   05/04/20 1546  BP: (!) 145/89  Pulse: 76  Temp: 98.2 F (36.8 C)  SpO2: 99%  Weight: 238 lb (108 kg)    Gen: Pleasant, well-nourished, in no distress,  normal affect  ENT: No lesions,  mouth clear,  oropharynx clear, no postnasal drip  Neck: No JVD, no TMG, no carotid bruits  Lungs: No use of accessory muscles, no dullness to percussion, clear without rales or rhonchi  Cardiovascular: RRR, heart sounds normal, no murmur or gallops, no peripheral edema  Abdomen: soft and NT, no HSM,  BS normal  Musculoskeletal: No deformities, no cyanosis or clubbing  Neuro: alert, non focal  Skin: Warm, no lesions or rashes  No results found.        Assessment & Plan:  I personally reviewed all images and lab data in the [facility] system as well as any outside material available during this office visit and agree with the  radiology impressions.   Asthma, cough variant Cough variant asthma stable  at this time Continue Flovent inhaler  Return in 6 months   Samaiya was seen today for asthma.  Diagnoses and all orders for this visit:  Encounter for screening mammogram for malignant neoplasm of breast -     MM DIGITAL SCREENING BILATERAL; Future  Asthma, cough variant    A mammogram screening study was ordered at this visit as well

## 2020-05-04 NOTE — Patient Instructions (Signed)
No change in your medications  A mammogram was ordered to be scheduled  Return to see Dr. Delford Field 6 months

## 2020-09-27 ENCOUNTER — Telehealth: Payer: Self-pay | Admitting: Internal Medicine

## 2020-09-27 DIAGNOSIS — I10 Essential (primary) hypertension: Secondary | ICD-10-CM

## 2020-09-27 MED ORDER — LOSARTAN POTASSIUM-HCTZ 100-12.5 MG PO TABS: 1.0000 | ORAL_TABLET | Freq: Every day | ORAL | 0 refills | Status: DC

## 2020-09-27 NOTE — Telephone Encounter (Signed)
Pt states she is out of her losartan-hydrochlorothiazide (HYZAAR) 100-12.5 MG tablet and needs it refilled ASAP.  Pharmacy  [id number] [place], [place] - [address] AT [facility] [address]  [address], [place] [address]  Phone:  [phone] Fax:  [phone]

## 2020-09-27 NOTE — Telephone Encounter (Signed)
rx sent.  Will need an OV for additional refills.  Please schedule.

## 2020-10-12 ENCOUNTER — Ambulatory Visit (INDEPENDENT_AMBULATORY_CARE_PROVIDER_SITE_OTHER): Payer: 59 | Admitting: Internal Medicine

## 2020-10-12 ENCOUNTER — Other Ambulatory Visit: Payer: Self-pay

## 2020-10-12 ENCOUNTER — Encounter: Payer: Self-pay | Admitting: Internal Medicine

## 2020-10-12 VITALS — BP 123/87 | HR 92 | Temp 97.3°F | Resp 16 | Ht 64.0 in | Wt 234.0 lb

## 2020-10-12 DIAGNOSIS — I10 Essential (primary) hypertension: Secondary | ICD-10-CM | POA: Diagnosis not present

## 2020-10-12 DIAGNOSIS — R7303 Prediabetes: Secondary | ICD-10-CM | POA: Diagnosis not present

## 2020-10-12 DIAGNOSIS — J45991 Cough variant asthma: Secondary | ICD-10-CM | POA: Diagnosis not present

## 2020-10-12 MED ORDER — LOSARTAN POTASSIUM-HCTZ 100-12.5 MG PO TABS: 1.0000 | ORAL_TABLET | Freq: Every day | ORAL | 0 refills | Status: DC

## 2020-10-12 MED ORDER — LOSARTAN POTASSIUM-HCTZ 100-12.5 MG PO TABS: 1.0000 | ORAL_TABLET | Freq: Every day | ORAL | 3 refills | Status: DC

## 2020-10-12 MED ORDER — PROAIR RESPICLICK 108 (90 BASE) MCG/ACT IN AEPB: 2.0000 | INHALATION_SPRAY | Freq: Four times a day (QID) | RESPIRATORY_TRACT | 4 refills | Status: AC | PRN

## 2020-10-12 MED ORDER — METFORMIN HCL ER 500 MG PO TB24
500.0000 mg | ORAL_TABLET | Freq: Every day | ORAL | 1 refills | Status: DC
Start: 2020-10-12 — End: 2021-06-23

## 2020-10-12 NOTE — Progress Notes (Signed)
Medication refill

## 2020-10-12 NOTE — Progress Notes (Signed)
  Subjective:    Judith Pollard - 55 y.o. female MRN [id number]  Date of birth: 1965/08/29  HPI  Judith Pollard is here for follow up. Has a history of prediabetes. Takes Metformin 500 mg daily.   Chronic HTN Disease Monitoring:  Home BP Monitoring - Occasionally, at goal  Chest pain- no  Dyspnea- no Headache - no  Medications: Losartan 100 mg, HCTZ 12.5 mg  Compliance- yes Lightheadedness- no  Edema- no   Health Maintenance:  Health Maintenance Due  Topic Date Due  . MAMMOGRAM  08/31/2014  . COVID-19 Vaccine (3 - Booster for Pfizer series) 03/27/2020    -  reports that she quit smoking about 21 months ago. She has never used smokeless tobacco. - Review of Systems: Per HPI. - Past Medical History: Patient Active Problem List   Diagnosis Date Noted  . Asthma, cough variant 08/05/2019  . Prediabetes 06/27/2019  . Essential hypertension 06/26/2019  . 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/26/2019  . Non-seasonal allergic rhinitis 06/26/2019  . Morbid obesity (HCC) 06/26/2019  . Hyperglycemia 06/26/2019   - Medications: reviewed and updated   Objective:   Physical Exam BP 123/87   Pulse 92   Ht 5\' 4"  (1.626 m)   Wt 234 lb (106.1 kg)   LMP 05/19/2013   SpO2 98%   BMI 40.17 kg/m  Physical Exam         Assessment & Plan:   1. Essential hypertension BP well controlled. Asymptomatic. Continue current regimen.   2. Prediabetes Last A1c 6.4 in Nov 2021. Monitor and continue Metformin.  - metFORMIN (GLUCOPHAGE-XR) 500 MG 24 hr tablet; Take 1 tablet (500 mg total) by mouth daily with breakfast.  Dispense: 90 tablet; Refill: 1 - Hemoglobin A1c  3. Asthma, cough variant - Albuterol Sulfate (PROAIR RESPICLICK) 108 (90 Base) MCG/ACT AEPB; Inhale 2 puffs into the lungs every 6 (six) hours as needed.  Dispense: 1 each; Refill: 08 May 2020, D.O. 10/12/2020, 4:17 PM Primary Care at [facility]

## 2020-10-13 LAB — HEMOGLOBIN A1C
Est. average glucose Bld gHb Est-mCnc: 131 mg/dL
Hgb A1c MFr Bld: 6.2 % — ABNORMAL HIGH (ref 4.8–5.6)

## 2020-10-13 NOTE — Telephone Encounter (Signed)
Sent to nurse

## 2020-10-15 ENCOUNTER — Other Ambulatory Visit: Payer: Self-pay

## 2020-10-15 ENCOUNTER — Telehealth: Payer: Self-pay | Admitting: Internal Medicine

## 2020-10-15 DIAGNOSIS — I10 Essential (primary) hypertension: Secondary | ICD-10-CM

## 2020-10-15 MED ORDER — LOSARTAN POTASSIUM-HCTZ 100-12.5 MG PO TABS: 1.0000 | ORAL_TABLET | Freq: Every day | ORAL | 3 refills | Status: DC

## 2020-10-15 NOTE — Telephone Encounter (Signed)
Pt called stating she needs a refill on losartan-hydrochlorothiazide (HYZAAR) 100-12.5 MG tablet . Pt stated she is out of her meds.   Pharmacy  [facility] #[id number] [place] Otto, [place] - [address] AT [facility] [address]  [address], [place] [address]  Phone:  [phone] Fax:  [phone]  DEA #:  [id number]

## 2020-10-19 NOTE — Telephone Encounter (Signed)
Pt is calling again needing her medication refilled. Pt stating she is out.

## 2020-10-21 NOTE — Telephone Encounter (Signed)
Late entry. Called patient pharmacy May 18 at 1706. They validated that patient has not came to pick up rx.  Rx at pharmacy.

## 2021-04-30 ENCOUNTER — Encounter (HOSPITAL_BASED_OUTPATIENT_CLINIC_OR_DEPARTMENT_OTHER): Payer: Self-pay | Admitting: Emergency Medicine

## 2021-04-30 ENCOUNTER — Emergency Department (HOSPITAL_BASED_OUTPATIENT_CLINIC_OR_DEPARTMENT_OTHER)
Admission: EM | Admit: 2021-04-30 | Discharge: 2021-04-30 | Disposition: A | Discharge status: Home / Self Care | Payer: 59 | Attending: Emergency Medicine | Admitting: Emergency Medicine

## 2021-04-30 ENCOUNTER — Other Ambulatory Visit: Payer: Self-pay

## 2021-04-30 ENCOUNTER — Emergency Department (HOSPITAL_BASED_OUTPATIENT_CLINIC_OR_DEPARTMENT_OTHER): Payer: 59 | Admitting: Radiology

## 2021-04-30 DIAGNOSIS — Z7951 Long term (current) use of inhaled steroids: Secondary | ICD-10-CM | POA: Diagnosis not present

## 2021-04-30 DIAGNOSIS — Z87891 Personal history of nicotine dependence: Secondary | ICD-10-CM | POA: Diagnosis not present

## 2021-04-30 DIAGNOSIS — Z7984 Long term (current) use of oral hypoglycemic drugs: Secondary | ICD-10-CM | POA: Insufficient documentation

## 2021-04-30 DIAGNOSIS — I10 Essential (primary) hypertension: Secondary | ICD-10-CM | POA: Insufficient documentation

## 2021-04-30 DIAGNOSIS — M25512 Pain in left shoulder: Secondary | ICD-10-CM

## 2021-04-30 DIAGNOSIS — J45909 Unspecified asthma, uncomplicated: Secondary | ICD-10-CM | POA: Diagnosis not present

## 2021-04-30 DIAGNOSIS — Z79899 Other long term (current) drug therapy: Secondary | ICD-10-CM | POA: Diagnosis not present

## 2021-04-30 DIAGNOSIS — E119 Type 2 diabetes mellitus without complications: Secondary | ICD-10-CM | POA: Insufficient documentation

## 2021-04-30 DIAGNOSIS — W109XXA Fall (on) (from) unspecified stairs and steps, initial encounter: Secondary | ICD-10-CM | POA: Diagnosis not present

## 2021-04-30 DIAGNOSIS — W19XXXA Unspecified fall, initial encounter: Secondary | ICD-10-CM

## 2021-04-30 DIAGNOSIS — M25532 Pain in left wrist: Secondary | ICD-10-CM

## 2021-04-30 HISTORY — DX: Type 2 diabetes mellitus without complications: E11.9

## 2021-04-30 MED ORDER — HYDROCODONE-ACETAMINOPHEN 5-325 MG PO TABS: 1.0000 | ORAL_TABLET | Freq: Four times a day (QID) | ORAL | 0 refills | Status: DC | PRN

## 2021-04-30 NOTE — ED Provider Notes (Signed)
[facility] EMERGENCY DEPT Provider Note   CSN: [id number] Arrival date & time: 04/30/21  2101     History Chief Complaint  Patient presents with   Marletta Lor    Judith Pollard is a 55 y.o. female.  She is here for evaluation of left shoulder and wrist pain after a fall down 6 steps yesterday.  No loss of consciousness.  No numbness or weakness.  Increased pain with any type of movement.  Not on blood thinners.  She is right-hand dominant and works driving a Chief Executive Officer.  The history is provided by the patient.  Fall This is a new problem. The current episode started yesterday. The problem has not changed since onset.Pertinent negatives include no chest pain, no abdominal pain, no headaches and no shortness of breath. The symptoms are aggravated by bending and twisting. Nothing relieves the symptoms. She has tried rest for the symptoms. The treatment provided no relief.      Past Medical History:  Diagnosis Date   Diabetes mellitus without complication (HCC)    Hypertension     Patient Active Problem List   Diagnosis Date Noted   Asthma, cough variant 08/05/2019   Prediabetes 06/27/2019   Essential hypertension 06/26/2019   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/26/2019   Non-seasonal allergic rhinitis 06/26/2019   Morbid obesity (HCC) 06/26/2019   Hyperglycemia 06/26/2019    History reviewed. No pertinent surgical history.   OB History   No obstetric history on file.     Family History  Family history unknown: Yes    Social History   Tobacco Use   Smoking status: Former    Types: Cigarettes    Quit date: 12/16/2018    Years since quitting: 2.3   Smokeless tobacco: Never  Vaping Use   Vaping Use: Never used  Substance Use Topics   Alcohol use: Yes    Comment: social   Drug use: Never    Home Medications Prior to Admission medications   Medication Sig Start Date End Date Taking? Authorizing Provider  Albuterol Sulfate (PROAIR RESPICLICK) 108 (90 Base)  MCG/ACT AEPB Inhale 2 puffs into the lungs every 6 (six) hours as needed. 10/12/20   Arvilla Market, MD  cetirizine (ZYRTEC) 10 MG tablet Take 1 tablet (10 mg total) by mouth daily. 02/24/20   Storm Frisk, MD  fluticasone (FLOVENT HFA) 44 MCG/ACT inhaler Inhale 2 puffs into the lungs in the morning and at bedtime. 11/04/19   Storm Frisk, MD  losartan-hydrochlorothiazide (HYZAAR) 100-12.5 MG tablet Take 1 tablet by mouth daily. 10/15/20   Arvilla Market, MD  metFORMIN (GLUCOPHAGE-XR) 500 MG 24 hr tablet Take 1 tablet (500 mg total) by mouth daily with breakfast. 10/12/20   Arvilla Market, MD    Allergies    Patient has no known allergies.  Review of Systems   Review of Systems  Constitutional:  Negative for fever.  Respiratory:  Negative for shortness of breath.   Cardiovascular:  Negative for chest pain.  Gastrointestinal:  Negative for abdominal pain.  Musculoskeletal:  Negative for back pain.  Neurological:  Negative for weakness, numbness and headaches.   Physical Exam Updated Vital Signs BP (!) (P) 144/89 (BP Location: Right Arm)   Pulse (P) 89   Temp (P) 98.4 F (36.9 C)   Resp (P) 20   Ht (P) 5\' 4"  (1.626 m)   Wt (P) 95.3 kg   LMP 05/19/2013   SpO2 (P) 97%   BMI (P) 36.05 kg/m  Physical Exam Constitutional:      Appearance: Normal appearance. She is well-developed.  HENT:     Head: Normocephalic and atraumatic.  Eyes:     Conjunctiva/sclera: Conjunctivae normal.  Musculoskeletal:        General: Tenderness and signs of injury present. No deformity.     Cervical back: Neck supple.     Comments: She is diffusely tender about her left shoulder.  She has pain with internal and external rotation along with abduction.  Elbow nontender.  Wrist is mostly tender distally on the palmar aspect.  No snuffbox tenderness.  Distal neurovascular intact.  Cap refil and radial pulse 2+.  Other extremities without any pain or limitations.   Skin:    General: Skin is warm and dry.  Neurological:     General: No focal deficit present.     Mental Status: She is alert.     GCS: GCS eye subscore is 4. GCS verbal subscore is 5. GCS motor subscore is 6.     Sensory: No sensory deficit.     Motor: No weakness.    ED Results / Procedures / Treatments   Labs (all labs ordered are listed, but only abnormal results are displayed) Labs Reviewed - No data to display  EKG None  Radiology DG Wrist Complete Left  Result Date: 04/30/2021 CLINICAL DATA:  Initial evaluation for acute trauma, fall. EXAM: LEFT WRIST - COMPLETE 3+ VIEW COMPARISON:  None. FINDINGS: No acute fracture or dislocation. Normal radiocarpal and distal radioulnar articulations maintained. Osseous mineralization normal. No soft tissue abnormality. IMPRESSION: No acute osseous abnormality about the left wrist. Electronically Signed   By: Rise Mu M.D.   On: 04/30/2021 21:47   DG Shoulder Left  Result Date: 04/30/2021 CLINICAL DATA:  Initial evaluation for acute trauma, fall. EXAM: LEFT SHOULDER - 2+ VIEW COMPARISON:  None. FINDINGS: No acute fracture dislocation. Glenohumeral and acromioclavicular articulations intact. 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 No soft tissue abnormality. Visualized left hemithorax is clear. IMPRESSION: No acute osseous abnormality about the left shoulder. Electronically Signed   By: Rise Mu M.D.   On: 04/30/2021 21:45    Procedures Procedures   Medications Ordered in ED Medications - No data to display  ED Course  I have reviewed the triage vital signs and the nursing notes.  Pertinent labs & imaging results that were available during my care of the patient were reviewed by me and considered in my medical decision making (see chart for details).    MDM Rules/Calculators/A&P                          Differential diagnosis includes fracture, contusion, dislocation, ligamentous injury.   Wrist injury seems to be most likely contusion as she is tender on the proximal thenar eminence.  X-rays do not show any evidence of fracture.  Shoulder is diffusely tender and limited in range of motion.  X-rays do not show any fracture or dislocation.  Possible rotator cuff injury.  Will place in sling.  Recommended ice pain medication and orthopedic follow-up.  Contact information given.  Return instructions discussed.  Note for work.  Final Clinical Impression(s) / ED Diagnoses Final diagnoses:  Acute pain of left shoulder  Left wrist pain  Fall, initial encounter    Rx / DC Orders ED Discharge Orders          Ordered    HYDROcodone-acetaminophen (NORCO/VICODIN) 5-325 MG tablet  Every 6  hours PRN        04/30/21 2242             Terrilee Files, MD 05/01/21 1021

## 2021-04-30 NOTE — Discharge Instructions (Addendum)
You were seen in the emergency department for evaluation of left shoulder and left wrist pain after a fall.  You had x-rays that did not show any obvious fracture or dislocation.  Sling for comfort.  Ice to the affected areas.  We are prescribing you some pain medication which may help your symptoms.  Please contact orthopedics for follow-up.

## 2021-04-30 NOTE — ED Triage Notes (Signed)
Pt arrives pov, ambulatory to triage with report of fall on Friday. Reports sliding down stairs, injuring left shoulder. Tenderness note, decreased ROM. Denies loc, denies thinners

## 2021-05-03 ENCOUNTER — Other Ambulatory Visit (HOSPITAL_BASED_OUTPATIENT_CLINIC_OR_DEPARTMENT_OTHER): Payer: Self-pay

## 2021-05-03 ENCOUNTER — Ambulatory Visit (HOSPITAL_BASED_OUTPATIENT_CLINIC_OR_DEPARTMENT_OTHER): Payer: 59 | Admitting: Orthopaedic Surgery

## 2021-05-03 ENCOUNTER — Other Ambulatory Visit: Payer: Self-pay

## 2021-05-03 DIAGNOSIS — S40012A Contusion of left shoulder, initial encounter: Secondary | ICD-10-CM

## 2021-05-03 MED ORDER — METHYLPREDNISOLONE 4 MG PO TBPK
ORAL_TABLET | ORAL | 0 refills | Status: DC
  Filled 2021-05-03: qty 21, 6d supply, fill #0

## 2021-05-03 NOTE — Patient Instructions (Signed)
Disability and Out-of-Work Paperwork  If you would like to file disability (short term and/or and long term), FMLA, or other out-of-work paperwork, please mail, hand deliver, or fax the paperwork to our main office:   [facility]  [address], [place] [address] Phone: [phone] Fax: [phone]  We are unable to complete these forms in our [facility] at [facility] satellite office and want to ensure your paperwork is filed in an appropriate and proper manner.   Thank you for understanding.   Phone number for [employer], the company we use or disability paperwork, is [phone].

## 2021-05-03 NOTE — Progress Notes (Signed)
Chief Complaint: left shoulder pain, left wrist pain     History of Present Illness:   Judith Pollard is a 55 y.o. female with left shoulder and left wrist pain after a fall the week prior.  This occurred [place].  She fell down several steps.  Since that time she has had significant pain and weakness in the shoulder.  She initially presented to the emergency room was placed in a sling.  She has limited range of motion of the shoulder.  She states that the left palm pain is improving.  She works as a Estate agent.  She is right-hand dominant.  She has taken NSAIDs as well as Vicodin which helps somewhat.  She is remained in a sling with limited motion    Surgical History:   None  PMH/PSH/Family History/Social History/Meds/Allergies:    Past Medical History:  Diagnosis Date   Diabetes mellitus without complication (HCC)    Hypertension    No past surgical history on file. Social History   Socioeconomic History   Marital status: Married    Spouse name: Not on file   Number of children: Not on file   Years of education: Not on file   Highest education level: Not on file  Occupational History   Not on file  Tobacco Use   Smoking status: Former    Types: Cigarettes    Quit date: 12/16/2018    Years since quitting: 2.3   Smokeless tobacco: Never  Vaping Use   Vaping Use: Never used  Substance and Sexual Activity   Alcohol use: Yes    Comment: social   Drug use: Never   Sexual activity: Not Currently  Other Topics Concern   Not on file  Social History Narrative   Not on file   Social Determinants of Health   Financial Resource Strain: Not on file  Food Insecurity: Not on file  Transportation Needs: Not on file  Physical Activity: Not on file  Stress: Not on file  Social Connections: Not on file   Family History  Family history unknown: Yes   No Known Allergies Current  Outpatient Medications  Medication Sig Dispense Refill   methylPREDNISolone (MEDROL DOSEPAK) 4 MG TBPK tablet Take per packet instructions 1 each 0   Albuterol Sulfate (PROAIR RESPICLICK) 108 (90 Base) MCG/ACT AEPB Inhale 2 puffs into the lungs every 6 (six) hours as needed. 1 each 4   cetirizine (ZYRTEC) 10 MG tablet Take 1 tablet (10 mg total) by mouth daily. 30 tablet 11   fluticasone (FLOVENT HFA) 44 MCG/ACT inhaler Inhale 2 puffs into the lungs in the morning and at bedtime. 1 Inhaler 12   HYDROcodone-acetaminophen (NORCO/VICODIN) 5-325 MG tablet Take 1 tablet by mouth every 6 (six) hours as needed. 10 tablet 0   losartan-hydrochlorothiazide (HYZAAR) 100-12.5 MG tablet Take 1 tablet by mouth daily. 30 tablet 3   metFORMIN (GLUCOPHAGE-XR) 500 MG 24 hr tablet Take 1 tablet (500 mg total) by mouth daily with breakfast. 90 tablet 1   No current facility-administered medications for this visit.   No results found.  Review of Systems:   A ROS was performed including pertinent positives and negatives as documented in the HPI.  Physical Exam :   Constitutional: NAD and appears  stated age Neurological: Alert and oriented Psych: Appropriate affect and cooperative Last menstrual period 05/19/2013.   Comprehensive Musculoskeletal Exam:    Musculoskeletal Exam    Inspection Right Left  Skin No atrophy or winging No atrophy or winging  Palpation    Tenderness None Lateral deltoid  Range of Motion    Flexion (passive) 170 40  Flexion (active) 170 20  Abduction 170 20  ER at the side 70 15  Can reach behind back to T12 Front pocket  Strength     Normal 4/5 supra and infraspinatus with pain  Special Tests    Pseudoparalytic No No  Neurologic    Fires PIN, radial, median, ulnar, musculocutaneous, axillary, suprascapular, long thoracic, and spinal accessory innervated muscles. No abnormal sensibility  Vascular/Lymphatic    Radial Pulse 2+ 2+  Cervical Exam    Patient has symmetric  cervical range of motion with negative Spurling's test.  Special Test:      Imaging:   Xray (3 views left shoulder, 3 views left wrist): There appears to be inferior Bankart bony loss with regard to the left shoulder otherwise normal  Left wrist is normal    I personally reviewed and interpreted the radiographs.   Assessment:   55 year old female with left shoulder pain after a fall directly on the side.  On the x-rays I do see some inferior bone loss which may be consistent with an inferior direct contusion or crush on the bone.  She does not endorse any specific dislocation event when this happened.  Given the fact that she has a very significantly limited abduction of the shoulder I would like to obtain an MRI to rule out any type of acute rotator cuff injury.  Plan :    -Plan for left shoulder MRI -PT for left shoulder for passive and active range of motion as tolerated -Medrol Dosepak sent to pharmacy to help with pain  I believe that advance imaging in the form of an MRI is indicated for the following reasons: -Xrays images were obtained and not diagnostic -The patient has failed treatment modalities including rest, activity restriction, sling usage, NSAIDs, narcotic -The following worrisome symptoms are present on history and exam: Acute traumatic injury followed by loss of motion     I personally saw and evaluated the patient, and participated in the management and treatment plan.  Huel Cote, MD Attending Physician, Orthopedic Surgery  This document was dictated using Dragon voice recognition software. A reasonable attempt at proof reading has been made to minimize errors.

## 2021-05-03 NOTE — Addendum Note (Signed)
Addended by: Benancio Deeds on: 05/03/2021 02:13 PM   Modules accepted: Orders

## 2021-05-10 ENCOUNTER — Telehealth: Payer: Self-pay | Admitting: Orthopaedic Surgery

## 2021-05-10 NOTE — Telephone Encounter (Signed)
Pt calling with questions about her P.T referral that was placed for her at her previous appt. Pt is sch'd for an mri follow up on 05/31/21 but is currently waiting for a call back from them to get sch'd. The best call back number for the pt is [phone] .

## 2021-05-10 NOTE — Telephone Encounter (Signed)
Spoke to pt and clarified any questions and gave her number for physical therapy to  call for further PT related questions

## 2021-05-22 NOTE — Therapy (Signed)
OUTPATIENT PHYSICAL THERAPY SHOULDER EVALUATION   Patient Name: Judith Pollard MRN: [id number] DOB:01/09/1966, 55 y.o., female Today's Date: 05/23/2021   PT End of Session - 05/23/21 1458     Visit Number 1    Number of Visits 12    PT Start Time 1445    PT Stop Time 1540    PT Time Calculation (min) 55 min    Activity Tolerance --   Pain and limitations with exercises and ROM   Behavior During Therapy WFL for tasks assessed/performed             Past Medical History:  Diagnosis Date   Diabetes mellitus without complication (HCC)    Hypertension    History reviewed. No pertinent surgical history. Patient Active Problem List   Diagnosis Date Noted   Asthma, cough variant 08/05/2019   Prediabetes 06/27/2019   Essential hypertension 06/26/2019   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/26/2019   Non-seasonal allergic rhinitis 06/26/2019   Morbid obesity (HCC) 06/26/2019   Hyperglycemia 06/26/2019    PCP: Arvilla Market, MD  REFERRING PROVIDER: Huel Cote, MD  REFERRING DIAG: [phone] (ICD-10-CM) - Contusion of left shoulder, initial encounter   THERAPY DIAG:  Stiffness of left shoulder, not elsewhere classified  Left shoulder pain, unspecified chronicity  Muscle weakness (generalized)   ONSET DATE: 04/29/2021  SUBJECTIVE:                                                                                                                                                                                      SUBJECTIVE STATEMENT: Pt was visiting family for Thanksgiving and fell down several steps.  Pt turned on the steps and fell down face forward down 8 steps.  Pt went to the ER, had x rays, and was placed in a sling.  Pt saw ortho MD on 05/03/2021 and MD ordered PT for L shoulder PROM and AROM as tolerated and ordered a medrol dosepak per MD note.  MD did order a MRI to rule out any type of acute rotator cuff injury.  Pt states her shoulder was really high and  the dosepak has helped bring the swelling down.      Pt's worst pain is when lying down and she has difficulty sleeping.  Pt states she is improving.  Pt is very limited with movement though states her movement is improving.  Pt has increased pain with L shoulder mobility and is significantly limited with reaching.  Pt is limited with lifting objects.  Pt is unable to perform overhead act's with L UE.  Pt has pain and limitations with dressing and bathing.  Pt is not driving.  Pt states she doesn't use the sling as much as home, but uses it when leaving home.  Pt states she does have L shoulder pain when arm is handing down out of sling for awhile.       PERTINENT HISTORY: Pt currently using sling.  prediabetes  PAIN:  Are you having pain? Yes NRPS scale: 6/10 Current, 8/10 worst, 6/10 best Pain location: L superior and posterior shoulder Aggravating factors: lying down, certain movements Relieving factors: ice > heat, dosepak  PRECAUTIONS: per dx  WEIGHT BEARING RESTRICTIONS  Pt is in sling.  Pt states she was not given an WB'ing restrictions and none seen in chart.   FALLS:  Has patient fallen in last 6 months? 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  She is not performing her work activities due to injury.   PLOF: Independent.  Pt was able to perform all of her ADLs/IADLs and normal reaching and overhead activities without shoulder pain or difficulty.  Pt was able to perform all of her normal occupational activities without limitation.    PATIENT GOALS using her L like normal.  Return to PLOF  OBJECTIVE:   DIAGNOSTIC FINDINGS:  MD note indicated there appeared to be inferior Bankart bony loss with regard to the left shoulder otherwise normal and L wrist was normal.  X rays in Epic indicated No acute osseous abnormality about the left shoulder. 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    PATIENT SURVEYS:  FOTO 51  COGNITION:  Overall cognitive status: Within functional limits for tasks assessed  Pt is right-hand dominant.  OBSERVATION:  Pt wearing sling.  Swelling in superior shoulder and UT     SENSATION:  Light touch: 2+ in bilat cervical shoulder and prox UE     PALPATION: TTP:  very tender at distal clavicle, AC jt, and anterior shoulder.  Tender in L UT, lateral shoulder, and posterior shoulder.   UPPER EXTREMITY AROM/PROM:  A/PROM Right 05/23/2021 Left 05/23/2021  Shoulder flexion 175 27/30 deg with pain  Shoulder scaption 175   Shoulder abduction 168 47/52 deg with pain  Shoulder adduction    Shoulder internal rotation 69 Hand to stomach  Shoulder external rotation 80 -12/-5  Elbow flexion  142  Elbow extension -3 deg -14/-12  Wrist flexion    Wrist extension    Wrist ulnar deviation    Wrist radial deviation    Wrist pronation    Wrist supination    (Blank rows = not tested)  Pt is guarded with PROM.     TODAY'S TREATMENT:  Pt unable to perform supine hands or wrist clasped flexion AAROM.  Pt performed scapular retractions x 10 reps, seated elbow AROM approx 8 reps and elbow AAROM x 10 reps, hand pumps x 20 reps, and seated table slides in flexion x 10 reps.  Pt had increased pain with elbow AROM/AAROM.  Unable to give a handout to pt due to the printer though did email HEP to pt.   PATIENT EDUCATION: Education details: POC and Dx.  Answered Pt's questions.  Pt received a HEP handout and was educated in correct form and appropriate frequency.  Pt instructed she should not have increased pain with HEP.  Instructed pt to ice shoulder after Rx if having increased pain.    Person educated: Patient Education method: Explanation, Demonstration, Tactile cues, Verbal cues, and Handouts Education comprehension: verbalized understanding, returned demonstration,  verbal cues required, tactile cues required, and needs further education   HOME  EXERCISE PROGRAM:  Access Code: [email or web address] URL: [email or web address] Date: 05/23/2021 Prepared by: Aaron Edelman  Exercises Seated Shoulder Flexion Towel Slide at Table Top - 2-3 x daily - 7 x weekly - 1 sets - 10 reps Seated Scapular Retraction - 2 x daily - 7 x weekly - 2 sets - 10 reps Hand Pumps 20 reps 3 x daily   ASSESSMENT:  CLINICAL IMPRESSION: Patient is a 55 y.o. female 3 weeks and 3 days s/p fall with a dx of L shoulder contusion presenting to the clinic with L shoulder pain, limited ROM in L shoulder, and L UE muscle weakness.  Pt uses a sling.   Pt is guarded and has pain with PROM.   Pt has pain and limitations with self care activities including bathing and dressing.  Pt has increased pain with L shoulder mobility and is significantly limited with reaching and unable to perform overhead activities with L UE.  She is limited with lifting objects.  Pt has disturbed sleep.  She is unable to perform her normal occupational activities.  Patient should benefit from skilled PT to address above impairments and improve overall function.        Objective impairments include decreased activity tolerance, decreased mobility, decreased ROM, decreased strength, hypomobility, impaired flexibility, impaired UE functional use, and pain. These impairments are limiting patient from cleaning, driving, meal prep, occupation, laundry, shopping, and self care act's and reaching activities .    REHAB POTENTIAL: Good  CLINICAL DECISION MAKING: Stable/uncomplicated  EVALUATION COMPLEXITY: Low   GOALS:   SHORT TERM GOALS:  STG Name Target Date Goal status  1 Pt will be independent and compliant with HEP for improved ROM, pain, and function.   Baseline:  06/06/2021 INITIAL  2 Pt will be able to tolerate AAROM and demo at least 90 deg of supine flexion AAROM for improved mobility and stiffness.  Baseline:  06/06/2021 INITIAL  3 Pt will demo improved AROM/PROM at least 70/90 deg in  flexion and 25/30 deg of ER for improved mobility and stiffness. Baseline: 06/13/2021 INITIAL  4 Pt will be able to actively elevate L shoulder > 90 deg without significant difficulty for improved reaching.  Baseline: 06/20/2021 INITIAL  5 Pt will report at least a 50% improvement in performing self care activities.  Baseline: 06/20/2021 INITIAL             LONG TERM GOALS:   LTG Name Target Date Goal status  1 Pt will demo L shoulder AROM to be [facility] t/o for performance of ADLs and IADLs Baseline: 07/04/2021 INITIAL  2 Pt will be able to dress without significant pain or difficulty.  Baseline: 06/27/2021 INITIAL  3 Pt will be able to perform her ADLs and IADLs without significant difficulty or pain.  Baseline: 07/04/2021 INITIAL  4 Pt will be able to perform her normal reaching and overhead activities without significant pain or difficulty.  Baseline: 07/04/2021 INITIAL  5 Pt will return to work as allowed by MD without adverse effects Baseline: 07/04/2021 INITIAL  6 Pt will demo at least 4 to 4+/5 MMT strength t/o L shoulder for improved tolerance to functional lifting/carrying and to assist with returning to work. Baseline: 07/04/2021 INITIAL        PLAN: PT FREQUENCY: 2x/week  PT DURATION: 6 weeks  PLANNED INTERVENTIONS: Therapeutic exercises, Therapeutic activity, Neuro Muscular re-education, Patient/Family education, Joint mobilization, Aquatic Therapy, Dry Needling, Electrical stimulation,  Spinal mobilization, Cryotherapy, Moist heat, Taping, Ultrasound, and Manual therapy  PLAN FOR NEXT SESSION: Review and perform HEP.  Shoulder PROM and AAROM. Ice as needed.   Audie Clear III PT, DPT 05/24/21 11:44 AM

## 2021-05-23 ENCOUNTER — Encounter (HOSPITAL_BASED_OUTPATIENT_CLINIC_OR_DEPARTMENT_OTHER): Payer: Self-pay | Admitting: Physical Therapy

## 2021-05-23 ENCOUNTER — Other Ambulatory Visit: Payer: Self-pay

## 2021-05-23 ENCOUNTER — Ambulatory Visit (HOSPITAL_BASED_OUTPATIENT_CLINIC_OR_DEPARTMENT_OTHER): Payer: 59 | Attending: Orthopaedic Surgery | Admitting: Physical Therapy

## 2021-05-23 DIAGNOSIS — M6281 Muscle weakness (generalized): Secondary | ICD-10-CM | POA: Insufficient documentation

## 2021-05-23 DIAGNOSIS — M25512 Pain in left shoulder: Secondary | ICD-10-CM | POA: Insufficient documentation

## 2021-05-23 DIAGNOSIS — S40012A Contusion of left shoulder, initial encounter: Secondary | ICD-10-CM | POA: Diagnosis not present

## 2021-05-23 DIAGNOSIS — M25612 Stiffness of left shoulder, not elsewhere classified: Secondary | ICD-10-CM | POA: Diagnosis present

## 2021-05-27 ENCOUNTER — Ambulatory Visit (HOSPITAL_BASED_OUTPATIENT_CLINIC_OR_DEPARTMENT_OTHER): Payer: 59 | Admitting: Physical Therapy

## 2021-05-27 ENCOUNTER — Other Ambulatory Visit: Payer: Self-pay

## 2021-05-27 ENCOUNTER — Encounter (HOSPITAL_BASED_OUTPATIENT_CLINIC_OR_DEPARTMENT_OTHER): Payer: Self-pay | Admitting: Physical Therapy

## 2021-05-27 DIAGNOSIS — M25612 Stiffness of left shoulder, not elsewhere classified: Secondary | ICD-10-CM | POA: Diagnosis not present

## 2021-05-27 DIAGNOSIS — M6281 Muscle weakness (generalized): Secondary | ICD-10-CM

## 2021-05-27 DIAGNOSIS — M25512 Pain in left shoulder: Secondary | ICD-10-CM

## 2021-05-27 NOTE — Therapy (Signed)
OUTPATIENT PHYSICAL THERAPY TREATMENT NOTE   Patient Name: Judith Pollard MRN: [id number] DOB:Nov 27, 1965, 55 y.o., female Today's Date: 05/27/2021  PCP: Arvilla Market, MD REFERRING PROVIDER: Leary Roca*   PT End of Session - 05/27/21 1023     Visit Number 2    Number of Visits 12    PT Start Time 1020    PT Stop Time 1100    PT Time Calculation (min) 40 min    Activity Tolerance Patient tolerated treatment well    Behavior During Therapy WFL for tasks assessed/performed             Past Medical History:  Diagnosis Date   Diabetes mellitus without complication (HCC)    Hypertension    History reviewed. No pertinent surgical history. Patient Active Problem List   Diagnosis Date Noted   Asthma, cough variant 08/05/2019   Prediabetes 06/27/2019   Essential hypertension 06/26/2019   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/26/2019   Non-seasonal allergic rhinitis 06/26/2019   Morbid obesity (HCC) 06/26/2019   Hyperglycemia 06/26/2019     THERAPY DIAG:  Stiffness of left shoulder, not elsewhere classified  Left shoulder pain, unspecified chronicity  Muscle weakness (generalized)  REFERRING PROVIDER: Huel Cote, MD   REFERRING DIAG: S40.012A (ICD-10-CM) - Contusion of left shoulder, initial encounter       ONSET DATE: 04/29/2021   SUBJECTIVE:                                                                                                                                                                                       SUBJECTIVE STATEMENT: -Pt's worst pain is when lying down and she has difficulty sleeping.  Pt states she is improving.  Pt has increased pain with L shoulder mobility and is significantly limited with reaching.  Pt is limited with lifting objects.  Pt is unable to perform overhead act's with L UE.  Pt has pain and limitations with dressing and bathing.  Pt is not driving.  Pt states she does have L shoulder pain when arm is  hanging down out of sling for awhile though is better.    -Pt reports she has been spending more time out of the sling.  She wears her sling when out in the mall or riding for a long time, but not wearing it at home.  Pt reports having increased pain, not severe pain, after prior Rx.  Pt took tylenol after prior Rx. Pt reports compliance with HEP.  Pt reports improved mobility in L shoulder.  Pt was able to lift cup and hold coffee a little bit.  Pt is able to pull pants up  some with L UE.  Pt has not had a MRI scheduled yet.       PERTINENT HISTORY: Pt currently using sling.  prediabetes   PAIN:  Are you having pain? Yes NRPS scale: 6/10 Current, 8/10 worst, 6/10 best Pain location: L superior and posterior shoulder Aggravating factors: lying down, certain movements Relieving factors: ice > heat, dosepak   PRECAUTIONS: per dx   WEIGHT BEARING RESTRICTIONS  Pt wears sling in crowd and outside of home.  Pt states she was not given an WB'ing restrictions and none seen in chart.     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  She is not performing her work activities due to injury.    PLOF: Independent.  Pt was able to perform all of her ADLs/IADLs and normal reaching and overhead activities without shoulder pain or difficulty.  Pt was able to perform all of her normal occupational activities without limitation.     PATIENT GOALS using her L like normal.  Return to PLOF   OBJECTIVE:    DIAGNOSTIC FINDINGS:  MD note indicated there appeared to be inferior Bankart bony loss with regard to the left shoulder otherwise normal and L wrist was normal.  X rays in Epic indicated No acute osseous abnormality about the left shoulder. 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     TODAY'S TREATMENT:    OBSERVATION:          Pt not wearing sling.                                 PALPATION: TTP:  very tender at distal clavicle, AC jt, and anterior  shoulder.  Tender in L UT, lateral shoulder, and posterior shoulder.               Therapeutic Exercise: -Reviewed current function, pain level, response to prior Rx, and HEP compliance. -Pt received L shoulder PROM in flexion, abd, ER, and IR per pt tolerance and tissue tolerance.  Pt is guarded with ROM. -Pt performed:  Pt unable to perform supine hands clasped flexion AAROM and wand flexion. scapular retractions x 10 reps,  seated elbow flex/ext AAROM 2 x 10 reps hand pumps x 20 reps seated table slides in flexion 2 x 10 reps Supine wand press approx 6-7 reps Supine wand ER 2x10 reps        PATIENT EDUCATION: Education details: POC and Dx.  Answered Pt's questions.  Pt received a HEP handout and was educated in correct form and appropriate frequency.  Pt instructed she should not have increased pain with HEP.  Instructed pt to ice shoulder after Rx if having increased pain.    Person educated: Patient Education method: Explanation, Demonstration, Tactile cues, Verbal cues, and Handouts Education comprehension: verbalized understanding, returned demonstration, verbal cues required, tactile cues required, and needs further education     HOME EXERCISE PROGRAM:           Access Code: [email or web address] URL: [email or web address] Date: 05/23/2021 Prepared by: Aaron Edelman   Exercises Seated Shoulder Flexion Towel Slide at Table Top - 2-3 x daily - 7 x weekly - 1 sets - 10 reps Seated Scapular Retraction - 2 x daily - 7 x weekly - 2 sets - 10 reps Hand Pumps 20 reps 3 x daily     ASSESSMENT:   CLINICAL IMPRESSION: Patient presents to Rx without sling.  She has pain and significant limitations with functional usage of R UE  though reports improved mobility.  She reports improved function including holding a coffee cup and pulling her pants up.  She continues to be guarded with L shoulder PROM and required cuing to relax L UE.  Pt had improved PROM compared to prior Rx though  has significant limitations.  She has pain with PROM.  Pt is limited in ther ex due to pain though does have better tolerance today.  Pt responded well to Rx reporting no increased pain after Rx.  Patient should benefit from skilled PT to address goals and impairments and improve overall function.         Objective impairments include decreased activity tolerance, decreased mobility, decreased ROM, decreased strength, hypomobility, impaired flexibility, impaired UE functional use, and pain. These impairments are limiting patient from cleaning, driving, meal prep, occupation, laundry, shopping, and self care act's and reaching activities .     REHAB POTENTIAL: Good   CLINICAL DECISION MAKING: Stable/uncomplicated   EVALUATION COMPLEXITY: Low     GOALS:     SHORT TERM GOALS:   STG Name Target Date Goal status  1 Pt will be independent and compliant with HEP for improved ROM, pain, and function.   Baseline:  06/06/2021 INITIAL  2 Pt will be able to tolerate AAROM and demo at least 90 deg of supine flexion AAROM for improved mobility and stiffness.  Baseline:  06/06/2021 INITIAL  3 Pt will demo improved AROM/PROM at least 70/90 deg in flexion and 25/30 deg of ER for improved mobility and stiffness. Baseline: 06/13/2021 INITIAL  4 Pt will be able to actively elevate L shoulder > 90 deg without significant difficulty for improved reaching.  Baseline: 06/20/2021 INITIAL  5 Pt will report at least a 50% improvement in performing self care activities.  Baseline: 06/20/2021 INITIAL                      LONG TERM GOALS:    LTG Name Target Date Goal status  1 Pt will demo L shoulder AROM to be [facility] t/o for performance of ADLs and IADLs Baseline: 07/04/2021 INITIAL  2 Pt will be able to dress without significant pain or difficulty.  Baseline: 06/27/2021 INITIAL  3 Pt will be able to perform her ADLs and IADLs without significant difficulty or pain.  Baseline: 07/04/2021 INITIAL  4 Pt will be able to  perform her normal reaching and overhead activities without significant pain or difficulty.  Baseline: 07/04/2021 INITIAL  5 Pt will return to work as allowed by MD without adverse effects Baseline: 07/04/2021 INITIAL  6 Pt will demo at least 4 to 4+/5 MMT strength t/o L shoulder for improved tolerance to functional lifting/carrying and to assist with returning to work. Baseline: 07/04/2021 INITIAL             PLAN: PT FREQUENCY: 2x/week   PT DURATION: 6 weeks   PLANNED INTERVENTIONS: Therapeutic exercises, Therapeutic activity, Neuro Muscular re-education, Patient/Family education, Joint mobilization, Aquatic Therapy, Dry Needling, Electrical stimulation, Spinal mobilization, Cryotherapy, Moist heat, Taping, Ultrasound, and Manual therapy   PLAN FOR NEXT SESSION: Review and perform HEP.  Shoulder PROM and AAROM. Ice as needed.  Pt sees MD on Monday   Audie Clear III PT, DPT 05/27/21 12:33 PM

## 2021-05-31 ENCOUNTER — Encounter (HOSPITAL_BASED_OUTPATIENT_CLINIC_OR_DEPARTMENT_OTHER): Payer: Self-pay | Admitting: Orthopaedic Surgery

## 2021-05-31 ENCOUNTER — Ambulatory Visit (HOSPITAL_BASED_OUTPATIENT_CLINIC_OR_DEPARTMENT_OTHER): Payer: 59 | Admitting: Orthopaedic Surgery

## 2021-06-01 ENCOUNTER — Other Ambulatory Visit: Payer: Self-pay

## 2021-06-01 ENCOUNTER — Ambulatory Visit (HOSPITAL_BASED_OUTPATIENT_CLINIC_OR_DEPARTMENT_OTHER): Payer: 59 | Admitting: Physical Therapy

## 2021-06-01 ENCOUNTER — Encounter (HOSPITAL_BASED_OUTPATIENT_CLINIC_OR_DEPARTMENT_OTHER): Payer: Self-pay | Admitting: Physical Therapy

## 2021-06-01 DIAGNOSIS — M25512 Pain in left shoulder: Secondary | ICD-10-CM

## 2021-06-01 DIAGNOSIS — M6281 Muscle weakness (generalized): Secondary | ICD-10-CM

## 2021-06-01 DIAGNOSIS — M25612 Stiffness of left shoulder, not elsewhere classified: Secondary | ICD-10-CM | POA: Diagnosis not present

## 2021-06-01 NOTE — Therapy (Signed)
OUTPATIENT PHYSICAL THERAPY TREATMENT NOTE   Patient Name: Judith Pollard MRN: [id number] DOB:[birth year], 55 y.o., female Today's Date: 06/02/2021  PCP: Arvilla Market, MD    PT End of Session - 06/01/21 1359     Visit Number 3    Number of Visits 12    PT Start Time 1356    PT Stop Time 1438    PT Time Calculation (min) 42 min    Activity Tolerance Patient tolerated treatment well    Behavior During Therapy WFL for tasks assessed/performed             Past Medical History:  Diagnosis Date   Diabetes mellitus without complication (HCC)    Hypertension    History reviewed. No pertinent surgical history. Patient Active Problem List   Diagnosis Date Noted   Asthma, cough variant 08/05/2019   Prediabetes 06/27/2019   Essential hypertension 06/26/2019   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/26/2019   Non-seasonal allergic rhinitis 06/26/2019   Morbid obesity (HCC) 06/26/2019   Hyperglycemia 06/26/2019     THERAPY DIAG:  Stiffness of left shoulder, not elsewhere classified  Left shoulder pain, unspecified chronicity  Muscle weakness (generalized)  REFERRING PROVIDER: Huel Cote, MD   REFERRING DIAG: S40.012A (ICD-10-CM) - Contusion of left shoulder, initial encounter       ONSET DATE: 04/29/2021   SUBJECTIVE:                                                                                                                                                                                       SUBJECTIVE STATEMENT: -Pt reports she has been spending more time out of the sling.  She wears her sling when out in the mall or riding for a long time, but not wearing it at home.   Pt denies any adverse effects after prior Rx, just stiffness. Pt reports compliance with HEP.  Pt states mobility has improved in L shoulder.  "It's getting better".  Pt reports having increased pain when she applied wt thru L elbow/UE when trying to lie on stomach.   Pt saw MD yesterday  and her MRI is scheduled for 06/07/2021  FUNCTIONAL IMPROVEMENTS:  pick up a shirt, pulling pants, mobility, lift a cup and hold coffee a little bit.  Donning shirt and sweatshirt.  Not using the sling as much. FUNCTIONAL LIMITATIONS:  reaching up, household chore, overhead activities, dressing though improving.  Pt is not driving.  limited with lifting objects.  worst pain is when lying down and she has difficulty sleeping.   PERTINENT HISTORY: Pt currently using sling.  prediabetes   PAIN:  Are you having pain? Yes NRPS  scale: 5/10 Current, 7/10 worst, 5/10 best Pain location: L superior and posterior shoulder Aggravating factors: lying down, certain movements Relieving factors: ice > heat, dosepak   PRECAUTIONS: per dx   WEIGHT BEARING RESTRICTIONS  Pt wears sling in crowd and outside of home.  Pt states she was not given an WB'ing restrictions and none seen in chart.     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  She is not performing her work activities due to injury.    PLOF: Independent.  Pt was able to perform all of her ADLs/IADLs and normal reaching and overhead activities without shoulder pain or difficulty.  Pt was able to perform all of her normal occupational activities without limitation.     PATIENT GOALS using her L like normal.  Return to PLOF   OBJECTIVE:    DIAGNOSTIC FINDINGS:  MD note indicated there appeared to be inferior Bankart bony loss with regard to the left shoulder otherwise normal and L wrist was normal.  X rays in Epic indicated No acute osseous abnormality about the left shoulder. 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     TODAY'S TREATMENT:    OBSERVATION:          Pt not wearing sling.                                 PALPATION: TTP:  very tender at distal clavicle, AC jt, and anterior shoulder.  Tender in L UT, lateral shoulder, and posterior shoulder.               Therapeutic  Exercise: -Reviewed current function, pain level, response to prior Rx, and HEP compliance. -Pt received L shoulder PROM in flexion, abd, ER, and IR per pt tolerance and tissue tolerance.  Pt is guarded with ROM. -Pt performed:  Pt performed supine wand flexion x 8 reps and and supine hands clasped flexion AAROM x 5 reps--Pt had increased pain scapular retractions x 10 reps,  seated elbow flex/ext AAROM 2 x 10 reps seated table slides in flexion 2 x 10 reps and 2x10 reps with p-ball Attempted seated abd and scaption table slides though stopped due to pain Supine wand ER 2 x10 reps Seated wand ER 2 x 10 reps  -Reviewed and updated HEP     PATIENT EDUCATION: Education details: POC and Dx.  Answered Pt's questions.  Pt received a HEP handout and was educated in correct form and appropriate frequency.  Pt instructed she should not have increased pain with HEP.  Instructed pt to ice shoulder after Rx if having increased pain.    Person educated: Patient Education method: Explanation, Demonstration, Tactile cues, Verbal cues, and Handouts Education comprehension: verbalized understanding, returned demonstration, verbal cues required, tactile cues required, and needs further education     HOME EXERCISE PROGRAM:      Access Code: [email or web address] URL: [email or web address] Date: 06/01/2021 Prepared by: Aaron Edelman  Exercises Seated Shoulder Flexion Towel Slide at Table Top - 2-3 x daily - 7 x weekly - 1 sets - 10 reps Seated Scapular Retraction - 2 x daily - 7 x weekly - 2 sets - 10 reps Seated Shoulder External Rotation AAROM with Dowel - 2 x daily - 7 x weekly - 2 sets - 10 reps Hand Pumps 20 reps 3 x daily     ASSESSMENT:   CLINICAL IMPRESSION: Patient presents to Rx  without sling.  Pt reports she is improving and is improving with function as evidenced by subjective reports.  She continues to be very limited with shoulder ROM though is improving.  Pt had decreased guarding  and improved PROm today.  She is making slow but steady progress with tolerance to exercises.  Pt responded well to Rx reporting improved pain from 5/10 before Rx to 4/10 pain after Rx.   Patient should benefit from skilled PT to address goals and impairments and improve overall function.        Objective impairments include decreased activity tolerance, decreased mobility, decreased ROM, decreased strength, hypomobility, impaired flexibility, impaired UE functional use, and pain. These impairments are limiting patient from cleaning, driving, meal prep, occupation, laundry, shopping, and self care act's and reaching activities .     REHAB POTENTIAL: Good   CLINICAL DECISION MAKING: Stable/uncomplicated   EVALUATION COMPLEXITY: Low     GOALS:     SHORT TERM GOALS:   STG Name Target Date Goal status  1 Pt will be independent and compliant with HEP for improved ROM, pain, and function.   Baseline:  06/06/2021 INITIAL  2 Pt will be able to tolerate AAROM and demo at least 90 deg of supine flexion AAROM for improved mobility and stiffness.  Baseline:  06/06/2021 INITIAL  3 Pt will demo improved AROM/PROM at least 70/90 deg in flexion and 25/30 deg of ER for improved mobility and stiffness. Baseline: 06/13/2021 INITIAL  4 Pt will be able to actively elevate L shoulder > 90 deg without significant difficulty for improved reaching.  Baseline: 06/20/2021 INITIAL  5 Pt will report at least a 50% improvement in performing self care activities.  Baseline: 06/20/2021 INITIAL                      LONG TERM GOALS:    LTG Name Target Date Goal status  1 Pt will demo L shoulder AROM to be [facility] t/o for performance of ADLs and IADLs Baseline: 07/04/2021 INITIAL  2 Pt will be able to dress without significant pain or difficulty.  Baseline: 06/27/2021 INITIAL  3 Pt will be able to perform her ADLs and IADLs without significant difficulty or pain.  Baseline: 07/04/2021 INITIAL  4 Pt will be able to perform her  normal reaching and overhead activities without significant pain or difficulty.  Baseline: 07/04/2021 INITIAL  5 Pt will return to work as allowed by MD without adverse effects Baseline: 07/04/2021 INITIAL  6 Pt will demo at least 4 to 4+/5 MMT strength t/o L shoulder for improved tolerance to functional lifting/carrying and to assist with returning to work. Baseline: 07/04/2021 INITIAL             PLAN: PT FREQUENCY: 2x/week   PT DURATION: 6 weeks   PLANNED INTERVENTIONS: Therapeutic exercises, Therapeutic activity, Neuro Muscular re-education, Patient/Family education, Joint mobilization, Aquatic Therapy, Dry Needling, Electrical stimulation, Spinal mobilization, Cryotherapy, Moist heat, Taping, Ultrasound, and Manual therapy   PLAN FOR NEXT SESSION: Review and perform HEP.  Shoulder PROM and AAROM. Ice as needed.  Pt has a MRI scheduled on 06/07/2021   Audie Clear III PT, DPT 06/02/21 1:41 PM

## 2021-06-02 ENCOUNTER — Telehealth: Payer: Self-pay | Admitting: Orthopaedic Surgery

## 2021-06-02 NOTE — Telephone Encounter (Signed)
Pt submitted medical release form, and $25.00 cash payment. Pt states [employer] faxed short term forms. Accepted 06/02/21

## 2021-06-03 ENCOUNTER — Telehealth: Payer: Self-pay | Admitting: Orthopaedic Surgery

## 2021-06-03 ENCOUNTER — Ambulatory Visit
Admission: RE | Admit: 2021-06-03 | Discharge: 2021-06-03 | Disposition: A | Discharge status: Home / Self Care | Payer: 59 | Source: Ambulatory Visit | Attending: Orthopaedic Surgery | Admitting: Orthopaedic Surgery

## 2021-06-03 ENCOUNTER — Other Ambulatory Visit: Payer: Self-pay

## 2021-06-03 DIAGNOSIS — S40012A Contusion of left shoulder, initial encounter: Secondary | ICD-10-CM

## 2021-06-03 NOTE — Telephone Encounter (Signed)
[employer] forms received. To [employer]. ?

## 2021-06-07 ENCOUNTER — Telehealth: Payer: Self-pay | Admitting: Orthopaedic Surgery

## 2021-06-07 ENCOUNTER — Other Ambulatory Visit: Payer: 59

## 2021-06-07 NOTE — Telephone Encounter (Signed)
Pt states [employer] has not received forms pt is asking for forms to be refaxed. Pt phone number is [phone].

## 2021-06-07 NOTE — Telephone Encounter (Signed)
[employer] called pt.

## 2021-06-08 ENCOUNTER — Encounter (HOSPITAL_BASED_OUTPATIENT_CLINIC_OR_DEPARTMENT_OTHER): Payer: Self-pay | Admitting: Physical Therapy

## 2021-06-08 ENCOUNTER — Ambulatory Visit (HOSPITAL_BASED_OUTPATIENT_CLINIC_OR_DEPARTMENT_OTHER): Payer: 59 | Attending: Orthopaedic Surgery | Admitting: Physical Therapy

## 2021-06-08 ENCOUNTER — Other Ambulatory Visit: Payer: Self-pay

## 2021-06-08 DIAGNOSIS — M6281 Muscle weakness (generalized): Secondary | ICD-10-CM | POA: Insufficient documentation

## 2021-06-08 DIAGNOSIS — M25512 Pain in left shoulder: Secondary | ICD-10-CM | POA: Insufficient documentation

## 2021-06-08 DIAGNOSIS — M25612 Stiffness of left shoulder, not elsewhere classified: Secondary | ICD-10-CM | POA: Insufficient documentation

## 2021-06-08 NOTE — Therapy (Addendum)
OUTPATIENT PHYSICAL THERAPY TREATMENT NOTE   Patient Name: Judith Pollard MRN: [id number] DOB:Jul 10, 1965, 56 y.o., female Today's Date: 06/08/2021  PCP: Nicolette Bang, MD    PT End of Session - 06/08/21 1621     Visit Number 4    Number of Visits 12    PT Start Time 1612    PT Stop Time 1636    PT Time Calculation (min) 24 min    Activity Tolerance Patient tolerated treatment well    Behavior During Therapy WFL for tasks assessed/performed             Past Medical History:  Diagnosis Date   Diabetes mellitus without complication ([place])    Hypertension    History reviewed. No pertinent surgical history. Patient Active Problem List   Diagnosis Date Noted   Asthma, cough variant 08/05/2019   Prediabetes 06/27/2019   Essential hypertension 06/26/2019   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/26/2019   Non-seasonal allergic rhinitis 06/26/2019   Morbid obesity ([place]) 06/26/2019   Hyperglycemia 06/26/2019     THERAPY DIAG:  Stiffness of left shoulder, not elsewhere classified  Left shoulder pain, unspecified chronicity  Muscle weakness (generalized)  REFERRING PROVIDER: Vanetta Mulders, MD   REFERRING DIAG: S40.012A (ICD-10-CM) - Contusion of left shoulder, initial encounter       ONSET DATE: 04/29/2021   SUBJECTIVE:                                                                                                                                                                                       SUBJECTIVE STATEMENT: -Pt hasn't been using sling unless she goes on a long trip.   Pt reports compliance with HEP.  Pt states she had a MRI last Friday.  Pt reports she was sore after prior Rx though no increased pain.  Pt reports no improvement in pain or function since last Rx.  Pt reports she has difficulty sleeping every night due to pain.   -FUNCTIONAL LIMITATIONS:  reaching up, household chores, overhead activities, dressing though improving.  limited with  lifting objects.  worst pain is when lying down and she has difficulty sleeping.   PERTINENT HISTORY: Pt currently using sling.  prediabetes   PAIN:  Are you having pain? Yes NRPS scale: 6/10 Current, 7/10 worst, 5/10 best Pain location: L superior and posterior shoulder Aggravating factors: lying down, certain movements Relieving factors: ice > heat, dosepak   PRECAUTIONS: per dx   WEIGHT BEARING RESTRICTIONS  Pt wears sling for long rides.  Pt states she was not given an [place] restrictions and none seen in chart.     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  She is not performing her work activities due to injury.    PLOF: Independent.  Pt was able to perform all of her ADLs/IADLs and normal reaching and overhead activities without shoulder pain or difficulty.  Pt was able to perform all of her normal occupational activities without limitation.     PATIENT GOALS using her L like normal.  Return to PLOF   OBJECTIVE:    DIAGNOSTIC FINDINGS:  MD note indicated there appeared to be inferior Bankart bony loss with regard to the left shoulder otherwise normal and L wrist was normal.  X rays in Epic indicated No acute osseous abnormality about the left shoulder. 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  Pt had a MRI--See Epic for results.      TODAY'S TREATMENT:    OBSERVATION:          Pt not wearing sling.                                            Therapeutic Exercise: -Reviewed current function, pain level, response to prior Rx, and HEP compliance. -Pt performed:  Pt performed: -scapular retractions x 10 reps,  -seated elbow flex/ext AAROM 2 x 10 reps -seated table slides in flexion 2 x 10 reps   -Reviewed HEP -See pt education.     PATIENT EDUCATION: Education details: POC, response to prior Rx's and PROM.  Answered Pt's questions.  Instructed pt to stop home exercises at this time except for hand pumps until she  meets with MD to determine plan.     Person educated: Patient Education method: Explanation, Demonstration, Tactile cues, Verbal cues, and Handouts Education comprehension: verbalized understanding, returned demonstration, verbal cues required, tactile cues required, and needs further education     HOME EXERCISE PROGRAM:      Access Code: [email or web address] URL: [email or web address] Date: 06/01/2021 Prepared by: Ronny Flurry  Exercises Seated Shoulder Flexion Towel Slide at Table Top - 2-3 x daily - 7 x weekly - 1 sets - 10 reps Seated Scapular Retraction - 2 x daily - 7 x weekly - 2 sets - 10 reps Seated Shoulder External Rotation AAROM with Dowel - 2 x daily - 7 x weekly - 2 sets - 10 reps Hand Pumps 20 reps 3 x daily     ASSESSMENT:   CLINICAL IMPRESSION: Pt reports no functional improvements or improvements in pain.  PT checked her MRI report while she was performing exercises.  Pt has not consulted with MD yet about MRI results and sees MD on Friday.  After seeing MRI findings, PT decided to not perform PROM and stop exercises until she meets with MD.  PT spoke with pt about HEP.  Instructed her to stop her HEP except for hand pumps until she meets and discusses MRI findings with MD to determine plan.  Pt is agreeable with POC.  Pt reports no increased pain after Rx.    Objective impairments include decreased activity tolerance, decreased mobility, decreased ROM, decreased strength, hypomobility, impaired flexibility, impaired UE functional use, and pain. These impairments are limiting patient from cleaning, driving, meal prep, occupation, laundry, shopping, and self care act's and reaching activities .         GOALS:     SHORT TERM GOALS:   STG Name Target Date Goal status  1 Pt  will be independent and compliant with HEP for improved ROM, pain, and function.   Baseline:  06/06/2021 INITIAL  2 Pt will be able to tolerate AAROM and demo at least 90 deg of supine flexion  AAROM for improved mobility and stiffness.  Baseline:  06/06/2021 INITIAL  3 Pt will demo improved AROM/PROM at least 70/90 deg in flexion and 25/30 deg of ER for improved mobility and stiffness. Baseline: 06/13/2021 INITIAL  4 Pt will be able to actively elevate L shoulder > 90 deg without significant difficulty for improved reaching.  Baseline: 06/20/2021 INITIAL  5 Pt will report at least a 50% improvement in performing self care activities.  Baseline: 06/20/2021 INITIAL                      LONG TERM GOALS:    LTG Name Target Date Goal status  1 Pt will demo L shoulder AROM to be [facility] t/o for performance of ADLs and IADLs Baseline: 07/04/2021 INITIAL  2 Pt will be able to dress without significant pain or difficulty.  Baseline: 06/27/2021 INITIAL  3 Pt will be able to perform her ADLs and IADLs without significant difficulty or pain.  Baseline: 07/04/2021 INITIAL  4 Pt will be able to perform her normal reaching and overhead activities without significant pain or difficulty.  Baseline: 07/04/2021 INITIAL  5 Pt will return to work as allowed by MD without adverse effects Baseline: 07/04/2021 INITIAL  6 Pt will demo at least 4 to 4+/5 MMT strength t/o L shoulder for improved tolerance to functional lifting/carrying and to assist with returning to work. Baseline: 07/04/2021 INITIAL             PLAN:   PLANNED INTERVENTIONS: Therapeutic exercises, Therapeutic activity, Neuro Muscular re-education, Patient/Family education, Joint mobilization, Aquatic Therapy, Dry Needling, Electrical stimulation, Spinal mobilization, Cryotherapy, Moist heat, Taping, Ultrasound, and Manual therapy   PLAN FOR NEXT SESSION: pt to be placed on hold until she sees MD to determine plan.    Selinda Michaels III PT, DPT 06/08/21 5:05 PM  PHYSICAL THERAPY DISCHARGE SUMMARY  Visits from Start of Care: 4  Current functional level related to goals / functional outcomes: Pt continued to have pain and functional  limitations.  See above   Remaining deficits: Pt continued to have pain and functional limitations.   Education / Equipment: See above   Pt met with MD who recommends surgery and Pt is agreeable.  Pt will be discharged from skilled PT services due to having surgery.  Selinda Michaels III PT, DPT 06/11/21 10:31 AM

## 2021-06-10 ENCOUNTER — Other Ambulatory Visit (HOSPITAL_BASED_OUTPATIENT_CLINIC_OR_DEPARTMENT_OTHER): Payer: Self-pay

## 2021-06-10 ENCOUNTER — Other Ambulatory Visit: Payer: Self-pay

## 2021-06-10 ENCOUNTER — Ambulatory Visit (HOSPITAL_BASED_OUTPATIENT_CLINIC_OR_DEPARTMENT_OTHER): Payer: Self-pay | Admitting: Orthopaedic Surgery

## 2021-06-10 ENCOUNTER — Ambulatory Visit (INDEPENDENT_AMBULATORY_CARE_PROVIDER_SITE_OTHER): Payer: 59 | Admitting: Orthopaedic Surgery

## 2021-06-10 DIAGNOSIS — M25312 Other instability, left shoulder: Secondary | ICD-10-CM

## 2021-06-10 MED ORDER — ACETAMINOPHEN 500 MG PO TABS
500.0000 mg | ORAL_TABLET | Freq: Three times a day (TID) | ORAL | 0 refills | Status: AC
  Filled 2021-06-10: qty 30, 10d supply, fill #0

## 2021-06-10 MED ORDER — ASPIRIN EC 325 MG PO TBEC
325.0000 mg | DELAYED_RELEASE_TABLET | Freq: Every day | ORAL | 0 refills | Status: AC
  Filled 2021-06-10: qty 30, 30d supply, fill #0

## 2021-06-10 MED ORDER — OXYCODONE HCL 5 MG PO TABS
5.0000 mg | ORAL_TABLET | ORAL | 0 refills | Status: AC | PRN
  Filled 2021-06-10: qty 20, 4d supply, fill #0

## 2021-06-10 MED ORDER — IBUPROFEN 800 MG PO TABS
800.0000 mg | ORAL_TABLET | Freq: Three times a day (TID) | ORAL | 0 refills | Status: AC
  Filled 2021-06-10: qty 30, 10d supply, fill #0

## 2021-06-10 NOTE — Progress Notes (Signed)
Chief Complaint: left shoulder pain, left wrist pain     History of Present Illness:   06/10/2021: Presents today with continued left shoulder pain.  She is having significant difficulty with any type of overhead activity.  The shoulder does not feel stable.  She has been working with physical therapy although this is extremely limited due to the ongoing pain in the shoulder.  Judith Pollard is a 56 y.o. female with left shoulder and left wrist pain after a fall the week prior.  This occurred [place].  She fell down several steps.  Since that time she has had significant pain and weakness in the shoulder.  She initially presented to the emergency room was placed in a sling.  She has limited range of motion of the shoulder.  She states that the left palm pain is improving.  She works as a Estate agentforklift operator.  She is right-hand dominant.  She has taken NSAIDs as well as Vicodin which helps somewhat.  She is remained in a sling with limited motion    Surgical History:   None  PMH/PSH/Family History/Social History/Meds/Allergies:    Past Medical History:  Diagnosis Date   Diabetes mellitus without complication (HCC)    Hypertension    No past surgical history on file. Social History   Socioeconomic History   Marital status: Married    Spouse name: Not on file   Number of children: Not on file   Years of education: Not on file   Highest education level: Not on file  Occupational History   Not on file  Tobacco Use   Smoking status: Former    Types: Cigarettes    Quit date: 12/16/2018    Years since quitting: 2.4   Smokeless tobacco: Never  Vaping Use   Vaping Use: Never used  Substance and Sexual Activity   Alcohol use: Yes    Comment: social   Drug use: Never   Sexual activity: Not Currently  Other Topics Concern   Not on file  Social History Narrative   Not on file   Social Determinants of  Health   Financial Resource Strain: Not on file  Food Insecurity: Not on file  Transportation Needs: Not on file  Physical Activity: Not on file  Stress: Not on file  Social Connections: Not on file   Family History  Family history unknown: Yes   No Known Allergies Current Outpatient Medications  Medication Sig Dispense Refill   Albuterol Sulfate (PROAIR RESPICLICK) 108 (90 Base) MCG/ACT AEPB Inhale 2 puffs into the lungs every 6 (six) hours as needed. 1 each 4   cetirizine (ZYRTEC) 10 MG tablet Take 1 tablet (10 mg total) by mouth daily. 30 tablet 11   fluticasone (FLOVENT HFA) 44 MCG/ACT inhaler Inhale 2 puffs into the lungs in the morning and at bedtime. 1 Inhaler 12   HYDROcodone-acetaminophen (NORCO/VICODIN) 5-325 MG tablet Take 1 tablet by mouth every 6 (six) hours as needed. 10 tablet 0   losartan-hydrochlorothiazide (HYZAAR) 100-12.5 MG tablet Take 1 tablet by mouth daily. 30 tablet 3   metFORMIN (GLUCOPHAGE-XR) 500 MG 24 hr tablet Take 1 tablet (500 mg total) by mouth daily with breakfast. 90 tablet 1   methylPREDNISolone (MEDROL DOSEPAK) 4 MG TBPK tablet Take per  packet instructions 21 each 0   No current facility-administered medications for this visit.   No results found.  Review of Systems:   A ROS was performed including pertinent positives and negatives as documented in the HPI.  Physical Exam :   Constitutional: NAD and appears stated age Neurological: Alert and oriented Psych: Appropriate affect and cooperative Last menstrual period 05/19/2013.   Comprehensive Musculoskeletal Exam:    Musculoskeletal Exam    Inspection Right Left  Skin No atrophy or winging No atrophy or winging  Palpation    Tenderness None Lateral deltoid  Range of Motion    Flexion (passive) 170 90  Flexion (active) 170 80  Abduction 170 80  ER at the side 70 15  Can reach behind back to T12 Front pocket  Strength     Normal 4/5 supra and infraspinatus with pain  Special Tests     Pseudoparalytic No No  Neurologic    Fires PIN, radial, median, ulnar, musculocutaneous, axillary, suprascapular, long thoracic, and spinal accessory innervated muscles. No abnormal sensibility  Vascular/Lymphatic    Radial Pulse 2+ 2+  Cervical Exam    Patient has symmetric cervical range of motion with negative Spurling's test.  Special Test: 2+ anterior load shift with pain     Imaging:   Xray (3 views left shoulder, 3 views left wrist): There appears to be inferior Bankart bony loss with regard to the left shoulder otherwise normal  Left wrist is normal   MRI left shoulder: There is a significant labral tear with bony Bankart lesion as well as a large Hill-Sachs.  Rotator cuff tendon is intact   I personally reviewed and interpreted the radiographs.   Assessment:   56 year old female with left shoulder pain after a fall directly on the side.  MRI reveals evidence of anterior shoulder dislocation with ongoing shoulder instability.  While this is her first episode, I did advise that ultimately given the large amount of approximately 20% anterior glenoid bone loss in addition to the Hill-Sachs lesion, I do believe that her outcome would be likely to be average to poor with ongoing physical therapy.  We also described that therapy is not significantly improving her quality of her shoulder and it has been over a month at this point.  Given the fact that this is plateaued I have advised that I do believe that surgical arthroscopy of the shoulder with labral repair and remplissage is indicated in order to optimize her long-term outcome.  She would like to proceed with this. Plan :    -Plan for left shoulder arthroscopy with labral repair and remplissage   Patient was prescribed a shoulder immobilizer today for the diagnosis listed above under assessment. The patient requires stabilization from this orthosis in their left arm.     After a lengthy discussion of treatment options,  including risks, benefits, alternatives, complications of surgical and nonsurgical conservative options, the patient elected surgical repair.   The patient  is aware of the material risks  and complications including, but not limited to injury to adjacent structures, neurovascular injury, infection, numbness, bleeding, implant failure, thermal burns, stiffness, persistent pain, failure to heal, disease transmission from allograft, need for further surgery, dislocation, anesthetic risks, blood clots, risks of death,and others. The probabilities of surgical success and failure discussed with patient given their particular co-morbidities.The time and nature of expected rehabilitation and recovery was discussed.The patient's questions were all answered preoperatively.  No barriers to understanding were noted. I explained the natural  history of the disease process and Rx rationale.  I explained to the patient what I considered to be reasonable expectations given their personal situation.  The final treatment plan was arrived at through a shared patient decision making process model.      I personally saw and evaluated the patient, and participated in the management and treatment plan.  Huel Cote, MD Attending Physician, Orthopedic Surgery  This document was dictated using Dragon voice recognition software. A reasonable attempt at proof reading has been made to minimize errors.

## 2021-06-14 ENCOUNTER — Encounter (HOSPITAL_BASED_OUTPATIENT_CLINIC_OR_DEPARTMENT_OTHER): Payer: 59 | Admitting: Physical Therapy

## 2021-06-15 ENCOUNTER — Encounter (HOSPITAL_BASED_OUTPATIENT_CLINIC_OR_DEPARTMENT_OTHER): Payer: Self-pay | Admitting: Orthopaedic Surgery

## 2021-06-15 ENCOUNTER — Telehealth: Payer: Self-pay | Admitting: Orthopaedic Surgery

## 2021-06-15 NOTE — Telephone Encounter (Signed)
Pt called requesting a call back from [place]. Pt states her doctor forms for a return to work date are wrong. Please call pt at [phone]

## 2021-06-15 NOTE — Telephone Encounter (Signed)
IC,lmvm advised the updated forms faxed to [facility]

## 2021-06-16 ENCOUNTER — Encounter (HOSPITAL_BASED_OUTPATIENT_CLINIC_OR_DEPARTMENT_OTHER): Payer: 59 | Admitting: Physical Therapy

## 2021-06-16 ENCOUNTER — Telehealth: Payer: Self-pay | Admitting: Orthopaedic Surgery

## 2021-06-16 NOTE — Telephone Encounter (Signed)
[employer] forms received. To [employer]. ?

## 2021-06-20 ENCOUNTER — Encounter (HOSPITAL_BASED_OUTPATIENT_CLINIC_OR_DEPARTMENT_OTHER): Payer: 59 | Admitting: Physical Therapy

## 2021-06-21 ENCOUNTER — Encounter (HOSPITAL_BASED_OUTPATIENT_CLINIC_OR_DEPARTMENT_OTHER): Payer: 59 | Admitting: Physical Therapy

## 2021-06-23 ENCOUNTER — Encounter: Payer: Self-pay | Admitting: Family Medicine

## 2021-06-23 ENCOUNTER — Other Ambulatory Visit: Payer: Self-pay

## 2021-06-23 ENCOUNTER — Ambulatory Visit: Payer: 59 | Admitting: Family Medicine

## 2021-06-23 VITALS — BP 152/98 | HR 87 | Temp 98.1°F | Resp 16 | Wt 232.4 lb

## 2021-06-23 DIAGNOSIS — R7303 Prediabetes: Secondary | ICD-10-CM | POA: Diagnosis not present

## 2021-06-23 DIAGNOSIS — Z01818 Encounter for other preprocedural examination: Secondary | ICD-10-CM

## 2021-06-23 MED ORDER — LOSARTAN POTASSIUM-HCTZ 100-25 MG PO TABS: 1.0000 | ORAL_TABLET | Freq: Every day | ORAL | 1 refills | Status: DC

## 2021-06-23 MED ORDER — AMLODIPINE BESYLATE 5 MG PO TABS: 5.0000 mg | ORAL_TABLET | Freq: Every day | ORAL | 0 refills | Status: DC

## 2021-06-23 MED ORDER — METFORMIN HCL ER 500 MG PO TB24: 500.0000 mg | ORAL_TABLET | Freq: Every day | ORAL | 1 refills | Status: AC

## 2021-06-23 NOTE — Progress Notes (Signed)
Patient is her for medical clearance for surgery  Patient is asking for refills on medication

## 2021-06-23 NOTE — Progress Notes (Signed)
Established Patient Office Visit  Subjective:  Patient ID: Judith Pollard, female    DOB: Feb 10, 1966  Age: 56 y.o. MRN: [id number]  CC:  Chief Complaint  Patient presents with   medica clearance    HPI Judith Pollard presents for surgical clearance for left shoulder/rotator cuff surgery as requested by Judith Pollard  Past Medical History:  Diagnosis Date   Diabetes mellitus without complication ([place])    Hypertension     No past surgical history on file.  Family History  Family history unknown: Yes    Social History   Socioeconomic History   Marital status: Single    Spouse name: Not on file   Number of children: Not on file   Years of education: Not on file   Highest education level: Not on file  Occupational History   Not on file  Tobacco Use   Smoking status: Former    Types: Cigarettes    Quit date: 12/16/2018    Years since quitting: 2.5   Smokeless tobacco: Never  Vaping Use   Vaping Use: Never used  Substance and Sexual Activity   Alcohol use: Yes    Comment: social   Drug use: Never   Sexual activity: Not Currently  Other Topics Concern   Not on file  Social History Narrative   Not on file   Social Determinants of Health   Financial Resource Strain: Not on file  Food Insecurity: Not on file  Transportation Needs: Not on file  Physical Activity: Not on file  Stress: Not on file  Social Connections: Not on file  Intimate Partner Violence: Not on file    ROS Review of Systems  All other systems reviewed and are negative.  Objective:   Today's Vitals: BP (!) 152/98    Pulse 87    Temp 98.1 F (36.7 C) (Oral)    Resp 16    Wt 232 lb 6.4 oz (105.4 kg)    LMP 05/19/2013    SpO2 96%    BMI (P) 39.89 kg/m   Physical Exam Vitals and nursing note reviewed.  Constitutional:      General: She is not in acute distress. HENT:     Head: Normocephalic and atraumatic.     Mouth/Throat:     Mouth: Mucous membranes are moist.     Pharynx:  Oropharynx is clear.  Cardiovascular:     Rate and Rhythm: Normal rate and regular rhythm.  Pulmonary:     Effort: Pulmonary effort is normal.     Breath sounds: Normal breath sounds.  Abdominal:     Palpations: Abdomen is soft.     Tenderness: There is no abdominal tenderness.  Musculoskeletal:     Right shoulder: Normal.     Left shoulder: Tenderness present. Decreased range of motion.     Cervical back: Normal range of motion and neck supple.     Right lower leg: No edema.     Left lower leg: No edema.  Neurological:     General: No focal deficit present.     Mental Status: She is alert and oriented to person, place, and time.  Psychiatric:        Mood and Affect: Mood normal.    Assessment & Plan:   1. Preop general physical exam Exam unremarkable with exception of elevated blood pressure. Have increased patient's med from hyzaar 100-12.5 to 100-25 and added amlodipine 5 mg daily to regimen. Will monitor. "Will consider patient to  appear to be medically stable for the stated procedure pending follow up ow blood pressure on new regimen where reading is more towards normal/acceptable.  - EKG 12-Lead - CMP14+EGFR - CBC with Differential  Outpatient Encounter Medications as of 06/23/2021  Medication Sig   Albuterol Sulfate (PROAIR RESPICLICK) 612 (90 Base) MCG/ACT AEPB Inhale 2 puffs into the lungs every 6 (six) hours as needed.   aspirin EC 325 MG tablet Take 1 tablet (325 mg total) by mouth daily.   cetirizine (ZYRTEC) 10 MG tablet Take 1 tablet (10 mg total) by mouth daily.   fluticasone (FLOVENT HFA) 44 MCG/ACT inhaler Inhale 2 puffs into the lungs in the morning and at bedtime.   HYDROcodone-acetaminophen (NORCO/VICODIN) 5-325 MG tablet Take 1 tablet by mouth every 6 (six) hours as needed.   losartan-hydrochlorothiazide (HYZAAR) 100-12.5 MG tablet Take 1 tablet by mouth daily.   metFORMIN (GLUCOPHAGE-XR) 500 MG 24 hr tablet Take 1 tablet (500 mg total) by mouth daily with  breakfast.   methylPREDNISolone (MEDROL DOSEPAK) 4 MG TBPK tablet Take per packet instructions   oxyCODONE (OXY IR/ROXICODONE) 5 MG immediate release tablet Take 1 tablet (5 mg total) by mouth every 4 (four) hours as needed (severe pain).   No facility-administered encounter medications on file as of 06/23/2021.    Follow-up: No follow-ups on file.   Becky Sax, MD

## 2021-06-24 ENCOUNTER — Encounter (HOSPITAL_BASED_OUTPATIENT_CLINIC_OR_DEPARTMENT_OTHER): Payer: 59 | Admitting: Physical Therapy

## 2021-06-27 ENCOUNTER — Encounter (HOSPITAL_BASED_OUTPATIENT_CLINIC_OR_DEPARTMENT_OTHER): Payer: 59 | Admitting: Physical Therapy

## 2021-06-29 ENCOUNTER — Encounter: Payer: Self-pay | Admitting: Family Medicine

## 2021-06-29 ENCOUNTER — Other Ambulatory Visit: Payer: Self-pay

## 2021-06-29 ENCOUNTER — Ambulatory Visit (INDEPENDENT_AMBULATORY_CARE_PROVIDER_SITE_OTHER): Payer: 59 | Admitting: Family Medicine

## 2021-06-29 VITALS — BP 138/84 | HR 85 | Temp 98.3°F | Resp 16 | Wt 232.0 lb

## 2021-06-29 DIAGNOSIS — I10 Essential (primary) hypertension: Secondary | ICD-10-CM | POA: Diagnosis not present

## 2021-06-29 NOTE — Progress Notes (Signed)
Patient is here for follow-up blood pressure check for surgical  clearance.

## 2021-06-30 ENCOUNTER — Telehealth: Payer: Self-pay | Admitting: Orthopaedic Surgery

## 2021-06-30 ENCOUNTER — Encounter (HOSPITAL_BASED_OUTPATIENT_CLINIC_OR_DEPARTMENT_OTHER): Payer: 59 | Admitting: Physical Therapy

## 2021-06-30 NOTE — Progress Notes (Signed)
Established Patient Office Visit  Subjective:  Patient ID: Judith Pollard, female    DOB: 05-May-1966  Age: 56 y.o. MRN: [id number]  CC:  Chief Complaint  Patient presents with   Follow-up   Hypertension    HPI Judith Pollard presents for follow up of hypertension that was required before giving medical clearnac for upcoming procedure.   Past Medical History:  Diagnosis Date   Diabetes mellitus without complication (HCC)    Hypertension     No past surgical history on file.  Family History  Family history unknown: Yes    Social History   Socioeconomic History   Marital status: Single    Spouse name: Not on file   Number of children: Not on file   Years of education: Not on file   Highest education level: Not on file  Occupational History   Not on file  Tobacco Use   Smoking status: Former    Types: Cigarettes    Quit date: 12/16/2018    Years since quitting: 2.5   Smokeless tobacco: Never  Vaping Use   Vaping Use: Never used  Substance and Sexual Activity   Alcohol use: Yes    Comment: social   Drug use: Never   Sexual activity: Not Currently  Other Topics Concern   Not on file  Social History Narrative   Not on file   Social Determinants of Health   Financial Resource Strain: Not on file  Food Insecurity: Not on file  Transportation Needs: Not on file  Physical Activity: Not on file  Stress: Not on file  Social Connections: Not on file  Intimate Partner Violence: Not on file    ROS Review of Systems  All other systems reviewed and are negative.  Objective:   Today's Vitals: BP 138/84    Pulse 85    Temp 98.3 F (36.8 C) (Oral)    Resp 16    Wt 232 lb (105.2 kg)    LMP 05/19/2013    SpO2 95%    BMI (P) 39.82 kg/m   Physical Exam Vitals and nursing note reviewed.  Constitutional:      General: She is not in acute distress. Cardiovascular:     Rate and Rhythm: Normal rate and regular rhythm.     Heart sounds:    Friction rub: .Marland Kitchen   Pulmonary:     Effort: Pulmonary effort is normal.     Breath sounds: Normal breath sounds.  Abdominal:     Palpations: Abdomen is soft.     Tenderness: There is no abdominal tenderness.  Musculoskeletal:     Right lower leg: No edema.     Left lower leg: No edema.  Neurological:     General: No focal deficit present.     Mental Status: She is alert and oriented to person, place, and time.    Assessment & Plan:   1. Essential hypertension Readings much improved with present management. Continue. Improvement allows for medical clearnace for upcoming procedure. Form completed giving medical clearance.    Outpatient Encounter Medications as of 06/29/2021  Medication Sig   Albuterol Sulfate (PROAIR RESPICLICK) 108 (90 Base) MCG/ACT AEPB Inhale 2 puffs into the lungs every 6 (six) hours as needed.   amLODipine (NORVASC) 5 MG tablet Take 1 tablet (5 mg total) by mouth daily.   aspirin EC 325 MG tablet Take 1 tablet (325 mg total) by mouth daily.   cetirizine (ZYRTEC) 10 MG tablet Take 1 tablet (10  mg total) by mouth daily.   fluticasone (FLOVENT HFA) 44 MCG/ACT inhaler Inhale 2 puffs into the lungs in the morning and at bedtime.   HYDROcodone-acetaminophen (NORCO/VICODIN) 5-325 MG tablet Take 1 tablet by mouth every 6 (six) hours as needed.   losartan-hydrochlorothiazide (HYZAAR) 100-25 MG tablet Take 1 tablet by mouth daily.   metFORMIN (GLUCOPHAGE-XR) 500 MG 24 hr tablet Take 1 tablet (500 mg total) by mouth daily with breakfast.   methylPREDNISolone (MEDROL DOSEPAK) 4 MG TBPK tablet Take per packet instructions   oxyCODONE (OXY IR/ROXICODONE) 5 MG immediate release tablet Take 1 tablet (5 mg total) by mouth every 4 (four) hours as needed (severe pain).   [DISCONTINUED] losartan-hydrochlorothiazide (HYZAAR) 100-12.5 MG tablet Take 1 tablet by mouth daily.   No facility-administered encounter medications on file as of 06/29/2021.    Follow-up: No follow-ups on file.   [name], MD

## 2021-06-30 NOTE — Telephone Encounter (Signed)
Pt submitted medical release form, FMLA forms, and $25.00 cash payment to [employer]. Accepted 06/30/21

## 2021-07-04 NOTE — Progress Notes (Signed)
Surgical Instructions    Your procedure is scheduled on July 11, 2021.  Report to [facility] Main Entrance "A" at 10:10 A.M., then check in with the Admitting office.  Call this number if you have problems the morning of surgery:  [phone]   If you have any questions prior to your surgery date call [phone]: Open Monday-Friday 8am-4pm    Remember:  Do not eat after midnight the night before your surgery  You may drink clear liquids until 9:10am the morning of your surgery.   Clear liquids allowed are: Water, Non-Citrus Juices (without pulp), Carbonated Beverages, Clear Tea, Black Coffee ONLY (NO MILK, CREAM OR POWDERED CREAMER of any kind), and Gatorade   Enhanced Recovery after Surgery for Orthopedics Enhanced Recovery after Surgery is a protocol used to improve the stress on your body and your recovery after surgery.  Patient Instructions  The day of surgery (if you have diabetes):  Drink ONE small 10 oz bottle of water OR G2 Gatorade by 9:10am the morning of surgery This bottle was given to you during your hospital  pre-op appointment visit.  Nothing else to drink after completing the  Small 10 oz bottle of water or G2 Gatorade.         If you have questions, please contact your surgeons office.     Take these medicines the morning of surgery with A SIP OF WATER: Amlodipine (Norvasc)   If needed: Acetaminophen (Tylenol) Albuterol Sulfate (Proair Respiclick) inhaler  Please bring all inhalers with you the day of surgery.    As of today, STOP taking any Aspirin (unless otherwise instructed by your surgeon) Aleve, Naproxen, Ibuprofen, Motrin, Advil, Goody's, BC's, all herbal medications, fish oil, and all vitamins.  WHAT DO I DO ABOUT MY DIABETES MEDICATION?  Do not take oral diabetes medicines (pills) the morning of surgery. DO NOT take Metformin (Glucophage-XR) the morning of surgery.   HOW TO MANAGE YOUR DIABETES BEFORE AND AFTER SURGERY  Why is it  important to control my blood sugar before and after surgery? Improving blood sugar levels before and after surgery helps healing and can limit problems. A way of improving blood sugar control is eating a healthy diet by:  Eating less sugar and carbohydrates  Increasing activity/exercise  Talking with your doctor about reaching your blood sugar goals High blood sugars (greater than 180 mg/dL) can raise your risk of infections and slow your recovery, so you will need to focus on controlling your diabetes during the weeks before surgery. Make sure that the doctor who takes care of your diabetes knows about your planned surgery including the date and location.  How do I manage my blood sugar before surgery? Check your blood sugar at least 4 times a day, starting 2 days before surgery, to make sure that the level is not too high or low.  Check your blood sugar the morning of your surgery when you wake up and every 2 hours until you get to the Short Stay unit.  If your blood sugar is less than 70 mg/dL, you will need to treat for low blood sugar: Do not take insulin. Treat a low blood sugar (less than 70 mg/dL) with  cup of clear juice (cranberry or apple), 4 glucose tablets, OR glucose gel. Recheck blood sugar in 15 minutes after treatment (to make sure it is greater than 70 mg/dL). If your blood sugar is not greater than 70 mg/dL on recheck, call [phone] for further instructions. Report your blood sugar  to the short stay nurse when you get to Short Stay.  If you are admitted to the hospital after surgery: Your blood sugar will be checked by the staff and you will probably be given insulin after surgery (instead of oral diabetes medicines) to make sure you have good blood sugar levels. The goal for blood sugar control after surgery is 80-180 mg/dL.   After your COVID test   You are not required to quarantine however you are required to wear a well-fitting mask when you are out and around  people not in your household.  If your mask becomes wet or soiled, replace with a new one.  Wash your hands often with soap and water for 20 seconds or clean your hands with an alcohol-based hand sanitizer that contains at least 60% alcohol.  Do not share personal items.  Notify your provider: if you are in close contact with someone who has COVID  or if you develop a fever of 100.4 or greater, sneezing, cough, sore throat, shortness of breath or body aches.           Do not wear jewelry or makeup Do not wear lotions, powders, perfumes, or deodorant. Do not shave 48 hours prior to surgery.   Do not bring valuables to the hospital. Do not wear nail polish, gel polish, artificial nails, or any other type of covering on natural nails (fingers and toes) If you have artificial nails or gel coating that need to be removed by a nail salon, please have this removed prior to surgery. Artificial nails or gel coating may interfere with anesthesia's ability to adequately monitor your vital signs.             [facility] is not responsible for any belongings or valuables.  Do NOT Smoke (Tobacco/Vaping)  24 hours prior to your procedure  If you use a CPAP at night, you may bring your mask for your overnight stay.   Contacts, glasses, hearing aids, dentures or partials may not be worn into surgery, please bring cases for these belongings   For patients admitted to the hospital, discharge time will be determined by your treatment team.   Patients discharged the day of surgery will not be allowed to drive home, and someone needs to stay with them for 24 hours.  NO VISITORS WILL BE ALLOWED IN PRE-OP WHERE PATIENTS ARE PREPPED FOR SURGERY.  ONLY 1 SUPPORT PERSON MAY BE PRESENT IN THE WAITING ROOM WHILE YOU ARE IN SURGERY.  IF YOU ARE TO BE ADMITTED, ONCE YOU ARE IN YOUR ROOM YOU WILL BE ALLOWED TWO (2) VISITORS. 1 (ONE) VISITOR MAY STAY OVERNIGHT BUT MUST ARRIVE TO THE ROOM BY 8pm.  Minor children may  have two parents present. Special consideration for safety and communication needs will be reviewed on a case by case basis.  Special instructions:    Oral Hygiene is also important to reduce your risk of infection.  Remember - BRUSH YOUR TEETH THE MORNING OF SURGERY WITH YOUR REGULAR TOOTHPASTE   Coffee- Preparing For Surgery  Before surgery, you can play an important role. Because skin is not sterile, your skin needs to be as free of germs as possible. You can reduce the number of germs on your skin by washing with CHG (chlorahexidine gluconate) Soap before surgery.  CHG is an antiseptic cleaner which kills germs and bonds with the skin to continue killing germs even after washing.     Please do not use if you  have an allergy to CHG or antibacterial soaps. If your skin becomes reddened/irritated stop using the CHG.  Do not shave (including legs and underarms) for at least 48 hours prior to first CHG shower. It is OK to shave your face.  Please follow these instructions carefully.     Shower the NIGHT BEFORE SURGERY and the MORNING OF SURGERY with CHG Soap.   If you chose to wash your hair, wash your hair first as usual with your normal shampoo. After you shampoo, rinse your hair and body thoroughly to remove the shampoo.  Then [employer] and genitals (private parts) with your normal soap and rinse thoroughly to remove soap.  After that Use CHG Soap as you would any other liquid soap. You can apply CHG directly to the skin and wash gently with a scrungie or a clean washcloth.   Apply the CHG Soap to your body ONLY FROM THE NECK DOWN.  Do not use on open wounds or open sores. Avoid contact with your eyes, ears, mouth and genitals (private parts). Wash Face and genitals (private parts)  with your normal soap.   Wash thoroughly, paying special attention to the area where your surgery will be performed.  Thoroughly rinse your body with warm water from the neck down.  DO NOT shower/wash  with your normal soap after using and rinsing off the CHG Soap.  Pat yourself dry with a CLEAN TOWEL.  Wear CLEAN PAJAMAS to bed the night before surgery  Place CLEAN SHEETS on your bed the night before your surgery  DO NOT SLEEP WITH PETS.   Day of Surgery:  Take a shower with CHG soap. Wear Clean/Comfortable clothing the morning of surgery Do not apply any deodorants/lotions.   Remember to brush your teeth WITH YOUR REGULAR TOOTHPASTE.   Please read over the following fact sheets that you were given.

## 2021-07-05 ENCOUNTER — Encounter (HOSPITAL_COMMUNITY): Payer: Self-pay

## 2021-07-05 ENCOUNTER — Other Ambulatory Visit: Payer: Self-pay

## 2021-07-05 ENCOUNTER — Encounter (HOSPITAL_COMMUNITY)
Admission: RE | Admit: 2021-07-05 | Discharge: 2021-07-05 | Disposition: A | Discharge status: Home / Self Care | Payer: 59 | Source: Ambulatory Visit | Attending: Orthopaedic Surgery | Admitting: Orthopaedic Surgery

## 2021-07-05 VITALS — BP 138/80 | HR 79 | Temp 98.2°F | Resp 17 | Ht 64.0 in | Wt 232.0 lb

## 2021-07-05 DIAGNOSIS — R7303 Prediabetes: Secondary | ICD-10-CM | POA: Diagnosis not present

## 2021-07-05 DIAGNOSIS — I10 Essential (primary) hypertension: Secondary | ICD-10-CM | POA: Diagnosis not present

## 2021-07-05 DIAGNOSIS — Z01812 Encounter for preprocedural laboratory examination: Secondary | ICD-10-CM | POA: Diagnosis not present

## 2021-07-05 DIAGNOSIS — Z01818 Encounter for other preprocedural examination: Secondary | ICD-10-CM

## 2021-07-05 LAB — CBC
HCT: 40.1 % (ref 36.0–46.0)
Hemoglobin: 12.8 g/dL (ref 12.0–15.0)
MCH: 26.8 pg (ref 26.0–34.0)
MCHC: 31.9 g/dL (ref 30.0–36.0)
MCV: 83.9 fL (ref 80.0–100.0)
Platelets: 362 10*3/uL (ref 150–400)
RBC: 4.78 MIL/uL (ref 3.87–5.11)
RDW: 13.9 % (ref 11.5–15.5)
WBC: 6.3 10*3/uL (ref 4.0–10.5)
nRBC: 0 % (ref 0.0–0.2)

## 2021-07-05 LAB — BASIC METABOLIC PANEL
Anion gap: 8 (ref 5–15)
BUN: 12 mg/dL (ref 6–20)
CO2: 29 mmol/L (ref 22–32)
Calcium: 11.9 mg/dL — ABNORMAL HIGH (ref 8.9–10.3)
Chloride: 101 mmol/L (ref 98–111)
Creatinine, Ser: 0.79 mg/dL (ref 0.44–1.00)
GFR, Estimated: >60 mL/min (ref >60)
Glucose, Bld: 119 mg/dL — ABNORMAL HIGH (ref 70–99)
Potassium: 3.8 mmol/L (ref 3.5–5.1)
Sodium: 138 mmol/L (ref 135–145)

## 2021-07-05 LAB — GLUCOSE, CAPILLARY: Glucose-Capillary: 116 mg/dL — ABNORMAL HIGH (ref 70–99)

## 2021-07-05 LAB — HEMOGLOBIN A1C
Hgb A1c MFr Bld: 5.8 % — ABNORMAL HIGH (ref 4.8–5.6)
Mean Plasma Glucose: 119.76 mg/dL

## 2021-07-05 NOTE — Progress Notes (Signed)
PCP - [facility] Cardiologist - none  PPM/ICD - denies Device Orders -  Rep Notified -   Chest x-ray - no EKG - 06/23/21 Stress Test - no ECHO - no Cardiac Cath - no  Sleep Study - denies CPAP - denies  Fasting Blood Sugar - Pt states she does not have a CBG meter and does not check her sugar Checks Blood Sugar __none___ times a day  Blood Thinner Instructions:n/a Aspirin Instructions:n/a  ERAS Protcol -clears until 0910 PRE-SURGERY Ensure or G2- G2  COVID TEST- n/a ambulatory surgery   Anesthesia review: no  Patient denies shortness of breath, fever, cough and chest pain at PAT appointment   All instructions explained to the patient, with a verbal understanding of the material. Patient agrees to go over the instructions while at home for a better understanding. Patient also instructed to self quarantine after being tested for COVID-19. The opportunity to ask questions was provided.

## 2021-07-11 ENCOUNTER — Encounter (HOSPITAL_COMMUNITY)
Admission: RE | Disposition: A | Discharge status: Home / Self Care | Payer: Self-pay | Source: Home / Self Care | Attending: Orthopaedic Surgery

## 2021-07-11 ENCOUNTER — Ambulatory Visit (HOSPITAL_COMMUNITY): Payer: 59 | Admitting: Certified Registered Nurse Anesthetist

## 2021-07-11 ENCOUNTER — Other Ambulatory Visit: Payer: Self-pay

## 2021-07-11 ENCOUNTER — Encounter (HOSPITAL_COMMUNITY): Payer: Self-pay | Admitting: Orthopaedic Surgery

## 2021-07-11 ENCOUNTER — Ambulatory Visit (HOSPITAL_COMMUNITY)
Admission: RE | Admit: 2021-07-11 | Discharge: 2021-07-11 | Disposition: A | Discharge status: Home / Self Care | Payer: 59 | Attending: Orthopaedic Surgery | Admitting: Orthopaedic Surgery

## 2021-07-11 DIAGNOSIS — E119 Type 2 diabetes mellitus without complications: Secondary | ICD-10-CM | POA: Diagnosis not present

## 2021-07-11 DIAGNOSIS — Z6839 Body mass index (BMI) 39.0-39.9, adult: Secondary | ICD-10-CM | POA: Diagnosis not present

## 2021-07-11 DIAGNOSIS — I10 Essential (primary) hypertension: Secondary | ICD-10-CM | POA: Diagnosis not present

## 2021-07-11 DIAGNOSIS — M25312 Other instability, left shoulder: Secondary | ICD-10-CM

## 2021-07-11 DIAGNOSIS — Z87891 Personal history of nicotine dependence: Secondary | ICD-10-CM | POA: Diagnosis not present

## 2021-07-11 DIAGNOSIS — J45909 Unspecified asthma, uncomplicated: Secondary | ICD-10-CM | POA: Insufficient documentation

## 2021-07-11 DIAGNOSIS — W108XXA Fall (on) (from) other stairs and steps, initial encounter: Secondary | ICD-10-CM | POA: Insufficient documentation

## 2021-07-11 DIAGNOSIS — S43432A Superior glenoid labrum lesion of left shoulder, initial encounter: Secondary | ICD-10-CM | POA: Diagnosis not present

## 2021-07-11 HISTORY — PX: SHOULDER ARTHROSCOPY WITH LABRAL REPAIR: SHX5691

## 2021-07-11 LAB — GLUCOSE, CAPILLARY
Glucose-Capillary: 97 mg/dL (ref 70–99)
Glucose-Capillary: 103 mg/dL — ABNORMAL HIGH (ref 70–99)

## 2021-07-11 SURGERY — ARTHROSCOPY, SHOULDER, WITH GLENOID LABRUM REPAIR
Anesthesia: Regional | Site: Shoulder | Laterality: Left

## 2021-07-11 MED ORDER — BUPIVACAINE HCL (PF) 0.5 % IJ SOLN
INTRAMUSCULAR | Status: DC | PRN
  Administered 2021-07-11: 15 mL via PERINEURAL

## 2021-07-11 MED ORDER — ONDANSETRON HCL 4 MG/2ML IJ SOLN
INTRAMUSCULAR | Status: AC
  Filled 2021-07-11: qty 2

## 2021-07-11 MED ORDER — OXYCODONE HCL 5 MG PO TABS: 5.0000 mg | ORAL_TABLET | Freq: Once | ORAL | Status: DC | PRN

## 2021-07-11 MED ORDER — DEXAMETHASONE SODIUM PHOSPHATE 10 MG/ML IJ SOLN
INTRAMUSCULAR | Status: DC | PRN
  Administered 2021-07-11: 4 mg via INTRAVENOUS

## 2021-07-11 MED ORDER — AMISULPRIDE (ANTIEMETIC) 5 MG/2ML IV SOLN: 10.0000 mg | Freq: Once | INTRAVENOUS | Status: DC | PRN

## 2021-07-11 MED ORDER — GABAPENTIN 300 MG PO CAPS
300.0000 mg | ORAL_CAPSULE | Freq: Once | ORAL | Status: AC
  Administered 2021-07-11: 300 mg via ORAL
  Filled 2021-07-11: qty 1

## 2021-07-11 MED ORDER — FENTANYL CITRATE (PF) 100 MCG/2ML IJ SOLN: 25.0000 ug | INTRAMUSCULAR | Status: DC | PRN

## 2021-07-11 MED ORDER — FENTANYL CITRATE (PF) 250 MCG/5ML IJ SOLN
INTRAMUSCULAR | Status: DC | PRN
  Administered 2021-07-11: 50 ug via INTRAVENOUS

## 2021-07-11 MED ORDER — EPINEPHRINE PF 1 MG/ML IJ SOLN
INTRAMUSCULAR | Status: DC | PRN
  Administered 2021-07-11 (×2): 1 mg

## 2021-07-11 MED ORDER — SODIUM CHLORIDE 0.9 % IR SOLN
Status: DC | PRN
  Administered 2021-07-11 (×2): 6000 mL

## 2021-07-11 MED ORDER — LIDOCAINE 2% (20 MG/ML) 5 ML SYRINGE
INTRAMUSCULAR | Status: DC | PRN
  Administered 2021-07-11: 80 mg via INTRAVENOUS

## 2021-07-11 MED ORDER — OXYCODONE HCL 5 MG/5ML PO SOLN: 5.0000 mg | Freq: Once | ORAL | Status: DC | PRN

## 2021-07-11 MED ORDER — CEFAZOLIN SODIUM-DEXTROSE 2-4 GM/100ML-% IV SOLN
2.0000 g | INTRAVENOUS | Status: AC
  Administered 2021-07-11: 2 g via INTRAVENOUS
  Filled 2021-07-11: qty 100

## 2021-07-11 MED ORDER — ACETAMINOPHEN 500 MG PO TABS
1000.0000 mg | ORAL_TABLET | Freq: Once | ORAL | Status: AC
  Administered 2021-07-11: 1000 mg via ORAL
  Filled 2021-07-11: qty 2

## 2021-07-11 MED ORDER — MIDAZOLAM HCL 2 MG/2ML IJ SOLN: 2.0000 mg | Freq: Once | INTRAMUSCULAR | Status: AC

## 2021-07-11 MED ORDER — PROPOFOL 10 MG/ML IV BOLUS
INTRAVENOUS | Status: DC | PRN
  Administered 2021-07-11: 175 mg via INTRAVENOUS

## 2021-07-11 MED ORDER — ORAL CARE MOUTH RINSE: 15.0000 mL | Freq: Once | OROMUCOSAL | Status: AC

## 2021-07-11 MED ORDER — LACTATED RINGERS IV SOLN: INTRAVENOUS | Status: DC | PRN

## 2021-07-11 MED ORDER — LACTATED RINGERS IV SOLN: INTRAVENOUS | Status: DC

## 2021-07-11 MED ORDER — PROPOFOL 10 MG/ML IV BOLUS
INTRAVENOUS | Status: AC
  Filled 2021-07-11: qty 20

## 2021-07-11 MED ORDER — TRANEXAMIC ACID-NACL 1000-0.7 MG/100ML-% IV SOLN
1000.0000 mg | INTRAVENOUS | Status: AC
  Administered 2021-07-11: 1000 mg via INTRAVENOUS

## 2021-07-11 MED ORDER — FENTANYL CITRATE (PF) 100 MCG/2ML IJ SOLN
INTRAMUSCULAR | Status: AC
  Administered 2021-07-11: 50 ug via INTRAVENOUS
  Filled 2021-07-11: qty 2

## 2021-07-11 MED ORDER — PROMETHAZINE HCL 25 MG/ML IJ SOLN: 6.2500 mg | INTRAMUSCULAR | Status: DC | PRN

## 2021-07-11 MED ORDER — 0.9 % SODIUM CHLORIDE (POUR BTL) OPTIME
TOPICAL | Status: DC | PRN
Start: 2021-07-11 — End: 2021-07-11
  Administered 2021-07-11: 1000 mL

## 2021-07-11 MED ORDER — BUPIVACAINE HCL (PF) 0.25 % IJ SOLN
INTRAMUSCULAR | Status: AC
  Filled 2021-07-11: qty 30

## 2021-07-11 MED ORDER — ONDANSETRON HCL 4 MG/2ML IJ SOLN
INTRAMUSCULAR | Status: DC | PRN
  Administered 2021-07-11: 4 mg via INTRAVENOUS

## 2021-07-11 MED ORDER — CHLORHEXIDINE GLUCONATE 0.12 % MT SOLN
15.0000 mL | Freq: Once | OROMUCOSAL | Status: AC
  Administered 2021-07-11: 15 mL via OROMUCOSAL
  Filled 2021-07-11: qty 15

## 2021-07-11 MED ORDER — EPINEPHRINE PF 1 MG/ML IJ SOLN
INTRAMUSCULAR | Status: AC
  Filled 2021-07-11: qty 2

## 2021-07-11 MED ORDER — FENTANYL CITRATE (PF) 250 MCG/5ML IJ SOLN
INTRAMUSCULAR | Status: AC
  Filled 2021-07-11: qty 5

## 2021-07-11 MED ORDER — PHENYLEPHRINE HCL-NACL 20-0.9 MG/250ML-% IV SOLN
INTRAVENOUS | Status: DC | PRN
  Administered 2021-07-11: 30 ug/min via INTRAVENOUS

## 2021-07-11 MED ORDER — MIDAZOLAM HCL 2 MG/2ML IJ SOLN
INTRAMUSCULAR | Status: AC
  Administered 2021-07-11: 2 mg via INTRAVENOUS
  Filled 2021-07-11: qty 2

## 2021-07-11 MED ORDER — ROCURONIUM BROMIDE 10 MG/ML (PF) SYRINGE
PREFILLED_SYRINGE | INTRAVENOUS | Status: DC | PRN
  Administered 2021-07-11: 80 mg via INTRAVENOUS

## 2021-07-11 MED ORDER — FENTANYL CITRATE (PF) 100 MCG/2ML IJ SOLN: 50.0000 ug | Freq: Once | INTRAMUSCULAR | Status: AC

## 2021-07-11 MED ORDER — BUPIVACAINE LIPOSOME 1.3 % IJ SUSP
INTRAMUSCULAR | Status: DC | PRN
Start: 2021-07-11 — End: 2021-07-11
  Administered 2021-07-11: 10 mL via PERINEURAL

## 2021-07-11 MED ORDER — DEXAMETHASONE SODIUM PHOSPHATE 10 MG/ML IJ SOLN
INTRAMUSCULAR | Status: AC
  Filled 2021-07-11: qty 1

## 2021-07-11 SURGICAL SUPPLY — 58 items
ANCH SUT 2 FBRTK KNTLS 1.8 (Anchor) ×3 IMPLANT
ANCHOR SUT 1.8 FIBERTAK SB KL (Anchor) ×3 IMPLANT
BAG COUNTER SPONGE SURGICOUNT (BAG) ×2 IMPLANT
BAG SPNG CNTER NS LX DISP (BAG) ×1
BLADE EXCALIBUR 4.0X13 (MISCELLANEOUS) ×2 IMPLANT
BLADE SHAVER TORPEDO 4X13 (MISCELLANEOUS) ×3 IMPLANT
CANNULA 5.75X71 LONG (CANNULA) ×3 IMPLANT
CANNULA TWIST IN 8.25X7CM (CANNULA) IMPLANT
COOLER ICEMAN CLASSIC (MISCELLANEOUS) ×1 IMPLANT
COVER SURGICAL LIGHT HANDLE (MISCELLANEOUS) ×2 IMPLANT
DISSECTOR 4.0MM X 13CM (MISCELLANEOUS) IMPLANT
DRAPE INCISE IOBAN 66X45 STRL (DRAPES) ×2 IMPLANT
DRAPE STERI 35X30 U-POUCH (DRAPES) ×3 IMPLANT
DRSG TEGADERM 2-3/8X2-3/4 SM (GAUZE/BANDAGES/DRESSINGS) ×1 IMPLANT
DRSG TEGADERM 4X4.75 (GAUZE/BANDAGES/DRESSINGS) IMPLANT
DW OUTFLOW CASSETTE/TUBE SET (MISCELLANEOUS) ×2 IMPLANT
GAUZE SPONGE 4X4 12PLY STRL (GAUZE/BANDAGES/DRESSINGS) ×2 IMPLANT
GAUZE XEROFORM 1X8 LF (GAUZE/BANDAGES/DRESSINGS) ×1 IMPLANT
GAUZE XEROFORM 5X9 LF (GAUZE/BANDAGES/DRESSINGS) ×1 IMPLANT
GLOVE SRG 8 PF TXTR STRL LF DI (GLOVE) ×1 IMPLANT
GLOVE SURG ENC MOIS LTX SZ6 (GLOVE) ×3 IMPLANT
GLOVE SURG LTX SZ8 (GLOVE) ×1 IMPLANT
GLOVE SURG SYN 7.5  E (GLOVE) ×2
GLOVE SURG SYN 7.5 E (GLOVE) ×1 IMPLANT
GLOVE SURG SYN 7.5 PF PI (GLOVE) ×2 IMPLANT
GLOVE SURG UNDER POLY LF SZ6.5 (GLOVE) ×4 IMPLANT
GLOVE SURG UNDER POLY LF SZ8 (GLOVE) ×2
GOWN STRL REUS W/ TWL LRG LVL3 (GOWN DISPOSABLE) ×2 IMPLANT
GOWN STRL REUS W/TWL LRG LVL3 (GOWN DISPOSABLE) ×4
KIT BASIN OR (CUSTOM PROCEDURE TRAY) ×2 IMPLANT
KIT CVD SPEAR FBRTK 1.8 DRILL (KITS) ×1 IMPLANT
KIT PERC INSERT 3.0 KNTLS (KITS) IMPLANT
KIT STR SPEAR 1.8 FBRTK DISP (KITS) ×2 IMPLANT
KIT TURNOVER KIT B (KITS) ×2 IMPLANT
LASSO 90 CVE QUICKPAS (DISPOSABLE) ×2 IMPLANT
LASSO CRESCENT QUICKPASS (SUTURE) IMPLANT
MANIFOLD NEPTUNE II (INSTRUMENTS) ×2 IMPLANT
NDL SPNL 18GX3.5 QUINCKE PK (NEEDLE) ×1 IMPLANT
NDL SUT 2-0 SCORPION KNEE (NEEDLE) ×1 IMPLANT
NEEDLE SPNL 18GX3.5 QUINCKE PK (NEEDLE) ×2 IMPLANT
NEEDLE SUT 2-0 SCORPION KNEE (NEEDLE) IMPLANT
NS IRRIG 1000ML POUR BTL (IV SOLUTION) ×2 IMPLANT
PACK SHOULDER (CUSTOM PROCEDURE TRAY) ×2 IMPLANT
PAD ABD 8X10 STRL (GAUZE/BANDAGES/DRESSINGS) ×1 IMPLANT
PAD ARMBOARD 7.5X6 YLW CONV (MISCELLANEOUS) ×4 IMPLANT
PORT APPOLLO RF 90DEGREE MULTI (SURGICAL WAND) ×2 IMPLANT
SLEEVE ARM SUSPENSION SYSTEM (MISCELLANEOUS) ×1 IMPLANT
SLING S3 LATERAL DISP (MISCELLANEOUS) ×1 IMPLANT
SPONGE T-LAP 4X18 ~~LOC~~+RFID (SPONGE) ×3 IMPLANT
SUT ETHILON 3 0 PS 1 (SUTURE) ×3 IMPLANT
SUT FIBERWIRE 2-0 18 17.9 3/8 (SUTURE)
SUT LASSO 90 DEG CVD (SUTURE) IMPLANT
SUTURE FIBERWR 2-0 18 17.9 3/8 (SUTURE) IMPLANT
SYR 30ML LL (SYRINGE) ×2 IMPLANT
TOWEL GREEN STERILE (TOWEL DISPOSABLE) ×2 IMPLANT
TOWEL GREEN STERILE FF (TOWEL DISPOSABLE) ×1 IMPLANT
TUBING ARTHROSCOPY IRRIG 16FT (MISCELLANEOUS) ×2 IMPLANT
WATER STERILE IRR 1000ML POUR (IV SOLUTION) ×1 IMPLANT

## 2021-07-11 NOTE — Discharge Instructions (Signed)
° ° °   Discharge Instructions    Attending Surgeon: Mayme Profeta, MD Office Phone Number: [phone]   Diagnosis and Procedures:    Surgeries Performed: Left shoulder labral repair  Discharge Plan:    Diet: Resume usual diet. Begin with light or bland foods.  Drink plenty of fluids.  Activity:  Keep sling and dressing in place until your follow up visit in Physical Therapy You are advised to go home directly from the hospital or surgical center. Restrict your activities.  GENERAL INSTRUCTIONS: 1.  Keep your surgical site elevated above your heart for at least 5-7 days or longer to prevent swelling. This will improve your comfort and your overall recovery following surgery.     2. Please call Dr. Kaliel Bolds's office at [phone] with questions Monday-Friday during business hours. If no one answers, please leave a message and someone should get back to the patient within 24 hours. For emergencies please call 911 or proceed to the emergency room.   3. Patient to notify surgical team if experiences any of the following: Bowel/Bladder dysfunction, uncontrolled pain, nerve/muscle weakness, incision with increased drainage or redness, nausea/vomiting and Fever greater than 101.0 F.  Be alert for signs of infection including redness, streaking, odor, fever or chills. Be alert for excessive pain or bleeding and notify your surgeon immediately.  WOUND INSTRUCTIONS:   Leave your dressing/cast/splint in place until your post operative visit.  Keep it clean and dry.  Always keep the incision clean and dry until the staples/sutures are removed. If there is no drainage from the incision you should keep it open to air. If there is drainage from the incision you must keep it covered at all times until the drainage stops  Do not soak in a bath tub, hot tub, pool, lake or other body of water until 21 days after your surgery and your incision is completely dry and healed.  If you have removable  sutures (or staples) they must be removed 10-14 days (unless otherwise instructed) from the day of your surgery.     1)  Elevate the extremity as much as possible.  2)  Keep the dressing clean and dry.  3)  Please call us if the dressing becomes wet or dirty.  4)  If you are experiencing worsening pain or worsening swelling, please call.     MEDICATIONS: Resume all previous home medications at the previous prescribed dose and frequency unless otherwise noted Start taking the  pain medications on an as-needed basis as prescribed  Please taper down pain medication over the next week following surgery.  Ideally you should not require a refill of any narcotic pain medication.  Take pain medication with food to minimize nausea. In addition to the prescribed pain medication, you may take over-the-counter pain relievers such as Tylenol.  Do NOT take additional tylenol if your pain medication already has tylenol in it.  Aspirin 325mg daily for four weeks.      FOLLOWUP INSTRUCTIONS: 1. Follow up at the Physical Therapy Clinic 3-4 days following surgery. This appointment should be scheduled unless other arrangements have been made.The Physical Therapy scheduling number is [phone] if an appointment has not already been arranged.  2. Contact Dr. Krishawna Stiefel's office during office hours at [phone] or the practice after hours line at [phone] for non-emergencies. For medical emergencies call 911.   Discharge Location: Home  

## 2021-07-11 NOTE — Anesthesia Preprocedure Evaluation (Signed)
Anesthesia Evaluation  Patient identified by MRN, date of birth, ID band Patient awake    Reviewed: Allergy & Precautions, NPO status , Patient's Chart, lab work & pertinent test results  Airway Mallampati: III  TM Distance: >3 FB Neck ROM: Full    Dental no notable dental hx.    Pulmonary asthma , former smoker,    Pulmonary exam normal breath sounds clear to auscultation       Cardiovascular Exercise Tolerance: Good METS: 3 - Mets hypertension, Pt. on medications Normal cardiovascular exam Rhythm:Regular Rate:Normal  EKG NSR   Neuro/Psych negative neurological ROS  negative psych ROS   GI/Hepatic negative GI ROS, Neg liver ROS,   Endo/Other  diabetes, Type 2, Oral Hypoglycemic AgentsMorbid obesity  Renal/GU negative Renal ROS  negative genitourinary   Musculoskeletal negative musculoskeletal ROS (+)   Abdominal   Peds negative pediatric ROS (+)  Hematology negative hematology ROS (+)   Anesthesia Other Findings   Reproductive/Obstetrics negative OB ROS                             Anesthesia Physical Anesthesia Plan  ASA: 3  Anesthesia Plan: General and Regional   Post-op Pain Management:    Induction: Intravenous  PONV Risk Score and Plan: Treatment may vary due to age or medical condition, Midazolam, Ondansetron, Dexamethasone and Scopolamine patch - Pre-op  Airway Management Planned: Oral ETT  Additional Equipment:   Intra-op Plan:   Post-operative Plan: Extubation in OR  Informed Consent: I have reviewed the patients History and Physical, chart, labs and discussed the procedure including the risks, benefits and alternatives for the proposed anesthesia with the patient or authorized representative who has indicated his/her understanding and acceptance.     Dental advisory given  Plan Discussed with: CRNA  Anesthesia Plan Comments: (GETA. Interscalene block with  Exparel. Norton Blizzard, MD  )        Anesthesia Quick Evaluation

## 2021-07-11 NOTE — Anesthesia Procedure Notes (Signed)
Anesthesia Regional Block: Interscalene brachial plexus block   Pre-Anesthetic Checklist: , timeout performed,  Correct Patient, Correct Site, Correct Laterality,  Correct Procedure, Correct Position, site marked,  Risks and benefits discussed,  Surgical consent,  Pre-op evaluation,  At surgeon's request and post-op pain management  Laterality: Left  Prep: chloraprep       Needles:  Injection technique: Single-shot  Needle Type: Echogenic Stimulator Needle     Needle Length: 10cm  Needle Gauge: 20     Additional Needles:   Procedures:,,,, ultrasound used (permanent image in chart),,    Narrative:  Start time: 07/11/2021 11:05 AM End time: 07/11/2021 11:10 AM Injection made incrementally with aspirations every 5 mL.  Performed by: Personally  Anesthesiologist: Mellody Dance, MD  Additional Notes: Standard monitors applied. Skin prepped. Good needle visualization with ultrasound. Injection made in 5cc increments with no resistance to injection. Patient tolerated the procedure well.

## 2021-07-11 NOTE — Anesthesia Procedure Notes (Signed)
Procedure Name: Intubation Date/Time: 07/11/2021 12:18 PM Performed by: Leonor Liv, CRNA Pre-anesthesia Checklist: Patient identified, Emergency Drugs available, Suction available and Patient being monitored Patient Re-evaluated:Patient Re-evaluated prior to induction Oxygen Delivery Method: Circle System Utilized Preoxygenation: Pre-oxygenation with 100% oxygen Induction Type: IV induction Ventilation: Mask ventilation without difficulty Laryngoscope Size: Mac and 4 Grade View: Grade I Tube type: Oral Tube size: 7.5 mm Number of attempts: 1 Airway Equipment and Method: Stylet and Oral airway Placement Confirmation: ETT inserted through vocal cords under direct vision, positive ETCO2 and breath sounds checked- equal and bilateral Secured at: 21 cm Tube secured with: Tape Dental Injury: Teeth and Oropharynx as per pre-operative assessment  Comments: Intubation performed by Melven Sartorius

## 2021-07-11 NOTE — Interval H&P Note (Signed)
History and Physical Interval Note:  07/11/2021 11:08 AM  Judith Pollard  has presented today for surgery, with the diagnosis of Left shoulder instability.  The various methods of treatment have been discussed with the patient and family. After consideration of risks, benefits and other options for treatment, the patient has consented to  Procedure(s): Left SHOULDER ARTHROSCOPY WITH LABRAL REPAIR (Left) as a surgical intervention.  The patient's history has been reviewed, patient examined, no change in status, stable for surgery.  I have reviewed the patient's chart and labs.  Questions were answered to the patient's satisfaction.     Vanetta Mulders

## 2021-07-11 NOTE — H&P (Signed)
Chief Complaint: left shoulder pain, left wrist pain        History of Present Illness:    06/10/2021: Presents today with continued left shoulder pain.  She is having significant difficulty with any type of overhead activity.  The shoulder does not feel stable.  She has been working with physical therapy although this is extremely limited due to the ongoing pain in the shoulder.   Judith Pollard is a 56 y.o. female with left shoulder and left wrist pain after a fall the week prior.  This occurred [place].  She fell down several steps.  Since that time she has had significant pain and weakness in the shoulder.  She initially presented to the emergency room was placed in a sling.  She has limited range of motion of the shoulder.  She states that the left palm pain is improving.  She works as a Estate agent.  She is right-hand dominant.  She has taken NSAIDs as well as Vicodin which helps somewhat.  She is remained in a sling with limited motion       Surgical History:   None   PMH/PSH/Family History/Social History/Meds/Allergies:         Past Medical History:  Diagnosis Date   Diabetes mellitus without complication (HCC)     Hypertension      No past surgical history on file. Social History         Socioeconomic History   Marital status: Married      Spouse name: Not on file   Number of children: Not on file   Years of education: Not on file   Highest education level: Not on file  Occupational History   Not on file  Tobacco Use   Smoking status: Former      Types: Cigarettes      Quit date: 12/16/2018      Years since quitting: 2.4   Smokeless tobacco: Never  Vaping Use   Vaping Use: Never used  Substance and Sexual Activity   Alcohol use: Yes      Comment: social   Drug use: Never   Sexual activity: Not Currently  Other Topics Concern   Not on file  Social History Narrative   Not on  file    Social Determinants of Health    Financial Resource Strain: Not on file  Food Insecurity: Not on file  Transportation Needs: Not on file  Physical Activity: Not on file  Stress: Not on file  Social Connections: Not on file    Family History  Family history unknown: Yes    No Known Allergies       Current Outpatient Medications  Medication Sig Dispense Refill   Albuterol Sulfate (PROAIR RESPICLICK) 108 (90 Base) MCG/ACT AEPB Inhale 2 puffs into the lungs every 6 (six) hours as needed. 1 each 4   cetirizine (ZYRTEC) 10 MG tablet Take 1 tablet (10 mg total) by mouth daily. 30 tablet 11   fluticasone (FLOVENT HFA) 44 MCG/ACT inhaler Inhale 2 puffs into the lungs in the morning and at bedtime. 1 Inhaler 12   HYDROcodone-acetaminophen (NORCO/VICODIN) 5-325 MG tablet Take 1 tablet by mouth every 6 (six) hours as needed. 10 tablet 0   losartan-hydrochlorothiazide (HYZAAR) 100-12.5 MG tablet Take 1 tablet by mouth daily. 30 tablet 3   metFORMIN (GLUCOPHAGE-XR) 500 MG 24 hr tablet Take 1 tablet (500 mg total) by mouth daily  with breakfast. 90 tablet 1   methylPREDNISolone (MEDROL DOSEPAK) 4 MG TBPK tablet Take per packet instructions 21 each 0    No current facility-administered medications for this visit.    Imaging Results (Last 48 hours)  No results found.     Review of Systems:   A ROS was performed including pertinent positives and negatives as documented in the HPI.   Physical Exam :   Constitutional: NAD and appears stated age Neurological: Alert and oriented Psych: Appropriate affect and cooperative Last menstrual period 05/19/2013.    Comprehensive Musculoskeletal Exam:     Musculoskeletal Exam      Inspection Right Left  Skin No atrophy or winging No atrophy or winging  Palpation      Tenderness None Lateral deltoid  Range of Motion      Flexion (passive) 170 90  Flexion (active) 170 80  Abduction 170 80  ER at the side 70 15  Can reach behind back to T12  Front pocket  Strength        Normal 4/5 supra and infraspinatus with pain  Special Tests      Pseudoparalytic No No  Neurologic      Fires PIN, radial, median, ulnar, musculocutaneous, axillary, suprascapular, long thoracic, and spinal accessory innervated muscles. No abnormal sensibility  Vascular/Lymphatic      Radial Pulse 2+ 2+  Cervical Exam      Patient has symmetric cervical range of motion with negative Spurling's test.  Special Test: 2+ anterior load shift with pain        Imaging:   Xray (3 views left shoulder, 3 views left wrist): There appears to be inferior Bankart bony loss with regard to the left shoulder otherwise normal   Left wrist is normal     MRI left shoulder: There is a significant labral tear with bony Bankart lesion as well as a large Hill-Sachs.  Rotator cuff tendon is intact     I personally reviewed and interpreted the radiographs.     Assessment:   56 year old female with left shoulder pain after a fall directly on the side.  MRI reveals evidence of anterior shoulder dislocation with ongoing shoulder instability.  While this is her first episode, I did advise that ultimately given the large amount of approximately 20% anterior glenoid bone loss in addition to the Hill-Sachs lesion, I do believe that her outcome would be likely to be average to poor with ongoing physical therapy.  We also described that therapy is not significantly improving her quality of her shoulder and it has been over a month at this point.  Given the fact that this is plateaued I have advised that I do believe that surgical arthroscopy of the shoulder with labral repair and remplissage is indicated in order to optimize her long-term outcome.  She would like to proceed with this. Plan :     -Plan for left shoulder arthroscopy with labral repair and remplissage     Patient was prescribed a shoulder immobilizer today for the diagnosis listed above under assessment. The patient  requires stabilization from this orthosis in their left arm.         After a lengthy discussion of treatment options, including risks, benefits, alternatives, complications of surgical and nonsurgical conservative options, the patient elected surgical repair.    The patient  is aware of the material risks  and complications including, but not limited to injury to adjacent structures, neurovascular injury, infection, numbness, bleeding, implant failure, thermal  burns, stiffness, persistent pain, failure to heal, disease transmission from allograft, need for further surgery, dislocation, anesthetic risks, blood clots, risks of death,and others. The probabilities of surgical success and failure discussed with patient given their particular co-morbidities.The time and nature of expected rehabilitation and recovery was discussed.The patient's questions were all answered preoperatively.  No barriers to understanding were noted. I explained the natural history of the disease process and Rx rationale.  I explained to the patient what I considered to be reasonable expectations given their personal situation.  The final treatment plan was arrived at through a shared patient decision making process model.           I personally saw and evaluated the patient, and participated in the management and treatment plan.   Huel Cote, MD Attending Physician, Orthopedic Surgery

## 2021-07-11 NOTE — Brief Op Note (Signed)
° °  Brief Op Note  Date of Surgery: 07/11/2021  Preoperative Diagnosis: Left shoulder instability  Postoperative Diagnosis: same  Procedure: Procedure(s): Left SHOULDER ARTHROSCOPY WITH LABRAL REPAIR  Implants: Implant Name Type Inv. Item Serial No. Manufacturer Lot No. LRB No. Used Action  ANCHOR SUT 1.8 [employer]IB[employer] SB KL - [id number] Anchor ANCHOR SUT 1.8 [employer]IBERTAK SB KL  [employer] [id number] Left 1 Implanted  ANCHOR SUT 1.8 FIBERTAK SB KL - [id number] Anchor ANCHOR SUT 1.8 [employer] SB KL  [employer] [id number] Left 1 Implanted  ANCHOR SUT 1.8 FIBERTAK SB KL - [id number] Anchor ANCHOR SUT 1.8 [employer] [id number] Left 1 Implanted    Surgeons: Surgeon(s): Vanetta Mulders, MD  Anesthesia: Regional    Estimated Blood Loss: See anesthesia record  Complications: None  Condition to PACU: Stable  Yevonne Pax, MD 07/11/2021 2:06 PM

## 2021-07-11 NOTE — Transfer of Care (Signed)
Immediate Anesthesia Transfer of Care Note  Patient: Judith Pollard  Procedure(s) Performed: Left SHOULDER ARTHROSCOPY WITH LABRAL REPAIR (Left: Shoulder)  Patient Location: PACU  Anesthesia Type:General  Level of Consciousness: awake, oriented and drowsy  Airway & Oxygen Therapy: Patient connected to nasal cannula oxygen  Post-op Assessment: Report given to RN and Post -op Vital signs reviewed and stable  Post vital signs: stable  Last Vitals:  Vitals Value Taken Time  BP 142/98 07/11/21 1415  Temp    Pulse 97 07/11/21 1415  Resp 19 07/11/21 1415  SpO2 99 % 07/11/21 1415  Vitals shown include unvalidated device data.  Last Pain:  Vitals:   07/11/21 1032  TempSrc:   PainSc: 0-No pain         Complications: No notable events documented.

## 2021-07-11 NOTE — Op Note (Signed)
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  The risk and benefits of the procedure with discussed in detail and documented in the pre-operative evaluation.  PREOPERATIVE DIAGNOSIS: 1.  Left shoulder instability  POSTOPERATIVE DIAGNOSIS: Same.  PROCEDURE: 1.  Left shoulder labral repair 2.  Left shoulder debridement extensive  SURGEON: Benancio Deeds MD  ASSISTANT: Kerby Less, ATC; necessary for the timely completion of procedure and due to complexity of procedure.  ANESTHESIA:  general  IV FLUIDS AND URINE: See anesthesia record.  ANTIBIOTICS: Ancef 2 g  ESTIMATED BLOOD LOSS: 10 mL.  IMPLANTS:  Implant Name Type Inv. Item Serial No. Manufacturer Lot No. LRB No. Used Action  ANCHOR SUT 1.8 FIBERTAK SB KL - [id number] Anchor ANCHOR SUT 1.8 FIBERTAK SB KL  [employer] [id number] Left 1 Implanted  ANCHOR SUT 1.8 FIBERTAK SB KL - [id number] Anchor ANCHOR SUT 1.8 [employer]IBERTAK SB KL  [employer] [id number] Left 1 Implanted  ANCHOR SUT 1.8 FIBERTAK SB KL - [id number] Anchor ANCHOR SUT 1.8 [employer]IBERTAK SB KL  [employer] [id number] Left 1 Implanted    DRAINS: None  CULTURES: None  COMPLICATIONS: none  DESCRIPTION OF PROCEDURE:  Examination under anesthesia revealed forward elevation of 150 degrees.  In abduction, there was 60 degrees of external rotation and 45 degrees of internal rotation.  With the arm at the side, there was 45 degrees of external rotation.  There is a 2+ anterior load shift and a 1+ posterior load shift.  8 mm of inferior humeral head translation in internal rotation, 8 mm in neutral rotation, 8 mm in external rotation.   Arthroscopic findings demonstrated:  Glenoid cartilage: Normal Humeral head: Normal Labrum: Tear from the 6:00 to 2:30 position Biceps insertion: Intact Biceps tendon: Hemorrhagic Subscapularis insertion: Intact Rotator cuff: Intact  Identified  in the preoperative holding area.  The site was marked according to universal protocol with nursing.  Anesthesia performed a peripheral interscalene block.  She is subsequently taken back to the operating room.  She was prepped and draped in the lateral position.  Care was taken to to pad the bony prominences including peroneal nerve.  Again final timeout performed.  We began with standard posterior, anterior and anterosuperolateral portals and diagnostic arthroscopy ensued with the above findings. The posterior portal was created with an 11-blade and the arthroscope introduced into the glenohumeral joint.  A full diagnostic arthroscopy was performed as described above.  A low anterior portal just above the rolled border of the subscapularis was identified with a spinal needle, and an anterior lateral portal was placed for instrumentation.   First, I directed my attention to preparation of the glenoid, labrum and capsule for repair.  This included shaving from the 230 to  6 o'clock position and elevation with an arthroscopic elevator.  The torpedo shaver was then used to prepare the bony surface to repair the labrum back down.  Next, I sequentially repaired the capsule and labrum from the 230 position to the 6 position with a total of 2 anchors.  These were knotless one-point 8 suture tack anchors.  Anchors were placed at the 4 and 530 positions.  At each location, a pilot hole was drilled, the anchor inserted and deployed.  Then, one limb of suture was shuttled around the labrum and capsule using a 90 degree suture lasso device.  This was fed into the knotless device and subsequently tightened.  Once completed,  the labrum was restored to an anatomic position, and tension was restored to both bands of the IGHL.  The inferior capsular volume was normalized and the humeral head was centered on the glenoid.  At this time arthroscopic torpedo as well as a radiofrequency ablation tool was introduced to the shaver  and debridement was performed with a combination of these tools in the rotator interval as well as fraying involving the superior and posterior labrum.   All instruments were removed, fluid was evacuated, and the arthroscopy portals were closed with 3-0 nylon A sterile dressing was applied followed by a Iceman device and a sling with an abduction pillow.    The patient awoke from anesthesia without difficulty and was transferred to PACU in stable condition.      Kerby Less ATC was necessary for opening, closing, retracting, limb positioning and overall facilitation and timely completion of the procedure.     POSTOPERATIVE PLAN: She will be nonweightbearing in a sling in the left upper extremity.  I will see her back in 2 weeks for suture removal.  She will begin physical therapy immediately postop for range of motion.  Benancio Deeds, MD 2:06 PM

## 2021-07-12 ENCOUNTER — Encounter (HOSPITAL_COMMUNITY): Payer: Self-pay | Admitting: Orthopaedic Surgery

## 2021-07-12 NOTE — Anesthesia Postprocedure Evaluation (Signed)
Anesthesia Post Note  Patient: Judith Pollard  Procedure(s) Performed: Left SHOULDER ARTHROSCOPY WITH LABRAL REPAIR (Left: Shoulder)     Patient location during evaluation: PACU Anesthesia Type: Regional and General Level of consciousness: awake Pain management: pain level controlled Vital Signs Assessment: post-procedure vital signs reviewed and stable Respiratory status: spontaneous breathing and respiratory function stable Cardiovascular status: stable Postop Assessment: no apparent nausea or vomiting Anesthetic complications: no   No notable events documented.  Last Vitals:  Vitals:   07/11/21 1430 07/11/21 1445  BP: (!) 125/91 (!) 139/94  Pulse: 92 83  Resp: 20 18  Temp:  36.6 C  SpO2: 94% 94%    Last Pain:  Vitals:   07/11/21 1445  TempSrc:   PainSc: 0-No pain                 Mellody Dance

## 2021-07-13 NOTE — Therapy (Signed)
OUTPATIENT PHYSICAL THERAPY SHOULDER EVALUATION   Patient Name: Judith Pollard MRN: [id number] DOB:26-Oct-1965, 56 y.o., female Today's Date: 07/14/2021   PT End of Session - 07/14/21 0859     Visit Number 1    Number of Visits 25    Date for PT Re-Evaluation 10/07/21    Authorization Type [employer]    PT Start Time [phone]   pt arrived late   PT Stop Time 0935    PT Time Calculation (min) 39 min    Activity Tolerance Patient tolerated treatment well    Behavior During Therapy WFL for tasks assessed/performed             Past Medical History:  Diagnosis Date   Diabetes mellitus without complication (HCC)    Hypertension    Past Surgical History:  Procedure Laterality Date   FRACTURE SURGERY     rt ring finger   SHOULDER ARTHROSCOPY WITH LABRAL REPAIR Left 07/11/2021   Procedure: Left SHOULDER ARTHROSCOPY WITH LABRAL REPAIR;  Surgeon: Huel Cote, MD;  Location: [facility] OR;  Service: Orthopedics;  Laterality: Left;   Patient Active Problem List   Diagnosis Date Noted   Instability of left shoulder joint    Asthma, cough variant 08/05/2019   Prediabetes 06/27/2019   Essential hypertension 06/26/2019   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/26/2019   Non-seasonal allergic rhinitis 06/26/2019   Morbid obesity (HCC) 06/26/2019   Hyperglycemia 06/26/2019    PCP: Georganna Skeans, MD  REFERRING PROVIDER: Huel Cote, MD  REFERRING DIAG: S/p Lt shoulder labral repair  THERAPY DIAG:  Acute pain of left shoulder  Muscle weakness (generalized)   ONSET DATE: 07/11/21 DOS  SUBJECTIVE:                                                                                                                                                                                      SUBJECTIVE STATEMENT: It hurts, I have not been wearing the sling because it was not fit to me  PERTINENT HISTORY: Pre-DM per pt  PAIN:  Are you having pain? Yes NPRS scale: 8/10 Pain location: Lt shoulder Pain  description: aching  Aggravating factors: constant Relieving factors: ice, pain meds  PRECAUTIONS: Shoulder  WEIGHT BEARING RESTRICTIONS Yes NWB Lt UE  FALLS:  Has patient fallen in last 6 months? 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, Arboriculturist  PLOF: Independent  PATIENT GOALS .decrease pain, return to work, get back to 90-100%  OBJECTIVE:   PATIENT SURVEYS:  FOTO 42  COGNITION:  Overall cognitive status: Within  functional limits for tasks assessed     SENSATION:  WFL  POSTURE: Forward rounded shoulder as expected in sling  PALPATION: Incisions appear well healing with no s/s of infection  UPPER EXTREMITY AROM/PROM:  not appropriate to test at eval  A/PROM Right 07/14/2021 Left 07/14/2021  Shoulder flexion    Shoulder extension    Shoulder abduction    Shoulder adduction    Shoulder internal rotation    Shoulder external rotation    Elbow flexion    Elbow extension    Wrist flexion    Wrist extension    Wrist ulnar deviation    Wrist radial deviation    Wrist pronation    Wrist supination    (Blank rows = not tested)  UPPER EXTREMITY MMT: not appropriate to test at eval  MMT Right 07/14/2021 Left 07/14/2021  Shoulder flexion    Shoulder extension    Shoulder abduction    Shoulder adduction    Shoulder internal rotation    Shoulder external rotation    Middle trapezius    Lower trapezius    Elbow flexion    Elbow extension    Wrist flexion    Wrist extension    Wrist ulnar deviation    Wrist radial deviation    Wrist pronation    Wrist supination    Grip strength (lbs)    (Blank rows = not tested)       TODAY'S TREATMENT:  EVAL: PT changed bandages & fit sling with education on donning/doffing Wrist AROM Elbow AAROM & resting stretch Upper trap & levator stretch   PATIENT  EDUCATION: Education details: Teacher, music of condition, POC, HEP, exercise form/rationale  Person educated: Patient Education method: Explanation, Demonstration, Tactile cues, Verbal cues, and Handouts Education comprehension: verbalized understanding, returned demonstration, verbal cues required, tactile cues required, and needs further education   HOME EXERCISE PROGRAM: [id number]  ASSESSMENT:  CLINICAL IMPRESSION: Patient is a 56 y.o. F who was seen today for physical therapy evaluation and treatment for Lt shoulder labral repair on 07/11/21. 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   Objective impairments include decreased activity tolerance, decreased mobility, decreased ROM, decreased strength, increased edema, increased muscle spasms, impaired flexibility, impaired sensation, impaired UE functional use, improper body mechanics, and pain. These impairments are limiting patient from cleaning, community activity, meal prep, occupation, laundry, and shopping. Personal factors including DM, 1 comorbidity: pre-DM  are also affecting patient's functional outcome. Patient will benefit from skilled PT to address above impairments and improve overall function.  REHAB POTENTIAL: Good  CLINICAL DECISION MAKING: Stable/uncomplicated  EVALUATION COMPLEXITY: Low   GOALS: Goals reviewed with patient? Yes  SHORT TERM GOALS:  STG Name Target Date Goal status  1 Comfortable passive flexion to 90 deg  Baseline: not tested at eval 08/05/2021 INITIAL  2 Good periscapular activation with shoulder at neutral Baseline: will progress as appropriate 08/05/2021 INITIAL  3 Independent with donning/doffing sling Baseline:educated at eval 07/21/21  INITIAL  4 Independent with HEP as it has been developed in short term Baseline:will progress as appropriate 08/05/2021 INITIAL                  LONG TERM GOALS:    LTG Name Target Date Goal status  1 Good form in reaching Overhead Baseline:unable at eval 10/07/2021 INITIAL  2 Shoulder strength in dynamometry testing 90% of opposite arm Baseline: 10/07/2021  INITIAL  3 Pt will verbalize readiness to return to work Baseline:will progress as appropriate 10/07/2021 INITIAL  4 FOTO to meet goal Baseline: will evaluate as PT progresses 10/07/2021 INITIAL  5 Independent in long term HEP for continued strengthening Baseline: 10/07/2021 INITIAL             PLAN: PT FREQUENCY: 1-2x/week  PT DURATION: 12 weeks  PLANNED INTERVENTIONS: Therapeutic exercises, Therapeutic activity, Neuro Muscular re-education, Patient/Family education, Joint mobilization, Aquatic Therapy, Dry Needling, Electrical stimulation, Spinal mobilization, Cryotherapy, Moist heat, Taping, Vasopneumatic device, and Manual therapy  PLAN FOR NEXT SESSION: PROM and check HEP  Caleel Kiner C. Nixon Sparr PT, DPT 07/15/21 10:03 AM

## 2021-07-14 ENCOUNTER — Ambulatory Visit (HOSPITAL_BASED_OUTPATIENT_CLINIC_OR_DEPARTMENT_OTHER): Payer: 59 | Attending: Orthopaedic Surgery | Admitting: Physical Therapy

## 2021-07-14 ENCOUNTER — Other Ambulatory Visit: Payer: Self-pay

## 2021-07-14 ENCOUNTER — Encounter (HOSPITAL_BASED_OUTPATIENT_CLINIC_OR_DEPARTMENT_OTHER): Payer: Self-pay | Admitting: Physical Therapy

## 2021-07-14 DIAGNOSIS — M6281 Muscle weakness (generalized): Secondary | ICD-10-CM | POA: Insufficient documentation

## 2021-07-14 DIAGNOSIS — M25612 Stiffness of left shoulder, not elsewhere classified: Secondary | ICD-10-CM | POA: Insufficient documentation

## 2021-07-14 DIAGNOSIS — M25512 Pain in left shoulder: Secondary | ICD-10-CM | POA: Insufficient documentation

## 2021-07-14 DIAGNOSIS — M25312 Other instability, left shoulder: Secondary | ICD-10-CM | POA: Diagnosis present

## 2021-07-18 ENCOUNTER — Other Ambulatory Visit (HOSPITAL_BASED_OUTPATIENT_CLINIC_OR_DEPARTMENT_OTHER): Payer: Self-pay | Admitting: Orthopaedic Surgery

## 2021-07-18 MED ORDER — HYDROMORPHONE HCL 2 MG PO TABS: 2.0000 mg | ORAL_TABLET | ORAL | 0 refills | Status: AC | PRN

## 2021-07-18 MED ORDER — MELATONIN 1 MG PO CAPS: 1.0000 | ORAL_CAPSULE | Freq: Every evening | ORAL | 0 refills | Status: AC

## 2021-07-19 ENCOUNTER — Other Ambulatory Visit: Payer: Self-pay | Admitting: Family Medicine

## 2021-07-21 ENCOUNTER — Other Ambulatory Visit: Payer: Self-pay

## 2021-07-21 ENCOUNTER — Ambulatory Visit (HOSPITAL_BASED_OUTPATIENT_CLINIC_OR_DEPARTMENT_OTHER): Payer: 59 | Admitting: Physical Therapy

## 2021-07-21 DIAGNOSIS — M6281 Muscle weakness (generalized): Secondary | ICD-10-CM

## 2021-07-21 DIAGNOSIS — M25512 Pain in left shoulder: Secondary | ICD-10-CM

## 2021-07-21 DIAGNOSIS — M25612 Stiffness of left shoulder, not elsewhere classified: Secondary | ICD-10-CM

## 2021-07-21 NOTE — Therapy (Signed)
OUTPATIENT PHYSICAL THERAPY TREATMENT NOTE   Patient Name: Judith Pollard MRN: [id number] DOB:04/27/1966, 56 y.o., female Today's Date: 07/21/2021  PCP: Judith Skeans, MD REFERRING PROVIDER: Georganna Skeans, MD   PT End of Session - 07/21/21 1532     Visit Number 2    Number of Visits 25    Date for PT Re-Evaluation 10/07/21    Authorization Type [employer]    PT Start Time 1454    PT Stop Time 1540    PT Time Calculation (min) 46 min    Activity Tolerance Patient tolerated treatment well    Behavior During Therapy [facility] for tasks assessed/performed             Past Medical History:  Diagnosis Date   Diabetes mellitus without complication (HCC)    Hypertension    Past Surgical History:  Procedure Laterality Date   FRACTURE SURGERY     rt ring finger   SHOULDER ARTHROSCOPY WITH LABRAL REPAIR Left 07/11/2021   Procedure: Left SHOULDER ARTHROSCOPY WITH LABRAL REPAIR;  Surgeon: Huel Cote, MD;  Location: [facility] OR;  Service: Orthopedics;  Laterality: Left;   Patient Active Problem List   Diagnosis Date Noted   Instability of left shoulder joint    Asthma, cough variant 08/05/2019   Prediabetes 06/27/2019   Essential hypertension 06/26/2019   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/26/2019   Non-seasonal allergic rhinitis 06/26/2019   Morbid obesity (HCC) 06/26/2019   Hyperglycemia 06/26/2019    REFERRING DIAG: s/p Lt shoulder labral repair  THERAPY DIAG:  Acute pain of left shoulder  Muscle weakness (generalized)  Stiffness of left shoulder, not elsewhere classified  Left shoulder pain, unspecified chronicity  PERTINENT HISTORY: pre DM- per pt  PRECAUTIONS: shoulder, DOs 07/11/21  SUBJECTIVE: it has calmed down a lot but the blood popped the tegaderm.   PAIN:  Are you having pain? Yes NPRS scale: 6/10 Pain location: Lt shoulder and neck  Pain description: aching  Aggravating factors: constant ache Relieving factors: meds   OBJECTIVE:    PATIENT SURVEYS:  FOTO  42   COGNITION:          Overall cognitive status: Within functional limits for tasks assessed                               SENSATION:          WFL   POSTURE: Forward rounded shoulder as expected in sling   PALPATION: Incisions appear well healing with no s/s of infection   UPPER EXTREMITY AROM/PROM:  not appropriate to test at eval   A/PROM Right 07/14/2021 Left 07/14/2021  Shoulder flexion   Passive 30 deg   Shoulder extension      Shoulder abduction      Shoulder adduction      Shoulder internal rotation      Shoulder external rotation      Elbow flexion      Elbow extension      Wrist flexion      Wrist extension      Wrist ulnar deviation      Wrist radial deviation      Wrist pronation      Wrist supination      (Blank rows = not tested)   UPPER EXTREMITY MMT: not appropriate to test at eval   MMT Right 07/14/2021 Left 07/14/2021  Shoulder flexion      Shoulder extension  Shoulder abduction      Shoulder adduction      Shoulder internal rotation      Shoulder external rotation      Middle trapezius      Lower trapezius      Elbow flexion      Elbow extension      Wrist flexion      Wrist extension      Wrist ulnar deviation      Wrist radial deviation      Wrist pronation      Wrist supination      Grip strength (lbs)      (Blank rows = not tested)                    TODAY'S TREATMENT:  2/16: Changed bandages and replaced with gauze and tegaderm PROM to shoulder STM & lateral mobilizations to cervical spine, trigger point release Lt upper trap, manual cervical distraction, Lt first rib depression   EVAL: PT changed bandages & fit sling with education on donning/doffing Wrist AROM Elbow AAROM & resting stretch Upper trap & levator stretch     PATIENT EDUCATION: Education details: Anatomy of condition, POC, HEP, exercise form/rationale   Person educated: Patient Education method: Programmer, multimedia, Demonstration, Tactile cues, Verbal cues,  and Handouts Education comprehension: verbalized understanding, returned demonstration, verbal cues required, tactile cues required, and needs further education     HOME EXERCISE PROGRAM: [id number]   ASSESSMENT:   CLINICAL IMPRESSION: Pt replaced bandage on her own due to significant bleeding causing tegaderm to come off that was placed at eval. Assistance from [facility] to remove gauze that was adhered to stitches. Pt is guarded during PROM and reported end range pain at approx 30 degrees of forward flexion. Limited cervical mobility in Rt to left mobilizations. Will consider increasing visit frequency to 2/week if ROM cont to be so limited.   EVAL: Patient is a 56 y.o. F who was seen for physical therapy evaluation and treatment for Lt shoulder labral repair on 07/11/21. 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    Objective impairments include decreased activity tolerance, decreased mobility, decreased ROM, decreased strength, increased edema, increased muscle spasms, impaired flexibility, impaired sensation, impaired UE functional use, improper body mechanics, and pain. These impairments are limiting patient from cleaning, community activity, meal prep, occupation, laundry, and shopping. Personal factors including DM, 1 comorbidity: pre-DM  are also affecting patient's functional outcome. Patient will benefit from skilled PT to address above impairments and improve overall function.   REHAB POTENTIAL: Good   CLINICAL DECISION MAKING: Stable/uncomplicated   EVALUATION COMPLEXITY: Low     GOALS: Goals reviewed with patient? Yes   SHORT TERM GOALS:   STG Name Target Date Goal status  1 Comfortable passive flexion to 90 deg  Baseline: not tested at eval 08/05/2021 INITIAL  2 Good periscapular activation with shoulder at neutral Baseline: will progress as appropriate 08/05/2021 INITIAL  3  Independent with donning/doffing sling Baseline:educated at eval 07/21/21   achieved  4 Independent with HEP as it has been developed in short term Baseline:will progress as appropriate 08/05/2021 INITIAL                               LONG TERM GOALS:    LTG Name Target Date Goal status  1 Good form in reaching Overhead Baseline:unable at eval 10/07/2021 INITIAL  2 Shoulder strength in dynamometry testing 90% of opposite arm Baseline: 10/07/2021 INITIAL  3 Pt will verbalize readiness to return to work Baseline:will progress as appropriate 10/07/2021 INITIAL  4 FOTO to meet goal Baseline: will evaluate as PT progresses 10/07/2021 INITIAL  5 Independent in long term HEP for continued strengthening Baseline: 10/07/2021 INITIAL                      PLAN: PT FREQUENCY: 1-2x/week   PT DURATION: 12 weeks   PLANNED INTERVENTIONS: Therapeutic exercises, Therapeutic activity, Neuro Muscular re-education, Patient/Family education, Joint mobilization, Aquatic Therapy, Dry Needling, Electrical stimulation, Spinal mobilization, Cryotherapy, Moist heat, Taping, Vasopneumatic device, and Manual therapy   PLAN FOR NEXT SESSION: Cont PROM and consider frequency     Carnella Fryman C. Julie-Anne Torain PT, DPT 07/21/21 3:38 PM

## 2021-07-27 ENCOUNTER — Encounter (HOSPITAL_BASED_OUTPATIENT_CLINIC_OR_DEPARTMENT_OTHER): Payer: Self-pay | Admitting: Physical Therapy

## 2021-07-27 ENCOUNTER — Other Ambulatory Visit: Payer: Self-pay

## 2021-07-27 ENCOUNTER — Ambulatory Visit (HOSPITAL_BASED_OUTPATIENT_CLINIC_OR_DEPARTMENT_OTHER): Payer: 59 | Admitting: Physical Therapy

## 2021-07-27 ENCOUNTER — Ambulatory Visit (INDEPENDENT_AMBULATORY_CARE_PROVIDER_SITE_OTHER): Payer: 59 | Admitting: Orthopaedic Surgery

## 2021-07-27 DIAGNOSIS — M25512 Pain in left shoulder: Secondary | ICD-10-CM

## 2021-07-27 DIAGNOSIS — M6281 Muscle weakness (generalized): Secondary | ICD-10-CM

## 2021-07-27 DIAGNOSIS — M25612 Stiffness of left shoulder, not elsewhere classified: Secondary | ICD-10-CM

## 2021-07-27 DIAGNOSIS — M25312 Other instability, left shoulder: Secondary | ICD-10-CM

## 2021-07-27 NOTE — Progress Notes (Signed)
Post Operative Evaluation    Procedure/Date of Surgery: left shoulder arthroscopic labral repair 07/11/21  Interval History:   Presents today 2 weeks status post the above procedure overall she is doing much better.  Her pain has been manageable.  She is occasionally taking hydrocodone for breakthrough.  She has been compliant with aspirin.  She is also taking her ibuprofen and Tylenol as recommended.  She uses a sling when in motion.  She has begun physical therapy and is already completed her for session.   PMH/PSH/Family History/Social History/Meds/Allergies:    Past Medical History:  Diagnosis Date   Diabetes mellitus without complication ([place])    Hypertension    Past Surgical History:  Procedure Laterality Date   FRACTURE SURGERY     rt ring finger   SHOULDER ARTHROSCOPY WITH LABRAL REPAIR Left 07/11/2021   Procedure: Left SHOULDER ARTHROSCOPY WITH LABRAL REPAIR;  Surgeon: Vanetta Mulders, MD;  Location: [facility];  Service: Orthopedics;  Laterality: Left;   Social History   Socioeconomic History   Marital status: Single    Spouse name: Not on file   Number of children: Not on file   Years of education: Not on file   Highest education level: Not on file  Occupational History   Not on file  Tobacco Use   Smoking status: Former    Types: Cigarettes    Quit date: 12/16/2018    Years since quitting: 2.6   Smokeless tobacco: Never  Vaping Use   Vaping Use: Never used  Substance and Sexual Activity   Alcohol use: Yes    Comment: social   Drug use: Never   Sexual activity: Not Currently  Other Topics Concern   Not on file  Social History Narrative   Not on file   Social Determinants of Health   Financial Resource Strain: Not on file  Food Insecurity: Not on file  Transportation Needs: Not on file  Physical Activity: Not on file  Stress: Not on file  Social Connections: Not on file   Family History  Family history unknown: Yes    No Known Allergies Current Outpatient Medications  Medication Sig Dispense Refill   acetaminophen (TYLENOL) 500 MG tablet Take 1,000 mg by mouth every 6 (six) hours as needed for headache or moderate pain.     Albuterol Sulfate (PROAIR RESPICLICK) 123XX123 (90 Base) MCG/ACT AEPB Inhale 2 puffs into the lungs every 6 (six) hours as needed. 1 each 4   amLODipine (NORVASC) 5 MG tablet TAKE 1 TABLET(5 MG) BY MOUTH DAILY 30 tablet 0   aspirin EC 325 MG tablet Take 1 tablet (325 mg total) by mouth daily. 30 tablet 0   cetirizine (ZYRTEC) 10 MG tablet Take 1 tablet (10 mg total) by mouth daily. (Patient not taking: Reported on 07/04/2021) 30 tablet 11   fluticasone (FLOVENT HFA) 44 MCG/ACT inhaler Inhale 2 puffs into the lungs in the morning and at bedtime. (Patient not taking: Reported on 07/04/2021) 1 Inhaler 12   HYDROmorphone (DILAUDID) 2 MG tablet Take 1 tablet (2 mg total) by mouth every 4 (four) hours as needed for severe pain. 30 tablet 0   losartan-hydrochlorothiazide (HYZAAR) 100-25 MG tablet Take 1 tablet by mouth daily. 90 tablet 1   Melatonin 1 MG CAPS Take 1 capsule (1 mg total) by mouth at  bedtime. 30 capsule 0   metFORMIN (GLUCOPHAGE-XR) 500 MG 24 hr tablet Take 1 tablet (500 mg total) by mouth daily with breakfast. 90 tablet 1   oxyCODONE (OXY IR/ROXICODONE) 5 MG immediate release tablet Take 1 tablet (5 mg total) by mouth every 4 (four) hours as needed (severe pain). 20 tablet 0   No current facility-administered medications for this visit.   No results found.  Review of Systems:   A ROS was performed including pertinent positives and negatives as documented in the HPI.   Musculoskeletal Exam:    Last menstrual period 05/19/2013.  Left shoulder portal incisions are clean dry and intact.  Sutures removed today.  Stable to flex and extend at the left elbow.  Stable to flex and extend the left wrist.  2+ radial pulse.  Imaging:    None  I personally reviewed and interpreted the  radiographs.   Assessment:   2 weeks status post left shoulder arthroscopic labral repair overall doing very well.  At this time I would like her to advance the protocol.  We will plan for a passive range of motion as well as passive assisted program until she is closer to full range of motion.  At that time we will advance active range of motion in approximately 6 weeks  Plan :    -Return to clinic in 4 weeks    I personally saw and evaluated the patient, and participated in the management and treatment plan.  Vanetta Mulders, MD Attending Physician, Orthopedic Surgery  This document was dictated using Dragon voice recognition software. A reasonable attempt at proof reading has been made to minimize errors.

## 2021-07-27 NOTE — Therapy (Signed)
OUTPATIENT PHYSICAL THERAPY TREATMENT NOTE   Patient Name: Judith Pollard MRN: [id number] DOB:Nov 05, 1965, 56 y.o., female Today's Date: 07/27/2021  PCP: Judith Skeans, MD REFERRING PROVIDER: Georganna Skeans, MD   PT End of Session - 07/27/21 1016     Visit Number 3    Number of Visits 25    Date for PT Re-Evaluation 10/07/21    Authorization Type [employer]    PT Start Time 1016    PT Stop Time 1104    PT Time Calculation (min) 48 min    Activity Tolerance Patient tolerated treatment well    Behavior During Therapy [facility] for tasks assessed/performed             Past Medical History:  Diagnosis Date   Diabetes mellitus without complication (HCC)    Hypertension    Past Surgical History:  Procedure Laterality Date   FRACTURE SURGERY     rt ring finger   SHOULDER ARTHROSCOPY WITH LABRAL REPAIR Left 07/11/2021   Procedure: Left SHOULDER ARTHROSCOPY WITH LABRAL REPAIR;  Surgeon: Judith Cote, MD;  Location: [facility] OR;  Service: Orthopedics;  Laterality: Left;   Patient Active Problem List   Diagnosis Date Noted   Instability of left shoulder joint    Asthma, cough variant 08/05/2019   Prediabetes 06/27/2019   Essential hypertension 06/26/2019   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/26/2019   Non-seasonal allergic rhinitis 06/26/2019   Morbid obesity (HCC) 06/26/2019   Hyperglycemia 06/26/2019    REFERRING DIAG: s/p Lt shoulder labral repair  THERAPY DIAG:  Acute pain of left shoulder  Muscle weakness (generalized)  Stiffness of left shoulder, not elsewhere classified  Left shoulder pain, unspecified chronicity  PERTINENT HISTORY: pre DM- per pt  PRECAUTIONS: shoulder, DOs 07/11/21  SUBJECTIVE: Pt denies regular use of sling unless she is going out for long periods of time.   PAIN:  Are you having pain? Yes NPRS scale: 6/10 Pain location: Lt shoulder and neck  Pain description: aching  Aggravating factors: constant ache Relieving factors: meds   OBJECTIVE:     PATIENT SURVEYS:  FOTO 42   COGNITION:          Overall cognitive status: Within functional limits for tasks assessed                               SENSATION:          WFL   POSTURE: Forward rounded shoulder as expected in sling   PALPATION: Incisions appear well healing with no s/s of infection   UPPER EXTREMITY AROM/PROM:  not appropriate to test at eval   A/PROM Left 07/14/2021 Left 07/27/2021  Shoulder flexion  Passive 30 deg  Passive 75 general pain  Shoulder extension      Shoulder abduction      Shoulder adduction      Shoulder internal rotation      Shoulder external rotation    0  Elbow flexion      Elbow extension      Wrist flexion      Wrist extension      Wrist ulnar deviation      Wrist radial deviation      Wrist pronation      Wrist supination      (Blank rows = not tested)   UPPER EXTREMITY MMT: not appropriate to test at eval   MMT Right 07/14/2021 Left 07/14/2021  Shoulder flexion  Shoulder extension      Shoulder abduction      Shoulder adduction      Shoulder internal rotation      Shoulder external rotation      Middle trapezius      Lower trapezius      Elbow flexion      Elbow extension      Wrist flexion      Wrist extension      Wrist ulnar deviation      Wrist radial deviation      Wrist pronation      Wrist supination      Grip strength (lbs)      (Blank rows = not tested)                    TODAY'S TREATMENT:  2/22: MANUAL: PROM shoulder, STM upper traps Supine AAROM into flexion, AAROM ER with wand Supine biceps curl with elbow supported Supine isometrics IR/ER & flexion resistance by PT, isometric extension into towel   2/16: Changed bandages and replaced with gauze and tegaderm PROM to shoulder STM & lateral mobilizations to cervical spine, trigger point release Lt upper trap, manual cervical distraction, Lt first rib depression   EVAL: PT changed bandages & fit sling with education on donning/doffing Wrist  AROM Elbow AAROM & resting stretch Upper trap & levator stretch     PATIENT EDUCATION: Education details: Anatomy of condition, POC, HEP, exercise form/rationale   Person educated: Patient Education method: Explanation, Demonstration, Tactile cues, Verbal cues, and Handouts Education comprehension: verbalized understanding, returned demonstration, verbal cues required, tactile cues required, and needs further education     HOME EXERCISE PROGRAM: [id number]   ASSESSMENT:   CLINICAL IMPRESSION: ROM improved significantly today. Added AAROM flexion and pt demo max of, visually, 20 deg with quick fatigue. Added to HEP and will cont to progress.   EVAL: Patient is a 56 y.o. F who was seen for physical therapy evaluation and treatment for Lt shoulder labral repair on 07/11/21. 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    Objective impairments include decreased activity tolerance, decreased mobility, decreased ROM, decreased strength, increased edema, increased muscle spasms, impaired flexibility, impaired sensation, impaired UE functional use, improper body mechanics, and pain. These impairments are limiting patient from cleaning, community activity, meal prep, occupation, laundry, and shopping. Personal factors including DM, 1 comorbidity: pre-DM  are also affecting patient's functional outcome. Patient will benefit from skilled PT to address above impairments and improve overall function.   REHAB POTENTIAL: Good   CLINICAL DECISION MAKING: Stable/uncomplicated   EVALUATION COMPLEXITY: Low     GOALS: Goals reviewed with patient? Yes   SHORT TERM GOALS:   STG Name Target Date Goal status  1 Comfortable passive flexion to 90 deg  Baseline: not tested at eval 08/05/2021 INITIAL  2 Good periscapular activation with shoulder at neutral Baseline: will progress as appropriate 08/05/2021 INITIAL   3 Independent with donning/doffing sling Baseline:educated at eval 07/21/21   achieved  4 Independent with HEP as it has been developed in short term Baseline:will progress as appropriate 08/05/2021 INITIAL                               LONG TERM GOALS:    LTG Name Target Date Goal status  1 Good form  in reaching Overhead Baseline:unable at eval 10/07/2021 INITIAL  2 Shoulder strength in dynamometry testing 90% of opposite arm Baseline: 10/07/2021 INITIAL  3 Pt will verbalize readiness to return to work Baseline:will progress as appropriate 10/07/2021 INITIAL  4 FOTO to meet goal Baseline: will evaluate as PT progresses 10/07/2021 INITIAL  5 Independent in long term HEP for continued strengthening Baseline: 10/07/2021 INITIAL                      PLAN: PT FREQUENCY: 1-2x/week   PT DURATION: 12 weeks   PLANNED INTERVENTIONS: Therapeutic exercises, Therapeutic activity, Neuro Muscular re-education, Patient/Family education, Joint mobilization, Aquatic Therapy, Dry Needling, Electrical stimulation, Spinal mobilization, Cryotherapy, Moist heat, Taping, Vasopneumatic device, and Manual therapy   PLAN FOR NEXT SESSION: Cont PROM and consider frequency   Lanisha Stepanian C. Aliviah Spain PT, DPT 07/27/21 10:56 AM

## 2021-08-04 ENCOUNTER — Other Ambulatory Visit: Payer: Self-pay

## 2021-08-04 ENCOUNTER — Encounter (HOSPITAL_BASED_OUTPATIENT_CLINIC_OR_DEPARTMENT_OTHER): Payer: Self-pay | Admitting: Physical Therapy

## 2021-08-04 ENCOUNTER — Ambulatory Visit (HOSPITAL_BASED_OUTPATIENT_CLINIC_OR_DEPARTMENT_OTHER): Payer: 59 | Attending: Orthopaedic Surgery | Admitting: Physical Therapy

## 2021-08-04 DIAGNOSIS — M25612 Stiffness of left shoulder, not elsewhere classified: Secondary | ICD-10-CM | POA: Insufficient documentation

## 2021-08-04 DIAGNOSIS — M25512 Pain in left shoulder: Secondary | ICD-10-CM | POA: Diagnosis present

## 2021-08-04 DIAGNOSIS — M6281 Muscle weakness (generalized): Secondary | ICD-10-CM | POA: Diagnosis present

## 2021-08-04 NOTE — Therapy (Signed)
?OUTPATIENT PHYSICAL THERAPY TREATMENT NOTE ? ? ?Patient Name: Judith Pollard ?MRN: [id number] ?DOB:04-30-1966, 56 y.o., female ?Today's Date: 08/04/2021 ? ?PCP: Dorna Mai, MD ?REFERRING PROVIDER: Dorna Mai, MD ? ? PT End of Session - 08/04/21 1515   ? ? Visit Number 4   ? Number of Visits 25   ? Date for PT Re-Evaluation 10/07/21   ? Authorization Type UHC   ? PT Start Time 1515   ? PT Stop Time [id number]   ? PT Time Calculation (min) 32 min   ? Activity Tolerance Patient tolerated treatment well   ? Behavior During Therapy [facility] for tasks assessed/performed   ? ?  ?  ? ?  ? ? ?Past Medical History:  ?Diagnosis Date  ? Diabetes mellitus without complication ([place])   ? Hypertension   ? ?Past Surgical History:  ?Procedure Laterality Date  ? FRACTURE SURGERY    ? rt ring finger  ? SHOULDER ARTHROSCOPY WITH LABRAL REPAIR Left 07/11/2021  ? Procedure: Left SHOULDER ARTHROSCOPY WITH LABRAL REPAIR;  Surgeon: Vanetta Mulders, MD;  Location: [place];  Service: Orthopedics;  Laterality: Left;  ? ?Patient Active Problem List  ? Diagnosis Date Noted  ? Instability of left shoulder joint   ? Asthma, cough variant 08/05/2019  ? Prediabetes 06/27/2019  ? Essential hypertension 06/26/2019  ? 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/26/2019  ? Non-seasonal allergic rhinitis 06/26/2019  ? Morbid obesity ([place]) 06/26/2019  ? Hyperglycemia 06/26/2019  ? ? ?REFERRING DIAG: s/p Lt shoulder labral repair ? ?THERAPY DIAG:  ?Acute pain of left shoulder ? ?Muscle weakness (generalized) ? ?Stiffness of left shoulder, not elsewhere classified ? ?Left shoulder pain, unspecified chronicity ? ?PERTINENT HISTORY: pre DM- per pt ? ?PRECAUTIONS: shoulder, DOs 07/11/21 ? ?SUBJECTIVE: Using sling as instructed. Neck is getting better, I am moving it a lot.  ? ?PAIN:  ?Are you having pain? Yes ?NPRS scale: 3-4/10 ?Pain location: Lt shoulder and neck ? ?Pain description: aching  ?Aggravating factors: constant ache ?Relieving factors: meds ? ? ?OBJECTIVE:  ?  ?PATIENT  SURVEYS:  ?FOTO 13 ?  ?COGNITION: ?         Overall cognitive status: Within functional limits for tasks assessed ?                              ?SENSATION: ?         WFL ?  ?POSTURE: ?Forward rounded shoulder as expected in sling ?  ?PALPATION: ?Incisions appear well healing with no s/s of infection ?  ?UPPER EXTREMITY AROM/PROM:  not appropriate to test at eval ?  ?A/PROM Left ?07/14/2021 Left ?07/27/2021 Left ?08/04/21  ?Shoulder flexion  Passive 30 deg  Passive 75 general pain Passive to 90 end range discomfort  ?Shoulder extension       ?Shoulder abduction     45  ?Shoulder adduction       ?Shoulder internal rotation       ?Shoulder external rotation    0 10  ?Elbow flexion       ?Elbow extension       ?Wrist flexion       ?Wrist extension       ?Wrist ulnar deviation       ?Wrist radial deviation       ?Wrist pronation       ?Wrist supination       ?(Blank rows = not tested) ?  ?UPPER EXTREMITY MMT: not  appropriate to test at eval ?  ?MMT Right ?07/14/2021 Left ?07/14/2021  ?Shoulder flexion      ?Shoulder extension      ?Shoulder abduction      ?Shoulder adduction      ?Shoulder internal rotation      ?Shoulder external rotation      ?Middle trapezius      ?Lower trapezius      ?Elbow flexion      ?Elbow extension      ?Wrist flexion      ?Wrist extension      ?Wrist ulnar deviation      ?Wrist radial deviation      ?Wrist pronation      ?Wrist supination      ?Grip strength (lbs)      ?(Blank rows = not tested) ?  ?  ?  ?  ?           ?TODAY'S TREATMENT:  ?3/2: ?MANUAL: PROM within protocol limits, grade 2 mobs A-P and inf at [facility], STM to Lt upper trap ? ?2/22: ?MANUAL: PROM shoulder, STM upper traps ?Supine AAROM into flexion, AAROM ER with wand ?Supine biceps curl with elbow supported ?Supine isometrics IR/ER & flexion resistance by PT, isometric extension into towel ? ? ?2/16: ?Changed bandages and replaced with gauze and tegaderm ?PROM to shoulder ?STM & lateral mobilizations to cervical spine, trigger point  release Lt upper trap, manual cervical distraction, Lt first rib depression ? ? ?EVAL: ?PT changed bandages & fit sling with education on donning/doffing ?Wrist AROM ?Elbow AAROM & resting stretch ?Upper trap & levator stretch ?  ?  ?PATIENT EDUCATION: ?Education details: Anatomy of condition, POC, HEP, exercise form/rationale ?  ?Person educated: Patient ?Education method: Explanation, Demonstration, Tactile cues, Verbal cues, and Handouts ?Education comprehension: verbalized understanding, returned demonstration, verbal cues required, tactile cues required, and needs further education ?  ?  ?HOME EXERCISE PROGRAM: ?[id number] ?  ?ASSESSMENT: ?  ?CLINICAL IMPRESSION: ?Focused on manual therapy today to improve ROM. Pinching at end range was pushed to higher end ranges with mobilization and STM.  ? ?EVAL: Patient is a 56 y.o. F who was seen for physical therapy evaluation and treatment for Lt shoulder labral repair on 07/11/21. 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  ?  ?Objective impairments include decreased activity tolerance, decreased mobility, decreased ROM, decreased strength, increased edema, increased muscle spasms, impaired flexibility, impaired sensation, impaired UE functional use, improper body mechanics, and pain. These impairments are limiting patient from cleaning, community activity, meal prep, occupation, laundry, and shopping. Personal factors including DM, 1 comorbidity: pre-DM  are also affecting patient's functional outcome. Patient will benefit from skilled PT to address above impairments and improve overall function. ?  ?REHAB POTENTIAL: Good ?  ?CLINICAL DECISION MAKING: Stable/uncomplicated ?  ?EVALUATION COMPLEXITY: Low ?  ?  ?GOALS: ?Goals reviewed with patient? Yes ?  ?SHORT TERM GOALS: ?  ?STG Name Target Date Goal status  ?1 Comfortable passive flexion to 90 deg  ?Baseline: not tested at  eval 08/05/2021 achieved  ?2 Good periscapular activation with shoulder at neutral ?Baseline: will progress as appropriate 08/05/2021 achieved  ?3 Independent with donning/doffing sling ?Baseline:educated at eval 07/21/21 ?  achieved  ?4 Independent with HEP as it has been developed in short term ?Baseline:will progress as appropriate 08/05/2021 achieved  ?         ?         ?         ?  ?  LONG TERM GOALS:  ?  ?LTG Name Target Date Goal status  ?1 Good form in reaching Overhead ?Baseline:unable at eval 10/07/2021 INITIAL  ?2 Shoulder strength in dynamometry testing 90% of opposite arm ?Baseline: 10/07/2021 INITIAL  ?3 Pt will verbalize readiness to return to work ?Baseline:will progress as appropriate 10/07/2021 INITIAL  ?4 FOTO to meet goal ?Baseline: will evaluate as PT progresses 10/07/2021 INITIAL  ?5 Independent in long term HEP for continued strengthening ?Baseline: 10/07/2021 INITIAL  ?         ?         ?  ?PLAN: ?PT FREQUENCY: 1-2x/week ?  ?PT DURATION: 12 weeks ?  ?PLANNED INTERVENTIONS: Therapeutic exercises, Therapeutic activity, Neuro Muscular re-education, Patient/Family education, Joint mobilization, Aquatic Therapy, Dry Needling, Electrical stimulation, Spinal mobilization, Cryotherapy, Moist heat, Taping, Vasopneumatic device, and Manual therapy ?  ?PLAN FOR NEXT SESSION: Cont PROM and consider frequency ?  ?Solaris Kram C. Gustie Bobb PT, DPT ?08/04/21 3:50 PM ? ?  ? ?

## 2021-08-09 ENCOUNTER — Ambulatory Visit (HOSPITAL_BASED_OUTPATIENT_CLINIC_OR_DEPARTMENT_OTHER): Payer: 59 | Admitting: Physical Therapy

## 2021-08-11 ENCOUNTER — Encounter (HOSPITAL_BASED_OUTPATIENT_CLINIC_OR_DEPARTMENT_OTHER): Payer: Self-pay | Admitting: Physical Therapy

## 2021-08-11 ENCOUNTER — Ambulatory Visit (HOSPITAL_BASED_OUTPATIENT_CLINIC_OR_DEPARTMENT_OTHER): Payer: 59 | Admitting: Physical Therapy

## 2021-08-11 ENCOUNTER — Other Ambulatory Visit: Payer: Self-pay

## 2021-08-11 DIAGNOSIS — M25512 Pain in left shoulder: Secondary | ICD-10-CM | POA: Diagnosis not present

## 2021-08-11 DIAGNOSIS — M25612 Stiffness of left shoulder, not elsewhere classified: Secondary | ICD-10-CM

## 2021-08-11 DIAGNOSIS — M6281 Muscle weakness (generalized): Secondary | ICD-10-CM

## 2021-08-11 NOTE — Therapy (Signed)
?OUTPATIENT PHYSICAL THERAPY TREATMENT NOTE ? ? ?Patient Name: Judith Pollard ?MRN: [id number] ?DOB:21-Oct-1965, 56 y.o., female ?Today's Date: 08/11/2021 ? ?PCP: Dorna Mai, MD ?REFERRING PROVIDER: Dorna Mai, MD ? ? PT End of Session - 08/11/21 0909   ? ? Visit Number 5   ? Number of Visits 25   ? Date for PT Re-Evaluation 10/07/21   ? Authorization Type UHC   ? PT Start Time 0845   ? PT Stop Time [phone]   ? PT Time Calculation (min) 38 min   ? Activity Tolerance Patient tolerated treatment well   ? Behavior During Therapy [facility] for tasks assessed/performed   ? ?  ?  ? ?  ? ? ? ?Past Medical History:  ?Diagnosis Date  ? Diabetes mellitus without complication ([place])   ? Hypertension   ? ?Past Surgical History:  ?Procedure Laterality Date  ? FRACTURE SURGERY    ? rt ring finger  ? SHOULDER ARTHROSCOPY WITH LABRAL REPAIR Left 07/11/2021  ? Procedure: Left SHOULDER ARTHROSCOPY WITH LABRAL REPAIR;  Surgeon: Vanetta Mulders, MD;  Location: [place];  Service: Orthopedics;  Laterality: Left;  ? ?Patient Active Problem List  ? Diagnosis Date Noted  ? Instability of left shoulder joint   ? Asthma, cough variant 08/05/2019  ? Prediabetes 06/27/2019  ? Essential hypertension 06/26/2019  ? 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/26/2019  ? Non-seasonal allergic rhinitis 06/26/2019  ? Morbid obesity ([place]) 06/26/2019  ? Hyperglycemia 06/26/2019  ? ? ?REFERRING DIAG: s/p Lt shoulder labral repair ? ?THERAPY DIAG:  ?Acute pain of left shoulder ? ?Muscle weakness (generalized) ? ?Stiffness of left shoulder, not elsewhere classified ? ?Left shoulder pain, unspecified chronicity ? ?PERTINENT HISTORY: pre DM- per pt ? ?PRECAUTIONS: shoulder, DOs 07/11/21 ? ?SUBJECTIVE: Pt states the shoulder feels good today. She has had no pain. She still has some neck soreness and stiff on the L.  ? ?PAIN:  ?Are you having pain? Yes ?NPRS scale: 3/10 ?Pain location: neck ? ?Pain description: aching  ?Aggravating factors: constant ache ?Relieving factors:  meds ? ? ?OBJECTIVE:  ?  ?PATIENT SURVEYS:  ?FOTO 54 ?  ?COGNITION: ?         Overall cognitive status: Within functional limits for tasks assessed ?                              ?SENSATION: ?         WFL ?  ?POSTURE: ?Forward rounded shoulder as expected in sling ?  ?PALPATION: ?Incisions appear well healing with no s/s of infection ?  ?UPPER EXTREMITY AROM/PROM:  not appropriate to test at eval ?  ?A/PROM Left ?07/14/2021 Left ?07/27/2021 Left ?08/04/21  ?Shoulder flexion  Passive 30 deg  Passive 75 general pain Passive to 90 end range discomfort  ?Shoulder extension       ?Shoulder abduction     45  ?Shoulder adduction       ?Shoulder internal rotation       ?Shoulder external rotation    0 10  ?Elbow flexion       ?Elbow extension       ?Wrist flexion       ?Wrist extension       ?Wrist ulnar deviation       ?Wrist radial deviation       ?Wrist pronation       ?Wrist supination       ?(Blank rows = not  tested) ?  ?UPPER EXTREMITY MMT: not appropriate to test at eval ?  ?MMT Right ?07/14/2021 Left ?07/14/2021  ?Shoulder flexion      ?Shoulder extension      ?Shoulder abduction      ?Shoulder adduction      ?Shoulder internal rotation      ?Shoulder external rotation      ?Middle trapezius      ?Lower trapezius      ?Elbow flexion      ?Elbow extension      ?Wrist flexion      ?Wrist extension      ?Wrist ulnar deviation      ?Wrist radial deviation      ?Wrist pronation      ?Wrist supination      ?Grip strength (lbs)      ?(Blank rows = not tested) ?  ?Weeks 4-8 ? Increase AROM  ?160? FF ?45? ER at side ?160? ABD ?IR behind back to waist ?Cross body ADD at 6wk ?  ?  ?           ?TODAY'S TREATMENT:  ? ?3/2: ?ROM within protocol limits ?Joint mob: grade II-III post and inf  ? ?Table slide ABD flexion 5s 15x ?Pendulum 20x fwd, retro, med and lat ?AAROM dowel ER 5s 15x ?Scap roll ant and post 20x each ?Shoulder isometrics in sling- ER, IR, ext 3s 10x ? ? ? ?3/2: ?MANUAL: PROM within protocol limits, grade 2 mobs A-P and inf  at [facility], STM to Lt upper trap ? ?2/22: ?MANUAL: PROM shoulder, STM upper traps ?Supine AAROM into flexion, AAROM ER with wand ?Supine biceps curl with elbow supported ?Supine isometrics IR/ER & flexion resistance by PT, isometric extension into towel ? ? ?2/16: ?Changed bandages and replaced with gauze and tegaderm ?PROM to shoulder ?STM & lateral mobilizations to cervical spine, trigger point release Lt upper trap, manual cervical distraction, Lt first rib depression ? ? ?EVAL: ?PT changed bandages & fit sling with education on donning/doffing ?Wrist AROM ?Elbow AAROM & resting stretch ?Upper trap & levator stretch ?  ?  ?PATIENT EDUCATION: ?Education details: anatomy, exercise progression, emphasis on ROM, muscle firing,  envelope of function, HEP ? ?  ?Person educated: Patient ?Education method: Explanation, Demonstration, Tactile cues, Verbal cues, and Handouts ?Education comprehension: verbalized understanding, returned demonstration, verbal cues required, tactile cues required, and needs further education ?  ?  ?HOME EXERCISE PROGRAM: ?[id number] ?  ?ASSESSMENT: ?  ?CLINICAL IMPRESSION: ?Pt ROM was taken to tolerance but pt is very stiff into flexion and ABD with difficulty relaxing/allowing for PROM. Pt was given AAROM with hand on table to help with prevention of guarding. Pt HEP updated accordingly to try and progress OH ROM. Pt apprehension likely currently limiting ROM progression. Pt is in the 4-8wk phase of protocol. Plan to progress as tolerated.  ? ?EVAL: Patient is a 56 y.o. F who was seen for physical therapy evaluation and treatment for Lt shoulder labral repair on 07/11/21. 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  ?  ?Objective impairments include decreased activity tolerance, decreased mobility, decreased ROM, decreased strength, increased edema, increased muscle spasms, impaired flexibility, impaired  sensation, impaired UE functional use, improper body mechanics, and pain. These impairments are limiting patient from cleaning, community activity, meal prep, occupation, laundry, and shopping. Personal factors including DM, 1 comorbidity: pre-DM  are also affecting  patient's functional outcome. Patient will benefit from skilled PT to address above impairments and improve overall function. ?  ?REHAB POTENTIAL: Good ?  ?CLINICAL DECISION MAKING: Stable/uncomplicated ?  ?EVALUATION COMPLEXITY: Low ?  ?  ?GOALS: ?Goals reviewed with patient? Yes ?  ?SHORT TERM GOALS: ?  ?STG Name Target Date Goal status  ?1 Comfortable passive flexion to 90 deg  ?Baseline: not tested at eval 08/05/2021 achieved  ?2 Good periscapular activation with shoulder at neutral ?Baseline: will progress as appropriate 08/05/2021 achieved  ?3 Independent with donning/doffing sling ?Baseline:educated at eval 07/21/21 ?  achieved  ?4 Independent with HEP as it has been developed in short term ?Baseline:will progress as appropriate 08/05/2021 achieved  ?         ?         ?         ?  ?LONG TERM GOALS:  ?  ?LTG Name Target Date Goal status  ?1 Good form in reaching Overhead ?Baseline:unable at eval 10/07/2021 INITIAL  ?2 Shoulder strength in dynamometry testing 90% of opposite arm ?Baseline: 10/07/2021 INITIAL  ?3 Pt will verbalize readiness to return to work ?Baseline:will progress as appropriate 10/07/2021 INITIAL  ?4 FOTO to meet goal ?Baseline: will evaluate as PT progresses 10/07/2021 INITIAL  ?5 Independent in long term HEP for continued strengthening ?Baseline: 10/07/2021 INITIAL  ?         ?         ?  ?PLAN: ?PT FREQUENCY: 1-2x/week ?  ?PT DURATION: 12 weeks ?  ?PLANNED INTERVENTIONS: Therapeutic exercises, Therapeutic activity, Neuro Muscular re-education, Patient/Family education, Joint mobilization, Aquatic Therapy, Dry Needling, Electrical stimulation, Spinal mobilization, Cryotherapy, Moist heat, Taping, Vasopneumatic device, and Manual therapy ?  ?PLAN  FOR NEXT SESSION: Cont PROM and consider frequency ?  ?Daleen Bo PT, DPT ?08/11/21 9:30 AM ? ? ?  ? ?

## 2021-08-15 NOTE — Therapy (Incomplete)
OUTPATIENT PHYSICAL THERAPY TREATMENT NOTE   Patient Name: Judith Pollard MRN: [id number] DOB:[birth year], 56 y.o., female Today's Date: 08/15/2021  PCP: Georganna Skeans, MD REFERRING PROVIDER: Georganna Skeans, MD      Past Medical History:  Diagnosis Date   Diabetes mellitus without complication [facility])    Hypertension    Past Surgical History:  Procedure Laterality Date   FRACTURE SURGERY     rt ring finger   SHOULDER ARTHROSCOPY WITH LABRAL REPAIR Left 07/11/2021   Procedure: Left SHOULDER ARTHROSCOPY WITH LABRAL REPAIR;  Surgeon: Huel Cote, MD;  Location: [facility];  Service: Orthopedics;  Laterality: Left;   Patient Active Problem List   Diagnosis Date Noted   Instability of left shoulder joint    Asthma, cough variant 08/05/2019   Prediabetes 06/27/2019   Essential hypertension 06/26/2019   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/26/2019   Non-seasonal allergic rhinitis 06/26/2019   Morbid obesity ([facility]) 06/26/2019   Hyperglycemia 06/26/2019    REFERRING DIAG: s/p Lt shoulder labral repair  THERAPY DIAG:  No diagnosis found.  PERTINENT HISTORY: pre DM- per pt  PRECAUTIONS: shoulder, DOS 07/11/21  SUBJECTIVE: Pt is 5 weeks and 1 day s/p states the shoulder feels good today. She has had no pain. She still has some neck soreness and stiff on the L.   PAIN:  Are you having pain? Yes NPRS scale: 3/10 Pain location: neck  Pain description: aching  Aggravating factors: constant ache Relieving factors: meds   OBJECTIVE:    PATIENT SURVEYS:  FOTO 42   COGNITION:          Overall cognitive status: Within functional limits for tasks assessed                               SENSATION:          WFL   POSTURE: Forward rounded shoulder as expected in sling   PALPATION: Incisions appear well healing with no s/s of infection   UPPER EXTREMITY AROM/PROM:  not appropriate to test at eval   A/PROM Left 07/14/2021 Left 07/27/2021 Left 08/04/21  Shoulder flexion  Passive 30  deg  Passive 75 general pain Passive to 90 end range discomfort  Shoulder extension       Shoulder abduction     45  Shoulder adduction       Shoulder internal rotation       Shoulder external rotation    0 10  Elbow flexion       Elbow extension       Wrist flexion       Wrist extension       Wrist ulnar deviation       Wrist radial deviation       Wrist pronation       Wrist supination       (Blank rows = not tested)   UPPER EXTREMITY MMT: not appropriate to test at eval   MMT Right 07/14/2021 Left 07/14/2021  Shoulder flexion      Shoulder extension      Shoulder abduction      Shoulder adduction      Shoulder internal rotation      Shoulder external rotation      Middle trapezius      Lower trapezius      Elbow flexion      Elbow extension      Wrist flexion      Wrist  extension      Wrist ulnar deviation      Wrist radial deviation      Wrist pronation      Wrist supination      Grip strength (lbs)      (Blank rows = not tested)   Weeks 4-8  Increase AROM  160 FF 45 ER at side 160 ABD IR behind back to waist Cross body ADD at 6wk                TODAY'S TREATMENT:   3/2: ROM within protocol limits Joint mob: grade II-III post and inf   Table slide ABD flexion 5s 15x Pendulum 20x fwd, retro, med and lat AAROM dowel ER 5s 15x Scap roll ant and post 20x each Shoulder isometrics in sling- ER, IR, ext 3s 10x    3/2: MANUAL: PROM within protocol limits, grade 2 mobs A-P and inf at [facility], STM to Lt upper trap  2/22: MANUAL: PROM shoulder, STM upper traps Supine AAROM into flexion, AAROM ER with wand Supine biceps curl with elbow supported Supine isometrics IR/ER & flexion resistance by PT, isometric extension into towel   2/16: Changed bandages and replaced with gauze and tegaderm PROM to shoulder STM & lateral mobilizations to cervical spine, trigger point release Lt upper trap, manual cervical distraction, Lt first rib depression   EVAL: PT  changed bandages & fit sling with education on donning/doffing Wrist AROM Elbow AAROM & resting stretch Upper trap & levator stretch     PATIENT EDUCATION: Education details: anatomy, exercise progression, emphasis on ROM, muscle firing,  envelope of function, HEP    Person educated: Patient Education method: Explanation, Demonstration, Tactile cues, Verbal cues, and Handouts Education comprehension: verbalized understanding, returned demonstration, verbal cues required, tactile cues required, and needs further education     HOME EXERCISE PROGRAM: [id number]   ASSESSMENT:   CLINICAL IMPRESSION: Pt ROM was taken to tolerance but pt is very stiff into flexion and ABD with difficulty relaxing/allowing for PROM. Pt was given AAROM with hand on table to help with prevention of guarding. Pt HEP updated accordingly to try and progress OH ROM. Pt apprehension likely currently limiting ROM progression. Pt is in the 4-8wk phase of protocol. Plan to progress as tolerated.   EVAL: Patient is a 56 y.o. F who was seen for physical therapy evaluation and treatment for Lt shoulder labral repair on 07/11/21. 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    Objective impairments include decreased activity tolerance, decreased mobility, decreased ROM, decreased strength, increased edema, increased muscle spasms, impaired flexibility, impaired sensation, impaired UE functional use, improper body mechanics, and pain. These impairments are limiting patient from cleaning, community activity, meal prep, occupation, laundry, and shopping. Personal factors including DM, 1 comorbidity: pre-DM  are also affecting patient's functional outcome. Patient will benefit from skilled PT to address above impairments and improve overall function.   REHAB POTENTIAL: Good   CLINICAL DECISION MAKING: Stable/uncomplicated   EVALUATION  COMPLEXITY: Low     GOALS: Goals reviewed with patient? Yes   SHORT TERM GOALS:   STG Name Target Date Goal status  1 Comfortable passive flexion to 90 deg  Baseline: not tested at eval 08/05/2021 achieved  2 Good periscapular activation with shoulder at neutral Baseline: will progress as appropriate 08/05/2021 achieved  3 Independent with donning/doffing sling Baseline:educated at eval 07/21/21  achieved  4 Independent with HEP as it has been developed in short term Baseline:will progress as appropriate 08/05/2021 achieved                               LONG TERM GOALS:    LTG Name Target Date Goal status  1 Good form in reaching Overhead Baseline:unable at eval 10/07/2021 INITIAL  2 Shoulder strength in dynamometry testing 90% of opposite arm Baseline: 10/07/2021 INITIAL  3 Pt will verbalize readiness to return to work Baseline:will progress as appropriate 10/07/2021 INITIAL  4 FOTO to meet goal Baseline: will evaluate as PT progresses 10/07/2021 INITIAL  5 Independent in long term HEP for continued strengthening Baseline: 10/07/2021 INITIAL                      PLAN: PT FREQUENCY: 1-2x/week   PT DURATION: 12 weeks   PLANNED INTERVENTIONS: Therapeutic exercises, Therapeutic activity, Neuro Muscular re-education, Patient/Family education, Joint mobilization, Aquatic Therapy, Dry Needling, Electrical stimulation, Spinal mobilization, Cryotherapy, Moist heat, Taping, Vasopneumatic device, and Manual therapy   PLAN FOR NEXT SESSION: Cont PROM and consider frequency   Zebedee IbaAlan Zhou PT, DPT 08/15/21 11:03 PM

## 2021-08-16 ENCOUNTER — Other Ambulatory Visit: Payer: Self-pay

## 2021-08-16 ENCOUNTER — Ambulatory Visit (HOSPITAL_BASED_OUTPATIENT_CLINIC_OR_DEPARTMENT_OTHER): Payer: 59 | Admitting: Physical Therapy

## 2021-08-16 ENCOUNTER — Encounter (HOSPITAL_BASED_OUTPATIENT_CLINIC_OR_DEPARTMENT_OTHER): Payer: Self-pay | Admitting: Physical Therapy

## 2021-08-16 DIAGNOSIS — M25512 Pain in left shoulder: Secondary | ICD-10-CM

## 2021-08-16 DIAGNOSIS — M6281 Muscle weakness (generalized): Secondary | ICD-10-CM

## 2021-08-16 DIAGNOSIS — M25612 Stiffness of left shoulder, not elsewhere classified: Secondary | ICD-10-CM

## 2021-08-18 ENCOUNTER — Ambulatory Visit (HOSPITAL_BASED_OUTPATIENT_CLINIC_OR_DEPARTMENT_OTHER): Payer: 59 | Admitting: Physical Therapy

## 2021-08-23 ENCOUNTER — Encounter (HOSPITAL_BASED_OUTPATIENT_CLINIC_OR_DEPARTMENT_OTHER): Payer: Self-pay | Admitting: Physical Therapy

## 2021-08-24 ENCOUNTER — Other Ambulatory Visit: Payer: Self-pay

## 2021-08-24 ENCOUNTER — Encounter (HOSPITAL_BASED_OUTPATIENT_CLINIC_OR_DEPARTMENT_OTHER): Payer: Self-pay | Admitting: Physical Therapy

## 2021-08-24 ENCOUNTER — Ambulatory Visit (INDEPENDENT_AMBULATORY_CARE_PROVIDER_SITE_OTHER): Payer: 59 | Admitting: Orthopaedic Surgery

## 2021-08-24 ENCOUNTER — Ambulatory Visit (HOSPITAL_BASED_OUTPATIENT_CLINIC_OR_DEPARTMENT_OTHER): Payer: 59 | Admitting: Physical Therapy

## 2021-08-24 DIAGNOSIS — M25312 Other instability, left shoulder: Secondary | ICD-10-CM

## 2021-08-24 DIAGNOSIS — M25512 Pain in left shoulder: Secondary | ICD-10-CM

## 2021-08-24 DIAGNOSIS — M6281 Muscle weakness (generalized): Secondary | ICD-10-CM

## 2021-08-24 DIAGNOSIS — M25612 Stiffness of left shoulder, not elsewhere classified: Secondary | ICD-10-CM

## 2021-08-24 NOTE — Therapy (Signed)
?OUTPATIENT PHYSICAL THERAPY TREATMENT NOTE ? ? ?Patient Name: Judith Pollard ?MRN: [id number] ?DOB:01-19-1966, 56 y.o., female ?Today's Date: 08/24/2021 ? ?PCP: Georganna Skeans, MD ? ? PT End of Session - 08/24/21 0804   ? ? Visit Number 7   ? Number of Visits 25   ? Date for PT Re-Evaluation 10/07/21   ? Authorization Type UHC   ? PT Start Time [phone]   ? PT Stop Time 0849   ? PT Time Calculation (min) 45 min   ? Activity Tolerance Patient tolerated treatment well   ? Behavior During Therapy [facility] for tasks assessed/performed   ? ?  ?  ? ?  ? ? ? ? ?Past Medical History:  ?Diagnosis Date  ? Diabetes mellitus without complication (HCC)   ? Hypertension   ? ?Past Surgical History:  ?Procedure Laterality Date  ? FRACTURE SURGERY    ? rt ring finger  ? SHOULDER ARTHROSCOPY WITH LABRAL REPAIR Left 07/11/2021  ? Procedure: Left SHOULDER ARTHROSCOPY WITH LABRAL REPAIR;  Surgeon: Huel Cote, MD;  Location: [facility] OR;  Service: Orthopedics;  Laterality: Left;  ? ?Patient Active Problem List  ? Diagnosis Date Noted  ? Instability of left shoulder joint   ? Asthma, cough variant 08/05/2019  ? Prediabetes 06/27/2019  ? Essential hypertension 06/26/2019  ? 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/26/2019  ? Non-seasonal allergic rhinitis 06/26/2019  ? Morbid obesity (HCC) 06/26/2019  ? Hyperglycemia 06/26/2019  ? ?REFERRING MD:  Huel Cote, MD ? ?REFERRING DIAG: s/p Lt shoulder labral repair ? ?THERAPY DIAG:  ?Acute pain of left shoulder ? ?Muscle weakness (generalized) ? ?Stiffness of left shoulder, not elsewhere classified ? ?Left shoulder pain, unspecified chronicity ? ?PERTINENT HISTORY: pre DM- per pt ? ?PRECAUTIONS: shoulder, DOS 07/11/21 ? ?SUBJECTIVE: a little soreness in upper trap but overall ok. Left sling in sons car in [place] ? ?PAIN:  ?Are you having pain? No ?NPRS scale: 0/10 ?Pain location: superior shoulder ? ?Pain description: aching  ?Aggravating factors: constant ache ?Relieving factors: meds ? ? ?OBJECTIVE:  ?  ? ?   ?UPPER EXTREMITY AROM/PROM:  not appropriate to test at eval ?  ?A/PROM Left ?07/14/2021 Left ?07/27/2021 Left ?08/04/21 Left ?PROM ?08/16/2021 LEFT ?AROM ?08/24/21  ?Shoulder flexion  Passive 30 deg  Passive 75 general pain Passive to 90 end range discomfort 109 90  ?Shoulder extension         ?Shoulder abduction     45  75  ?Shoulder adduction         ?Shoulder internal rotation       Thumb to mid glut  ?Shoulder external rotation    0 10 14   ?Elbow flexion         ?Elbow extension         ?Wrist flexion         ?Wrist extension         ?Wrist ulnar deviation         ?Wrist radial deviation         ?Wrist pronation         ?Wrist supination         ?(Blank rows = not tested) ?  ?  ?Weeks 4-8 ? 160? FF ?45? ER at side ?160? ABD ?IR behind back to waist ?Cross body ADD at 6wk ?  ?  ?           ?TODAY'S TREATMENT:  ?3/22: ?MANUAL: PROM & stretching, post & inf mobs, STM upper traps,  scar mobs, STM infraspinatus ?Supine flexion with wand ?Sidelying ER ?Supine protraction 3x10 ?Table slides flexion & abd ? ?3/14 ?Therapeutic Exercise: ?Pt performed: ?Pendulums cw, ccw, and f/b x 20 reps each ?Supine wand ER x20 reps ?Supine wand flexion x 10 reps ?Supine clasped hands flexion AAROM x 10 reps ?Submax shoulder isometrics in sling ER, IR, ext, flex with 5 sec hold x 10 reps ? ?Manual Therapy:   ?Pt received grade II post and inf jt mobs to improve pain, ROM, stiffness, and to normalize arthrokinematics. ?Pt received L shoulder PROM in flexion, abd, and ER per protocol ranges and pt tolerance. ?Assessed shoulder PROM. ? ? ? ?  ?PATIENT EDUCATION: ?Education details: anatomy, exercise progression, emphasis on ROM, Appropriate ROM, POC, HEP  ?Person educated: Patient ?Education method: Explanation, Demonstration, Tactile cues, Verbal cues ?Education comprehension: verbalized understanding, returned demonstration, verbal cues required, tactile cues required, and needs further education ?  ?  ?HOME EXERCISE PROGRAM: ?[id number] ?   ?ASSESSMENT: ?  ?CLINICAL IMPRESSION: ?Pt has made good progress toward increased ROM since beginning PT. Limited by tightness in upper trap. Biceps area pain improved following trigger point release in infraspinatus. Plans to return to work May 5 and requires heavy/jerky pushing and pulling.  ? ?Objective impairments include decreased activity tolerance, decreased mobility, decreased ROM, decreased strength, increased edema, increased muscle spasms, impaired flexibility, impaired sensation, impaired UE functional use, improper body mechanics, and pain. These impairments are limiting patient from cleaning, community activity, meal prep, occupation, laundry, and shopping. Personal factors including DM, 1 comorbidity: pre-DM  are also affecting patient's functional outcome. Patient will benefit from skilled PT to address above impairments and improve overall function. ?  ?REHAB POTENTIAL: Good ?  ?CLINICAL DECISION MAKING: Stable/uncomplicated ?  ?EVALUATION COMPLEXITY: Low ?  ?  ?GOALS: ?Goals reviewed with patient? Yes ?  ?SHORT TERM GOALS: ?  ?STG Name Target Date Goal status  ?1 Comfortable passive flexion to 90 deg  ?Baseline: not tested at eval 08/05/2021 achieved  ?2 Good periscapular activation with shoulder at neutral ?Baseline: will progress as appropriate 08/05/2021 achieved  ?3 Independent with donning/doffing sling ?Baseline:educated at eval 07/21/21 ?  achieved  ?4 Independent with HEP as it has been developed in short term ?Baseline:will progress as appropriate 08/05/2021 achieved  ?         ?         ?         ?  ?LONG TERM GOALS:  ?  ?LTG Name Target Date Goal status  ?1 Good form in reaching Overhead ?Baseline:unable at eval 10/07/2021 INITIAL  ?2 Shoulder strength in dynamometry testing 90% of opposite arm ?Baseline: 10/07/2021 INITIAL  ?3 Pt will verbalize readiness to return to work ?Baseline:will progress as appropriate 10/07/2021 INITIAL  ?4 FOTO to meet goal ?Baseline: will evaluate as PT progresses  10/07/2021 INITIAL  ?5 Independent in long term HEP for continued strengthening ?Baseline: 10/07/2021 INITIAL  ?         ?         ?  ?PLAN: ?PT FREQUENCY: 1-2x/week ?  ?PT DURATION: 12 weeks ?  ?PLANNED INTERVENTIONS: Therapeutic exercises, Therapeutic activity, Neuro Muscular re-education, Patient/Family education, Joint mobilization, Aquatic Therapy, Dry Needling, Electrical stimulation, Spinal mobilization, Cryotherapy, Moist heat, Taping, Vasopneumatic device, and Manual therapy ?  ?PLAN FOR NEXT SESSION: incr AROM challenges, cont to progress flexibility ?  ?Zaheer Wageman C. Ahron Hulbert PT, DPT ?08/24/21 9:03 AM ? ? ? ?  ? ?

## 2021-08-24 NOTE — Progress Notes (Signed)
? ?                            ? ? ?Post Operative Evaluation ?  ? ?Procedure/Date of Surgery: left shoulder arthroscopic labral repair 07/11/21 ? ?Interval History:  ? ?Presents today for follow-up of the above surgery.  She has been working on physical therapy multiple times weekly with Shanda Bumps.  She is working on regaining her range of motion.  Overall her pain continues to improve.  She does have some tightness in the neck and periscapular area. ? ? ?PMH/PSH/Family History/Social History/Meds/Allergies:   ? ?Past Medical History:  ?Diagnosis Date  ? Diabetes mellitus without complication (HCC)   ? Hypertension   ? ?Past Surgical History:  ?Procedure Laterality Date  ? FRACTURE SURGERY    ? rt ring finger  ? SHOULDER ARTHROSCOPY WITH LABRAL REPAIR Left 07/11/2021  ? Procedure: Left SHOULDER ARTHROSCOPY WITH LABRAL REPAIR;  Surgeon: Huel Cote, MD;  Location: [facility] OR;  Service: Orthopedics;  Laterality: Left;  ? ?Social History  ? ?Socioeconomic History  ? Marital status: Single  ?  Spouse name: Not on file  ? Number of children: Not on file  ? Years of education: Not on file  ? Highest education level: Not on file  ?Occupational History  ? Not on file  ?Tobacco Use  ? Smoking status: Former  ?  Types: Cigarettes  ?  Quit date: 12/16/2018  ?  Years since quitting: 2.6  ? Smokeless tobacco: Never  ?Vaping Use  ? Vaping Use: Never used  ?Substance and Sexual Activity  ? Alcohol use: Yes  ?  Comment: social  ? Drug use: Never  ? Sexual activity: Not Currently  ?Other Topics Concern  ? Not on file  ?Social History Narrative  ? Not on file  ? ?Social Determinants of Health  ? ?Financial Resource Strain: Not on file  ?Food Insecurity: Not on file  ?Transportation Needs: Not on file  ?Physical Activity: Not on file  ?Stress: Not on file  ?Social Connections: Not on file  ? ?Family History  ?Family history unknown: Yes  ? ?No Known Allergies ?Current Outpatient Medications  ?Medication Sig Dispense Refill  ? acetaminophen  (TYLENOL) 500 MG tablet Take 1,000 mg by mouth every 6 (six) hours as needed for headache or moderate pain.    ? Albuterol Sulfate (PROAIR RESPICLICK) 108 (90 Base) MCG/ACT AEPB Inhale 2 puffs into the lungs every 6 (six) hours as needed. 1 each 4  ? amLODipine (NORVASC) 5 MG tablet TAKE 1 TABLET(5 MG) BY MOUTH DAILY 30 tablet 0  ? aspirin EC 325 MG tablet Take 1 tablet (325 mg total) by mouth daily. 30 tablet 0  ? cetirizine (ZYRTEC) 10 MG tablet Take 1 tablet (10 mg total) by mouth daily. (Patient not taking: Reported on 07/04/2021) 30 tablet 11  ? fluticasone (FLOVENT HFA) 44 MCG/ACT inhaler Inhale 2 puffs into the lungs in the morning and at bedtime. (Patient not taking: Reported on 07/04/2021) 1 Inhaler 12  ? HYDROmorphone (DILAUDID) 2 MG tablet Take 1 tablet (2 mg total) by mouth every 4 (four) hours as needed for severe pain. 30 tablet 0  ? losartan-hydrochlorothiazide (HYZAAR) 100-25 MG tablet Take 1 tablet by mouth daily. 90 tablet 1  ? Melatonin 1 MG CAPS Take 1 capsule (1 mg total) by mouth at bedtime. 30 capsule 0  ? metFORMIN (GLUCOPHAGE-XR) 500 MG 24 hr tablet Take 1 tablet (500 mg  total) by mouth daily with breakfast. 90 tablet 1  ? oxyCODONE (OXY IR/ROXICODONE) 5 MG immediate release tablet Take 1 tablet (5 mg total) by mouth every 4 (four) hours as needed (severe pain). 20 tablet 0  ? ?No current facility-administered medications for this visit.  ? ?No results found. ? ?Review of Systems:   ?A ROS was performed including pertinent positives and negatives as documented in the HPI. ? ? ?Musculoskeletal Exam:   ? ?Last menstrual period 05/19/2013. ? ?Left shoulder portal incisions are clean dry and intact.  Active range of motion about the left shoulder is to 110 degrees of flexion, passively to approximately 120.  External rotation at the side actively is to approximately 15 degrees and passively to 20.  Internal rotation not tested today.  Stable to flex and extend the left wrist.  2+ radial  pulse. ? ?Imaging:   ? ?None ? ?I personally reviewed and interpreted the radiographs. ? ? ?Assessment:   ?6 weeks status post left shoulder labral repair.  I did discuss that her range of motion is somewhat limited at today's visit compared to what I would expect.  That being said she does have somewhat of a soft endpoint and I do believe that she can continue to work through physical therapy to regain her range of motion.  I would like range of motion to be the focus of her next several appointments.  I did discuss that she could also be a candidate for aquatic therapy given her lower back pain. ? ?Plan :   ? ?-Return to clinic in 2 weeks for motion check ? ? ?I personally saw and evaluated the patient, and participated in the management and treatment plan. ? ?Huel Cote, MD ?Attending Physician, Orthopedic Surgery ? ?This document was dictated using Conservation officer, historic buildings. A reasonable attempt at proof reading has been made to minimize errors. ?

## 2021-08-25 ENCOUNTER — Encounter (HOSPITAL_BASED_OUTPATIENT_CLINIC_OR_DEPARTMENT_OTHER): Payer: Self-pay | Admitting: Physical Therapy

## 2021-08-29 ENCOUNTER — Other Ambulatory Visit: Payer: Self-pay | Admitting: Family Medicine

## 2021-08-30 ENCOUNTER — Ambulatory Visit (HOSPITAL_BASED_OUTPATIENT_CLINIC_OR_DEPARTMENT_OTHER): Payer: 59 | Admitting: Physical Therapy

## 2021-08-30 ENCOUNTER — Other Ambulatory Visit: Payer: Self-pay

## 2021-08-30 DIAGNOSIS — M6281 Muscle weakness (generalized): Secondary | ICD-10-CM

## 2021-08-30 DIAGNOSIS — M25512 Pain in left shoulder: Secondary | ICD-10-CM | POA: Diagnosis not present

## 2021-08-30 DIAGNOSIS — M25612 Stiffness of left shoulder, not elsewhere classified: Secondary | ICD-10-CM

## 2021-08-30 NOTE — Therapy (Signed)
?OUTPATIENT PHYSICAL THERAPY TREATMENT NOTE ? ? ?Patient Name: Judith Pollard ?MRN: [id number] ?DOB:06/26/1965, 56 y.o., female ?Today's Date: 08/31/2021 ? ?PCP: Georganna Skeans, MD ? ? PT End of Session - 08/30/21 1024   ? ? Visit Number 8   ? Number of Visits 25   ? Date for PT Re-Evaluation 10/07/21   ? Authorization Type UHC   ? PT Start Time [phone]   ? PT Stop Time 1019   ? PT Time Calculation (min) 41 min   ? Activity Tolerance Patient tolerated treatment well   ? Behavior During Therapy [facility] for tasks assessed/performed   ? ?  ?  ? ?  ? ? ? ? ? ?Past Medical History:  ?Diagnosis Date  ? Diabetes mellitus without complication (HCC)   ? Hypertension   ? ?Past Surgical History:  ?Procedure Laterality Date  ? FRACTURE SURGERY    ? rt ring finger  ? SHOULDER ARTHROSCOPY WITH LABRAL REPAIR Left 07/11/2021  ? Procedure: Left SHOULDER ARTHROSCOPY WITH LABRAL REPAIR;  Surgeon: Huel Cote, MD;  Location: [facility] OR;  Service: Orthopedics;  Laterality: Left;  ? ?Patient Active Problem List  ? Diagnosis Date Noted  ? Instability of left shoulder joint   ? Asthma, cough variant 08/05/2019  ? Prediabetes 06/27/2019  ? Essential hypertension 06/26/2019  ? 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/26/2019  ? Non-seasonal allergic rhinitis 06/26/2019  ? Morbid obesity (HCC) 06/26/2019  ? Hyperglycemia 06/26/2019  ? ?REFERRING MD:  Huel Cote, MD ? ?REFERRING DIAG: s/p Lt shoulder labral repair ? ?THERAPY DIAG:  ?Acute pain of left shoulder ? ?Muscle weakness (generalized) ? ?Stiffness of left shoulder, not elsewhere classified ? ?PERTINENT HISTORY: pre DM- per pt ? ?PRECAUTIONS: shoulder, DOS 07/11/21 ? ?SUBJECTIVE:  Pt is 7 weeks and 1 days s/p L shoulder ant labral repair.  Pt reports she had some pain after prior Rx.  Pt used ice which helped.  Pt uses ice after HEP also.  Pt states MD was pleased with progress and wants to see pt every 2 weeks.  Pt states MD informed her she can wear sling if she feels she needs it.  Pt wears sling if  she walks long distance.  Pt reports compliance with HEP. ? ?PAIN:  ?Are you having pain? No ?NPRS scale: 0/10 ?Pain location: superior shoulder ? ?Pain description: aching  ?Aggravating factors: constant ache ?Relieving factors: meds ? ? ?OBJECTIVE:  ?   ?Protocol Weeks 4-8 ? 160? FF ?45? ER at side ?160? ABD ?IR behind back to waist ?Cross body ADD at 6wk ?  ?  ?           ?TODAY'S TREATMENT:  ? ?Therapeutic Exercise: ?Reviewed pain levels, HEP compliance, pt presentation, and response to prior Rx. ?Pt performed: ?Supine wand ER 2 x 10 reps ?Supine wand flexion 2 x 10 reps ?Attempted supine shoulder flex AROM with min assist from PT though stopped due to difficulty and pain ?Supine serratus punch with PT assist 2x10 reps ?S/L ER 2x10 reps ?Table slides flexion 2x10 reps & abd 1x10 reps ? ?Manual Therapy:   ?Pt received grade II post and inf jt mobs to improve pain, ROM, stiffness, and to normalize arthrokinematics. ?Pt received L shoulder PROM in flexion, scaption, abd, ER, and IR per protocol ranges and pt tolerance. ? ? ? ? ?  ?PATIENT EDUCATION: ?Education details: anatomy, exercise progression, emphasis on ROM, Appropriate ROM, POC, HEP  ?Person educated: Patient ?Education method: Explanation, Demonstration, Tactile cues, Verbal cues ?  Education comprehension: verbalized understanding, returned demonstration, verbal cues required, tactile cues required, and needs further education ?  ?  ?HOME EXERCISE PROGRAM: ?[id number] ?  ?ASSESSMENT: ?  ?CLINICAL IMPRESSION: ?Pt is tighter and more limited with L shoulder PROM today.  PT tried to perform more active ROM with elevation exercises in supine though pt unable to perform.  She required assistance from PT.  Pt unable to perform supine flexion AROM with PT assistance.  Pt required cuing and instruction for correct form with exercises per protocol.  Pt states she is a little sore but had no increased pain after Rx.  Patient should benefit from cont skilled PT to  address impairments, improve ROM, and improve overall function. ? ?Objective impairments include decreased activity tolerance, decreased mobility, decreased ROM, decreased strength, increased edema, increased muscle spasms, impaired flexibility, impaired sensation, impaired UE functional use, improper body mechanics, and pain. These impairments are limiting patient from cleaning, community activity, meal prep, occupation, laundry, and shopping. Personal factors including DM, 1 comorbidity: pre-DM  are also affecting patient's functional outcome.   ? ?REHAB POTENTIAL: Good ?  ?CLINICAL DECISION MAKING: Stable/uncomplicated ?  ?EVALUATION COMPLEXITY: Low ?  ?  ?GOALS: ?Goals reviewed with patient? Yes ?  ?SHORT TERM GOALS: ?  ?STG Name Target Date Goal status  ?1 Comfortable passive flexion to 90 deg  ?Baseline: not tested at eval 08/05/2021 achieved  ?2 Good periscapular activation with shoulder at neutral ?Baseline: will progress as appropriate 08/05/2021 achieved  ?3 Independent with donning/doffing sling ?Baseline:educated at eval 07/21/21 ?  achieved  ?4 Independent with HEP as it has been developed in short term ?Baseline:will progress as appropriate 08/05/2021 achieved  ?         ?         ?         ?  ?LONG TERM GOALS:  ?  ?LTG Name Target Date Goal status  ?1 Good form in reaching Overhead ?Baseline:unable at eval 10/07/2021 INITIAL  ?2 Shoulder strength in dynamometry testing 90% of opposite arm ?Baseline: 10/07/2021 INITIAL  ?3 Pt will verbalize readiness to return to work ?Baseline:will progress as appropriate 10/07/2021 INITIAL  ?4 FOTO to meet goal ?Baseline: will evaluate as PT progresses 10/07/2021 INITIAL  ?5 Independent in long term HEP for continued strengthening ?Baseline: 10/07/2021 INITIAL  ?         ?         ?  ?PLAN: ?PT FREQUENCY: 1-2x/week ?  ?PT DURATION: 12 weeks ?  ?PLANNED INTERVENTIONS: Therapeutic exercises, Therapeutic activity, Neuro Muscular re-education, Patient/Family education, Joint  mobilization, Aquatic Therapy, Dry Needling, Electrical stimulation, Spinal mobilization, Cryotherapy, Moist heat, Taping, Vasopneumatic device, and Manual therapy ?  ?PLAN FOR NEXT SESSION: Cont per Dr. Serena Croissant protocol.  Progress ROM ?  ?Audie Clear III PT, DPT ?08/31/21 7:05 AM ? ? ? ? ?  ? ?

## 2021-08-31 ENCOUNTER — Encounter (HOSPITAL_BASED_OUTPATIENT_CLINIC_OR_DEPARTMENT_OTHER): Payer: Self-pay | Admitting: Physical Therapy

## 2021-09-01 ENCOUNTER — Ambulatory Visit (HOSPITAL_BASED_OUTPATIENT_CLINIC_OR_DEPARTMENT_OTHER): Payer: 59 | Admitting: Physical Therapy

## 2021-09-06 ENCOUNTER — Ambulatory Visit (HOSPITAL_BASED_OUTPATIENT_CLINIC_OR_DEPARTMENT_OTHER): Payer: 59 | Attending: Orthopaedic Surgery | Admitting: Physical Therapy

## 2021-09-06 ENCOUNTER — Encounter (HOSPITAL_BASED_OUTPATIENT_CLINIC_OR_DEPARTMENT_OTHER): Payer: Self-pay | Admitting: Physical Therapy

## 2021-09-06 DIAGNOSIS — M25612 Stiffness of left shoulder, not elsewhere classified: Secondary | ICD-10-CM | POA: Diagnosis present

## 2021-09-06 DIAGNOSIS — M6281 Muscle weakness (generalized): Secondary | ICD-10-CM | POA: Insufficient documentation

## 2021-09-06 DIAGNOSIS — M25512 Pain in left shoulder: Secondary | ICD-10-CM | POA: Insufficient documentation

## 2021-09-06 NOTE — Therapy (Signed)
?OUTPATIENT PHYSICAL THERAPY TREATMENT NOTE ? ? ?Patient Name: Judith Pollard ?MRN: [id number] ?DOB:Jun 02, 1966, 56 y.o., female ?Today's Date: 09/06/2021 ? ?PCP: Georganna Skeans, MD ? ? PT End of Session - 09/06/21 0846   ? ? Visit Number 9   ? Number of Visits 25   ? Date for PT Re-Evaluation 10/07/21   ? Authorization Type UHC   ? PT Start Time [phone]   ? PT Stop Time 0933   ? PT Time Calculation (min) 49 min   ? Activity Tolerance Patient tolerated treatment well   ? Behavior During Therapy [facility] for tasks assessed/performed   ? ?  ?  ? ?  ? ? ? ? ? ?Past Medical History:  ?Diagnosis Date  ? Diabetes mellitus without complication (HCC)   ? Hypertension   ? ?Past Surgical History:  ?Procedure Laterality Date  ? FRACTURE SURGERY    ? rt ring finger  ? SHOULDER ARTHROSCOPY WITH LABRAL REPAIR Left 07/11/2021  ? Procedure: Left SHOULDER ARTHROSCOPY WITH LABRAL REPAIR;  Surgeon: Huel Cote, MD;  Location: [facility] OR;  Service: Orthopedics;  Laterality: Left;  ? ?Patient Active Problem List  ? Diagnosis Date Noted  ? Instability of left shoulder joint   ? Asthma, cough variant 08/05/2019  ? Prediabetes 06/27/2019  ? Essential hypertension 06/26/2019  ? 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/26/2019  ? Non-seasonal allergic rhinitis 06/26/2019  ? Morbid obesity (HCC) 06/26/2019  ? Hyperglycemia 06/26/2019  ? ?REFERRING MD:  Huel Cote, MD ? ?REFERRING DIAG: s/p Lt shoulder labral repair ? ?THERAPY DIAG:  ?Acute pain of left shoulder ? ?Muscle weakness (generalized) ? ?Stiffness of left shoulder, not elsewhere classified ? ?PERTINENT HISTORY: pre DM- per pt ? ?PRECAUTIONS: shoulder, DOS 07/11/21 ? ?SUBJECTIVE:  feeling okay, no pain really just at the top.  ? ?PAIN:  ?Are you having pain? No ?NPRS scale: 0/10 ?Pain location: superior shoulder ? ?Pain description: aching  ?Aggravating factors: constant ache ?Relieving factors: meds ? ? ?OBJECTIVE:  ? ?4/4 passive shoulder flexion 124, during table slides ?  ?  ?           ?TODAY'S  TREATMENT:  ? ?4/4: ?Pulleys 2 min, manual to Lt upper trap and then 2 more min of pulleys ?Table towel slides- flexion & scaption ?AAROM flexion to active lowering ?AAROM ER in seated with cane ?Sidelying abd to 90- PT tactile cues for scapular mobility; also done in flexion ?Supine flexion with wand ?Moist heat 10 min ? ? ? ? ?  ?PATIENT EDUCATION: ?Education details: importance of stretching & pain tolerances ?Person educated: Patient ?Education method: Explanation, Demonstration, Tactile cues, Verbal cues ?Education comprehension: verbalized understanding, returned demonstration, verbal cues required, tactile cues required, and needs further education ?  ?  ?HOME EXERCISE PROGRAM: ?[id number] ?  ?ASSESSMENT: ?  ?CLINICAL IMPRESSION: ?Able to easily move to 90 in sidelying flexion and abduction with PT assist to scapular mobility. Will begin with head BID to upper trap to decrease tension.  ? ?Objective impairments include decreased activity tolerance, decreased mobility, decreased ROM, decreased strength, increased edema, increased muscle spasms, impaired flexibility, impaired sensation, impaired UE functional use, improper body mechanics, and pain. These impairments are limiting patient from cleaning, community activity, meal prep, occupation, laundry, and shopping. Personal factors including DM, 1 comorbidity: pre-DM  are also affecting patient's functional outcome.   ? ?REHAB POTENTIAL: Good ?  ?CLINICAL DECISION MAKING: Stable/uncomplicated ?  ?EVALUATION COMPLEXITY: Low ?  ?  ?GOALS: ?Goals reviewed with patient? Yes ?  ?  SHORT TERM GOALS: ?  ?STG Name Target Date Goal status  ?1 Comfortable passive flexion to 90 deg  ?Baseline: not tested at eval 08/05/2021 achieved  ?2 Good periscapular activation with shoulder at neutral ?Baseline: will progress as appropriate 08/05/2021 achieved  ?3 Independent with donning/doffing sling ?Baseline:educated at eval 07/21/21 ?  achieved  ?4 Independent with HEP as it has been  developed in short term ?Baseline:will progress as appropriate 08/05/2021 achieved  ?         ?         ?         ?  ?LONG TERM GOALS:  ?  ?LTG Name Target Date Goal status  ?1 Good form in reaching Overhead ?Baseline:unable at eval 10/07/2021 INITIAL  ?2 Shoulder strength in dynamometry testing 90% of opposite arm ?Baseline: 10/07/2021 INITIAL  ?3 Pt will verbalize readiness to return to work ?Baseline:will progress as appropriate 10/07/2021 INITIAL  ?4 FOTO to meet goal ?Baseline: will evaluate as PT progresses 10/07/2021 INITIAL  ?5 Independent in long term HEP for continued strengthening ?Baseline: 10/07/2021 INITIAL  ?         ?         ?  ?PLAN: ?PT FREQUENCY: 1-2x/week ?  ?PT DURATION: 12 weeks ?  ?PLANNED INTERVENTIONS: Therapeutic exercises, Therapeutic activity, Neuro Muscular re-education, Patient/Family education, Joint mobilization, Aquatic Therapy, Dry Needling, Electrical stimulation, Spinal mobilization, Cryotherapy, Moist heat, Taping, Vasopneumatic device, and Manual therapy ?  ?PLAN FOR NEXT SESSION: 8 weeks out, progress scap stab ?  ?Lathon Adan C. Husayn Reim PT, DPT ?09/06/21 9:46 AM ? ? ? ? ?  ? ?

## 2021-09-08 ENCOUNTER — Encounter (HOSPITAL_BASED_OUTPATIENT_CLINIC_OR_DEPARTMENT_OTHER): Payer: Self-pay | Admitting: Physical Therapy

## 2021-09-08 ENCOUNTER — Ambulatory Visit (HOSPITAL_BASED_OUTPATIENT_CLINIC_OR_DEPARTMENT_OTHER): Payer: 59 | Admitting: Physical Therapy

## 2021-09-08 ENCOUNTER — Ambulatory Visit (INDEPENDENT_AMBULATORY_CARE_PROVIDER_SITE_OTHER): Payer: 59 | Admitting: Orthopaedic Surgery

## 2021-09-08 ENCOUNTER — Other Ambulatory Visit (HOSPITAL_BASED_OUTPATIENT_CLINIC_OR_DEPARTMENT_OTHER): Payer: Self-pay

## 2021-09-08 DIAGNOSIS — M25512 Pain in left shoulder: Secondary | ICD-10-CM

## 2021-09-08 DIAGNOSIS — M25612 Stiffness of left shoulder, not elsewhere classified: Secondary | ICD-10-CM

## 2021-09-08 DIAGNOSIS — M25312 Other instability, left shoulder: Secondary | ICD-10-CM

## 2021-09-08 DIAGNOSIS — M6281 Muscle weakness (generalized): Secondary | ICD-10-CM

## 2021-09-08 MED ORDER — METHOCARBAMOL 500 MG PO TABS
500.0000 mg | ORAL_TABLET | Freq: Two times a day (BID) | ORAL | 2 refills | Status: AC
  Filled 2021-09-08: qty 30, 15d supply, fill #0

## 2021-09-08 NOTE — Therapy (Addendum)
?OUTPATIENT PHYSICAL THERAPY TREATMENT NOTE / PROGRESS NOTE ? ? ?Patient Name: Judith Pollard ?MRN: [id number] ?DOB:10-Jan-1966, 56 y.o., female ?Today's Date: 09/08/2021 ? ?PCP: Dorna Mai, MD ? ? PT End of Session - 09/08/21 1400   ? ? Visit Number 10   ? Number of Visits 28   ? Date for PT Re-Evaluation 11/03/21   ? Authorization Type UHC   ? PT Start Time 1062   ? PT Stop Time 6948   ? PT Time Calculation (min) 45 min   ? Activity Tolerance Patient tolerated treatment well   ? Behavior During Therapy [facility] for tasks assessed/performed   ? ?  ?  ? ?  ? ? ? ? ? ?Past Medical History:  ?Diagnosis Date  ? Diabetes mellitus without complication ([place])   ? Hypertension   ? ?Past Surgical History:  ?Procedure Laterality Date  ? FRACTURE SURGERY    ? rt ring finger  ? SHOULDER ARTHROSCOPY WITH LABRAL REPAIR Left 07/11/2021  ? Procedure: Left SHOULDER ARTHROSCOPY WITH LABRAL REPAIR;  Surgeon: Vanetta Mulders, MD;  Location: [facility];  Service: Orthopedics;  Laterality: Left;  ? ?Patient Active Problem List  ? Diagnosis Date Noted  ? Instability of left shoulder joint   ? Asthma, cough variant 08/05/2019  ? Prediabetes 06/27/2019  ? Essential hypertension 06/26/2019  ? 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/26/2019  ? Non-seasonal allergic rhinitis 06/26/2019  ? Morbid obesity ([place]) 06/26/2019  ? Hyperglycemia 06/26/2019  ? ?REFERRING MD:  Vanetta Mulders, MD ? ?REFERRING DIAG: s/p Lt shoulder labral repair ? ?THERAPY DIAG:  ?Acute pain of left shoulder ? ?Muscle weakness (generalized) ? ?Stiffness of left shoulder, not elsewhere classified ? ?PERTINENT HISTORY: pre DM- per pt ? ?PRECAUTIONS: shoulder, DOS 07/11/21 ? ?SUBJECTIVE:  pt is 8 weeks and 3 days post op.  Pt reports improved mobility in L shoulder.  Pt is able to pick up light objects around the house.  Pt states she is able to reach a little more though is limited with reaching activities.  Pt is independent with dressing and reports improved ability to bathe with L UE.  Pt is  unable to put hair up with L UE.  Pt is not applying weight thru L UE.   ?Pt states her neck is bothering her more than anything.  Pt denies any shoulder pain.  Pt reports she had pain after prior Rx.  Pt used heat at home and pain improved approx 45 mins later.  Pt reports compliance with HEP.  ? ?PAIN:  ?Are you having pain? No ?NPRS scale: 0/10, 3/10 worst pain ?Pain location: superior shoulder ? ?Pain description: aching  ?Aggravating factors: constant ache ?Relieving factors: meds ? ? ?OBJECTIVE:  ? ?TODAY'S TREATMENT:  ? ?FOTO:  10 which has improved from 9 initially.  Goal is 73 at visit #15. ? ? ?Therapeutic Exercise: ? ?-L shoulder ROM: ?PROM:  ?flex:  122 ?Abd:  95 deg  ?ER:  27 deg  ?IR:  40 deg ? ?AAROM:   ?Supine flexion:  117 deg ?             ER:  25 ? ?-Reviewed current function, HEP compliance, pain levels, and response to prior Rx.  ?  -Pt performed: ?Pulleys 2 min each in flex and scaption ?table towel slides- flexion & scaption 2x10 reps each ?Supine wand ER 2x10 ?Supine flexion with wand 2x10 reps and in reclined position x 10 reps ?Seated wand ER 2x10 reps ?-Pt received L shoulder  PROM in flexion, abd, scaption, and ER per protocols and pt/tissue tolerance. ?-Assessed ROM ? ?  ?PATIENT EDUCATION: ?Education details: objective findings/progress, HEP, exercise form. ?Person educated: Patient ?Education method: Explanation, Demonstration, Tactile cues, Verbal cues ?Education comprehension: verbalized understanding, returned demonstration, verbal cues required, tactile cues required, and needs further education ?  ?  ?HOME EXERCISE PROGRAM: ?[id number] ?  ?ASSESSMENT: ?  ?CLINICAL IMPRESSION: ?Pt is progressing slowly but steadily with shoulder ROM.  She has tightness and limitations with L shoulder ROM, especially ER, though is improving in all directions.  Pt is improving with ADLs including performing self care activities as evidenced by subjective reports.  She is very limited with reaching  activities.  Pt has improved shoulder pain and presents to Rx today without shoulder pain.  Pt has met all STG's.  Pt responded well to Rx.  Pt should benefit from cont skilled PT services per protocol to address ongoing goals, improve ROM, and assist in restoring desired level of function.   ? ?Objective impairments include decreased activity tolerance, decreased mobility, decreased ROM, decreased strength, increased edema, increased muscle spasms, impaired flexibility, impaired sensation, impaired UE functional use, improper body mechanics, and pain. These impairments are limiting patient from cleaning, community activity, meal prep, occupation, laundry, and shopping. Personal factors including DM, 1 comorbidity: pre-DM  are also affecting patient's functional outcome.   ? ?REHAB POTENTIAL: Good ?  ?CLINICAL DECISION MAKING: Stable/uncomplicated ?  ?EVALUATION COMPLEXITY: Low ?  ?  ?GOALS: ?Goals reviewed with patient? Yes ?  ?SHORT TERM GOALS: ?  ?STG Name Target Date Goal status  ?1 Comfortable passive flexion to 90 deg  ?Baseline: not tested at eval 08/05/2021 achieved  ?2 Good periscapular activation with shoulder at neutral ?Baseline: will progress as appropriate 08/05/2021 achieved  ?3 Independent with donning/doffing sling ?Baseline:educated at eval 07/21/21 ?  achieved  ?4 Independent with HEP as it has been developed in short term ?Baseline:will progress as appropriate 08/05/2021 achieved  ?         ?         ?         ?  ?LONG TERM GOALS:  ?  ?LTG Name Target Date Goal status  ?1 Good form in reaching Overhead ?Baseline:unable at eval 11/03/2021 INITIAL  ?2 Shoulder strength in dynamometry testing 90% of opposite arm ?Baseline: 11/03/2021 INITIAL  ?3 Pt will verbalize readiness to return to work ?Baseline:will progress as appropriate 11/03/2021 INITIAL  ?4 FOTO to meet goal ?Baseline: will evaluate as PT progresses 11/03/2021 INITIAL  ?5 Independent in long term HEP for continued strengthening ?Baseline: 11/03/2021  INITIAL  ?         ?         ?  ?PLAN: ?PT FREQUENCY:  2-3x/week ?  ?PT DURATION: 8 weeks ?  ?PLANNED INTERVENTIONS: Therapeutic exercises, Therapeutic activity, Neuro Muscular re-education, Patient/Family education, Joint mobilization, Aquatic Therapy, Dry Needling, Electrical stimulation, Spinal mobilization, Cryotherapy, Moist heat, Taping, Vasopneumatic device, and Manual therapy ?  ?PLAN FOR NEXT SESSION: Cont to focus on ROM.  Cont per Dr. Eddie Dibbles surgical protocol.  ? ?  ?Selinda Michaels III PT, DPT ?09/08/21 11:27 PM ? ? ? ? ? ?  ? ?

## 2021-09-08 NOTE — Progress Notes (Signed)
? ?                            ? ? ?Post Operative Evaluation ?  ? ?Procedure/Date of Surgery: left shoulder arthroscopic labral repair 07/11/21 ? ?Interval History:  ? ? ?Presents today for follow-up of the above surgery.  She has continues to work with physical therapy on both active and passive range of motion.  Overall she does feel much better although motion remains limited.  Her pain is much improved and function is much improved although that being said the shoulder is somewhat stiff. ? ? ?PMH/PSH/Family History/Social History/Meds/Allergies:   ? ?Past Medical History:  ?Diagnosis Date  ? Diabetes mellitus without complication ([facility])   ? Hypertension   ? ?Past Surgical History:  ?Procedure Laterality Date  ? FRACTURE SURGERY    ? rt ring finger  ? SHOULDER ARTHROSCOPY WITH LABRAL REPAIR Left 07/11/2021  ? Procedure: Left SHOULDER ARTHROSCOPY WITH LABRAL REPAIR;  Surgeon: Huel Cote, MD;  Location: [facility];  Service: Orthopedics;  Laterality: Left;  ? ?Social History  ? ?Socioeconomic History  ? Marital status: Single  ?  Spouse name: Not on file  ? Number of children: Not on file  ? Years of education: Not on file  ? Highest education level: Not on file  ?Occupational History  ? Not on file  ?Tobacco Use  ? Smoking status: Former  ?  Types: Cigarettes  ?  Quit date: 12/16/2018  ?  Years since quitting: 2.7  ? Smokeless tobacco: Never  ?Vaping Use  ? Vaping Use: Never used  ?Substance and Sexual Activity  ? Alcohol use: Yes  ?  Comment: social  ? Drug use: Never  ? Sexual activity: Not Currently  ?Other Topics Concern  ? Not on file  ?Social History Narrative  ? Not on file  ? ?Social Determinants of Health  ? ?Financial Resource Strain: Not on file  ?Food Insecurity: Not on file  ?Transportation Needs: Not on file  ?Physical Activity: Not on file  ?Stress: Not on file  ?Social Connections: Not on file  ? ?Family History  ?Family history unknown: Yes  ? ?No Known Allergies ?Current Outpatient Medications   ?Medication Sig Dispense Refill  ? methocarbamol (ROBAXIN) 500 MG tablet Take 1 tablet (500 mg total) by mouth in the morning and at bedtime. 30 tablet 2  ? acetaminophen (TYLENOL) 500 MG tablet Take 1,000 mg by mouth every 6 (six) hours as needed for headache or moderate pain.    ? Albuterol Sulfate (PROAIR RESPICLICK) 108 (90 Base) MCG/ACT AEPB Inhale 2 puffs into the lungs every 6 (six) hours as needed. 1 each 4  ? amLODipine (NORVASC) 5 MG tablet TAKE 1 TABLET(5 MG) BY MOUTH DAILY 30 tablet 0  ? aspirin EC 325 MG tablet Take 1 tablet (325 mg total) by mouth daily. 30 tablet 0  ? cetirizine (ZYRTEC) 10 MG tablet Take 1 tablet (10 mg total) by mouth daily. (Patient not taking: Reported on 07/04/2021) 30 tablet 11  ? fluticasone (FLOVENT HFA) 44 MCG/ACT inhaler Inhale 2 puffs into the lungs in the morning and at bedtime. (Patient not taking: Reported on 07/04/2021) 1 Inhaler 12  ? HYDROmorphone (DILAUDID) 2 MG tablet Take 1 tablet (2 mg total) by mouth every 4 (four) hours as needed for severe pain. 30 tablet 0  ? losartan-hydrochlorothiazide (HYZAAR) 100-25 MG tablet Take 1 tablet by mouth daily. 90 tablet 1  ?  Melatonin 1 MG CAPS Take 1 capsule (1 mg total) by mouth at bedtime. 30 capsule 0  ? metFORMIN (GLUCOPHAGE-XR) 500 MG 24 hr tablet Take 1 tablet (500 mg total) by mouth daily with breakfast. 90 tablet 1  ? oxyCODONE (OXY IR/ROXICODONE) 5 MG immediate release tablet Take 1 tablet (5 mg total) by mouth every 4 (four) hours as needed (severe pain). 20 tablet 0  ? ?No current facility-administered medications for this visit.  ? ?No results found. ? ?Review of Systems:   ?A ROS was performed including pertinent positives and negatives as documented in the HPI. ? ? ?Musculoskeletal Exam:   ? ?Last menstrual period 05/19/2013. ? ?Left shoulder portal incisions are clean dry and intact.  Active range of motion about the left shoulder is to 120 degrees of flexion.  External rotation at the side actively is to  approximately 15 degrees and passively to 20.  Internal rotation not tested today.  Stable to flex and extend the left wrist.  2+ radial pulse. ? ?Imaging:   ? ?None ? ?I personally reviewed and interpreted the radiographs. ? ? ?Assessment:   ?8 weeks status post left shoulder labral repair.  I did discuss that her range of motion is somewhat limited at today's visit compared to what I would expect.  As result I would like to recommend an ultrasound-guided injection of the left shoulder today in order to improve her range of motion with physical therapy.  Given the fact that she is also experiencing trapezial spasms I will plan on sending her Robaxin as well for these.  She should continue aggressive physical therapy following this injection to work on her range of motion ?Plan :   ? ?-Return to clinic in 4 weeks ? ?I personally saw and evaluated the patient, and participated in the management and treatment plan. ? ?Huel Cote, MD ?Attending Physician, Orthopedic Surgery ? ?This document was dictated using Conservation officer, historic buildings. A reasonable attempt at proof reading has been made to minimize errors. ?

## 2021-09-11 NOTE — Therapy (Signed)
?OUTPATIENT PHYSICAL THERAPY TREATMENT NOTE ? ? ?Patient Name: Judith Pollard ?MRN: [id number] ?DOB:1966-06-01, 56 y.o., female ?Today's Date: 09/12/2021 ? ?PCP: Georganna Skeans, MD ? ? PT End of Session - 09/12/21 1019   ? ? Visit Number 11   ? Number of Visits 28   ? Date for PT Re-Evaluation 11/03/21   ? Authorization Type UHC   ? PT Start Time [phone]   ? PT Stop Time 1023   ? PT Time Calculation (min) 45 min   ? Activity Tolerance Patient tolerated treatment well   ? Behavior During Therapy [facility] for tasks assessed/performed   ? ?  ?  ? ?  ? ? ? ? ? ? ?Past Medical History:  ?Diagnosis Date  ? Diabetes mellitus without complication ([facility])   ? Hypertension   ? ?Past Surgical History:  ?Procedure Laterality Date  ? FRACTURE SURGERY    ? rt ring finger  ? SHOULDER ARTHROSCOPY WITH LABRAL REPAIR Left 07/11/2021  ? Procedure: Left SHOULDER ARTHROSCOPY WITH LABRAL REPAIR;  Surgeon: Huel Cote, MD;  Location: [facility];  Service: Orthopedics;  Laterality: Left;  ? ?Patient Active Problem List  ? Diagnosis Date Noted  ? Instability of left shoulder joint   ? Asthma, cough variant 08/05/2019  ? Prediabetes 06/27/2019  ? Essential hypertension 06/26/2019  ? 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/26/2019  ? Non-seasonal allergic rhinitis 06/26/2019  ? Morbid obesity ([facility]) 06/26/2019  ? Hyperglycemia 06/26/2019  ? ?REFERRING MD:  Huel Cote, MD ? ?REFERRING DIAG: s/p Lt shoulder labral repair ? ?THERAPY DIAG:  ?Acute pain of left shoulder ? ?Muscle weakness (generalized) ? ?Stiffness of left shoulder, not elsewhere classified ? ?PERTINENT HISTORY: pre DM- per pt ? ?PRECAUTIONS: shoulder, DOS 07/11/21 ? ?SUBJECTIVE:  Pt is 9 weeks s/p L shoulder ant labral repair.  Pt saw MD and received an injection.  MD also prescribed mm relaxers.  Pt reports improved stiffness after the injection.  Pt reports MD gave her a new exercise, wall walks AAROM in flexion.  Pt is independent with dressing and reports improved ability to bathe with L UE.  Pt  is unable to put hair up with L UE.  Pt reports compliance with HEP.  Pt states she is already performing supine serratus punches, seated wand ER, and submax isometrics at home.    ? ? ? ?PAIN:  ?Are you having pain? No ?NPRS scale: 2/10 ?Pain location: superior shoulder ? ?Pain description: aching  ?Aggravating factors: constant ache ?Relieving factors: meds ? ? ?OBJECTIVE:  ? ?TODAY'S TREATMENT:  ? ?Therapeutic Exercise: ? ?-L shoulder AAROM:   ?Supine flexion:  123 deg ?             ER:  29 ? ?-Reviewed current function, HEP compliance, pain levels, and response to prior Rx.  ?  -Pt performed: ?Pulleys 2 min each in flex and scaption ?Supine wand ER x10 ?Supine wand flexion in reclined position 2 x 10 reps ?Seated wand ER 2x10 reps ?supine serratus punch 2x10 reps ?supine ABC x1 reps ?supine ER AROM x 10 reps ?-See pt education below ? ? ?Manual Therapy:   ?Pt received grade II post and inf jt mobs to improve pain, ROM, stiffness, and to normalize arthrokinematics ?STM to L UT ?-Pt received L shoulder PROM in flexion, abd, scaption, and ER per protocols and pt/tissue tolerance. ?-Assessed ROM ? ?  ?PATIENT EDUCATION: ?Education details:  Reviewed HEP--Pt states she is already performing supine serratus punches, seated wand ER, and  submax isometrics at home.  Updated HEP with supine shoulder ABC and gave a handout.  Educated pt in correct form and appropriate frequency.  objective findings/progress, HEP, exercise form. ?Person educated: Patient ?Education method: Explanation, Demonstration, Tactile cues, Verbal cues ?Education comprehension: verbalized understanding, returned demonstration, verbal cues required, tactile cues required, and needs further education ?  ?  ?HOME EXERCISE PROGRAM: ?[id number] ?Added to HEP: - Supine Shoulder Alphabet  - 1 x daily - 7 x weekly - 1 reps ?  ?ASSESSMENT: ?  ?CLINICAL IMPRESSION: ?Pt is progressing slowly but steadily with shoulder ROM.  Pt received an injection at MD visit  and reports improved mobility.  She continues to have tightness and limitations with L shoulder ROM, especially ER.  Pt demonstrates improved flexion PROM based on visual observation and improved flexion and ER AAROM based on goniometric measurements.  Pt demonstrates improved ease with supine wand flexion with head elevated.  She requires cuing for correct form with ER AAROM.  Pt responded well to Rx having no c/o's after Rx.  Pt should benefit from cont skilled PT services per protocol to address ongoing goals, improve ROM, and assist in restoring desired level of function.   ? ? ? ?Objective impairments include decreased activity tolerance, decreased mobility, decreased ROM, decreased strength, increased edema, increased muscle spasms, impaired flexibility, impaired sensation, impaired UE functional use, improper body mechanics, and pain. These impairments are limiting patient from cleaning, community activity, meal prep, occupation, laundry, and shopping. Personal factors including DM, 1 comorbidity: pre-DM  are also affecting patient's functional outcome.   ? ?REHAB POTENTIAL: Good ?  ?CLINICAL DECISION MAKING: Stable/uncomplicated ?  ?EVALUATION COMPLEXITY: Low ?  ?  ?GOALS: ?Goals reviewed with patient? Yes ?  ?SHORT TERM GOALS: ?  ?STG Name Target Date Goal status  ?1 Comfortable passive flexion to 90 deg  ?Baseline: not tested at eval 08/05/2021 achieved  ?2 Good periscapular activation with shoulder at neutral ?Baseline: will progress as appropriate 08/05/2021 achieved  ?3 Independent with donning/doffing sling ?Baseline:educated at eval 07/21/21 ?  achieved  ?4 Independent with HEP as it has been developed in short term ?Baseline:will progress as appropriate 08/05/2021 achieved  ?         ?         ?         ?  ?LONG TERM GOALS:  ?  ?LTG Name Target Date Goal status  ?1 Good form in reaching Overhead ?Baseline:unable at eval 11/03/2021 INITIAL  ?2 Shoulder strength in dynamometry testing 90% of opposite  arm ?Baseline: 11/03/2021 INITIAL  ?3 Pt will verbalize readiness to return to work ?Baseline:will progress as appropriate 11/03/2021 INITIAL  ?4 FOTO to meet goal ?Baseline: will evaluate as PT progresses 11/03/2021 INITIAL  ?5 Independent in long term HEP for continued strengthening ?Baseline: 11/03/2021 INITIAL  ?         ?         ?  ?PLAN: ?PT FREQUENCY:  2-3x/week ?  ?PT DURATION: 8 weeks ?  ?PLANNED INTERVENTIONS: Therapeutic exercises, Therapeutic activity, Neuro Muscular re-education, Patient/Family education, Joint mobilization, Aquatic Therapy, Dry Needling, Electrical stimulation, Spinal mobilization, Cryotherapy, Moist heat, Taping, Vasopneumatic device, and Manual therapy ?  ?PLAN FOR NEXT SESSION: Cont to focus on ROM.  Cont per Dr. Serena Croissant surgical protocol.  ? ?  ?Audie Clear III PT, DPT ?09/12/21 10:44 PM ? ? ? ? ? ? ?  ? ?

## 2021-09-12 ENCOUNTER — Ambulatory Visit (HOSPITAL_BASED_OUTPATIENT_CLINIC_OR_DEPARTMENT_OTHER): Payer: 59 | Admitting: Physical Therapy

## 2021-09-12 ENCOUNTER — Encounter (HOSPITAL_BASED_OUTPATIENT_CLINIC_OR_DEPARTMENT_OTHER): Payer: Self-pay | Admitting: Physical Therapy

## 2021-09-12 DIAGNOSIS — M25512 Pain in left shoulder: Secondary | ICD-10-CM | POA: Diagnosis not present

## 2021-09-12 DIAGNOSIS — M6281 Muscle weakness (generalized): Secondary | ICD-10-CM

## 2021-09-12 DIAGNOSIS — M25612 Stiffness of left shoulder, not elsewhere classified: Secondary | ICD-10-CM

## 2021-09-13 ENCOUNTER — Encounter (HOSPITAL_BASED_OUTPATIENT_CLINIC_OR_DEPARTMENT_OTHER): Payer: Self-pay | Admitting: Physical Therapy

## 2021-09-14 ENCOUNTER — Encounter (HOSPITAL_BASED_OUTPATIENT_CLINIC_OR_DEPARTMENT_OTHER): Payer: Self-pay | Admitting: Physical Therapy

## 2021-09-14 ENCOUNTER — Ambulatory Visit (HOSPITAL_BASED_OUTPATIENT_CLINIC_OR_DEPARTMENT_OTHER): Payer: 59 | Admitting: Physical Therapy

## 2021-09-14 DIAGNOSIS — M6281 Muscle weakness (generalized): Secondary | ICD-10-CM

## 2021-09-14 DIAGNOSIS — M25512 Pain in left shoulder: Secondary | ICD-10-CM | POA: Diagnosis not present

## 2021-09-14 DIAGNOSIS — M25612 Stiffness of left shoulder, not elsewhere classified: Secondary | ICD-10-CM

## 2021-09-14 NOTE — Therapy (Signed)
?OUTPATIENT PHYSICAL THERAPY TREATMENT NOTE ? ? ?Patient Name: Judith Pollard ?MRN: [id number] ?DOB:1966/04/18, 56 y.o., female ?Today's Date: 09/14/2021 ? ?PCP: Dorna Mai, MD ? ? PT End of Session - 09/14/21 1042   ? ? Visit Number 12   ? Number of Visits 28   ? Date for PT Re-Evaluation 11/03/21   ? Authorization Type UHC   ? PT Start Time 1017   ? PT Stop Time 1102   ? PT Time Calculation (min) 45 min   ? Activity Tolerance Patient tolerated treatment well   ? Behavior During Therapy [facility] for tasks assessed/performed   ? ?  ?  ? ?  ? ? ? ? ? ? ? ?Past Medical History:  ?Diagnosis Date  ? Diabetes mellitus without complication ([place])   ? Hypertension   ? ?Past Surgical History:  ?Procedure Laterality Date  ? FRACTURE SURGERY    ? rt ring finger  ? SHOULDER ARTHROSCOPY WITH LABRAL REPAIR Left 07/11/2021  ? Procedure: Left SHOULDER ARTHROSCOPY WITH LABRAL REPAIR;  Surgeon: Vanetta Mulders, MD;  Location: [place];  Service: Orthopedics;  Laterality: Left;  ? ?Patient Active Problem List  ? Diagnosis Date Noted  ? Instability of left shoulder joint   ? Asthma, cough variant 08/05/2019  ? Prediabetes 06/27/2019  ? Essential hypertension 06/26/2019  ? 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/26/2019  ? Non-seasonal allergic rhinitis 06/26/2019  ? Morbid obesity ([place]) 06/26/2019  ? Hyperglycemia 06/26/2019  ? ?REFERRING MD:  Vanetta Mulders, MD ? ?REFERRING DIAG: s/p Lt shoulder labral repair ? ?THERAPY DIAG:  ?Acute pain of left shoulder ? ?Muscle weakness (generalized) ? ?Stiffness of left shoulder, not elsewhere classified ? ?PERTINENT HISTORY: pre DM- per pt ? ?PRECAUTIONS: shoulder, DOS 07/11/21 ? ?SUBJECTIVE:  Pt is 9 weeks and 2 days s/p L shoulder ant labral repair.  Pt saw MD last week and received an injection.  MD also prescribed mm relaxers.  Pt reports improved stiffness after the injection.  Pt reports MD gave her a new exercise, wall walks AAROM in flexion.  Pt reports that's the most painful exercise.  Pt is independent  with dressing and reports improved ability to bathe with L UE.  Pt is unable to put hair up with L UE and is limited with reaching behind back.  Pt unable to reach overhead.  Pt reports compliance with HEP.  Pt reports compliance with HEP including seated wand ER and submax isometrics at home.  Pt reports she had increased UT pain after prior Rx though no increased shoulder pain.  ? ? ? ?PAIN:  ?Are you having pain? No ?NPRS scale: 2/10 ?Pain location: superior shoulder ? ?Pain description: aching  ?Aggravating factors: constant ache ?Relieving factors: meds ? ? ?OBJECTIVE:  ? ?TODAY'S TREATMENT:  ? ?Therapeutic Exercise: ? ? ?-Reviewed current function, HEP compliance, pain levels, and response to prior Rx.  ?  -Pt performed: ?Supine wand flexion in reclined position x 10 reps ?Seated wand ER 2x10 reps ?supine serratus punch 2x10 reps ?supine ABC x1 reps ?supine ER AROM 2 x 10 reps ?S/L ER x10 reps ?Supine flexion AROM 2x10 reps ?Standing wall walks in flexion x 5 reps.  Reviewed and instructed in form. ?-See pt education below ? ? ?Manual Therapy:   ?Pt received grade II post and inf jt mobs to improve pain, ROM, stiffness, and to normalize arthrokinematics ?STM to L UT ?-Pt received L shoulder PROM in flexion, abd, scaption, ER, and IR per protocols and pt/tissue tolerance. ? ? ?  ?  PATIENT EDUCATION: ?Education details:  Reviewed HEP.  Updated HEP with supine shoulder flexion AROM and gave a handout.  Educated pt in correct form and appropriate frequency.  HEP, exercise form. ?Person educated: Patient ?Education method: Explanation, Demonstration, Tactile cues, Verbal cues ?Education comprehension: verbalized understanding, returned demonstration, verbal cues required, tactile cues required, and needs further education ?  ?  ?HOME EXERCISE PROGRAM: ?[id number] ?Added to HEP: - Supine Shoulder Flexion AROM  - 1-2 x daily - 7 x weekly - 2 sets - 10 reps ?  ?ASSESSMENT: ?  ?CLINICAL IMPRESSION: ?Pt is progressing  slowly but steadily with shoulder ROM.  She continues to have tightness and limitations with L shoulder ROM, especially ER.  Pt was able to perform supine flexion AROM today independently.  PT updated HEP with supine flexion AROM.  Pt is improving with flexion ROM in gravity minimized position and is also performing supine wand flexion with head elevated with improved ease.  She requires cuing for correct form with ER AAROM.  Pt has difficulty and pain with standing wall walks in flexion.  She required instruction in correct form with standing flexin AAROM.  Pt has improved tightness in UT.  She responded well to Rx having no c/o's after Rx.  Pt should benefit from cont skilled PT services per protocol to address ongoing goals, improve ROM, and assist in restoring desired level of function.   ? ? ?Objective impairments include decreased activity tolerance, decreased mobility, decreased ROM, decreased strength, increased edema, increased muscle spasms, impaired flexibility, impaired sensation, impaired UE functional use, improper body mechanics, and pain. These impairments are limiting patient from cleaning, community activity, meal prep, occupation, laundry, and shopping. Personal factors including DM, 1 comorbidity: pre-DM  are also affecting patient's functional outcome.   ? ?REHAB POTENTIAL: Good ?  ?CLINICAL DECISION MAKING: Stable/uncomplicated ?  ?EVALUATION COMPLEXITY: Low ?  ?  ?GOALS: ?Goals reviewed with patient? Yes ?  ?SHORT TERM GOALS: ?  ?STG Name Target Date Goal status  ?1 Comfortable passive flexion to 90 deg  ?Baseline: not tested at eval 08/05/2021 achieved  ?2 Good periscapular activation with shoulder at neutral ?Baseline: will progress as appropriate 08/05/2021 achieved  ?3 Independent with donning/doffing sling ?Baseline:educated at eval 07/21/21 ?  achieved  ?4 Independent with HEP as it has been developed in short term ?Baseline:will progress as appropriate 08/05/2021 achieved  ?         ?          ?         ?  ?LONG TERM GOALS:  ?  ?LTG Name Target Date Goal status  ?1 Good form in reaching Overhead ?Baseline:unable at eval 11/03/2021 INITIAL  ?2 Shoulder strength in dynamometry testing 90% of opposite arm ?Baseline: 11/03/2021 INITIAL  ?3 Pt will verbalize readiness to return to work ?Baseline:will progress as appropriate 11/03/2021 INITIAL  ?4 FOTO to meet goal ?Baseline: will evaluate as PT progresses 11/03/2021 INITIAL  ?5 Independent in long term HEP for continued strengthening ?Baseline: 11/03/2021 INITIAL  ?         ?         ?  ?PLAN: ?PT FREQUENCY:  2-3x/week ?  ?PT DURATION: 8 weeks ?  ?PLANNED INTERVENTIONS: Therapeutic exercises, Therapeutic activity, Neuro Muscular re-education, Patient/Family education, Joint mobilization, Aquatic Therapy, Dry Needling, Electrical stimulation, Spinal mobilization, Cryotherapy, Moist heat, Taping, Vasopneumatic device, and Manual therapy ?  ?PLAN FOR NEXT SESSION: Cont to focus on ROM.  Cont per Dr. Eddie Dibbles surgical  protocol.  ? ?  ?Selinda Michaels III PT, DPT ?09/14/21 6:57 PM ? ? ? ? ? ? ? ?  ? ?

## 2021-09-15 ENCOUNTER — Encounter (HOSPITAL_BASED_OUTPATIENT_CLINIC_OR_DEPARTMENT_OTHER): Payer: Self-pay | Admitting: Physical Therapy

## 2021-09-20 ENCOUNTER — Ambulatory Visit (HOSPITAL_BASED_OUTPATIENT_CLINIC_OR_DEPARTMENT_OTHER): Payer: 59 | Admitting: Physical Therapy

## 2021-09-20 ENCOUNTER — Encounter (HOSPITAL_BASED_OUTPATIENT_CLINIC_OR_DEPARTMENT_OTHER): Payer: Self-pay | Admitting: Physical Therapy

## 2021-09-20 DIAGNOSIS — M6281 Muscle weakness (generalized): Secondary | ICD-10-CM

## 2021-09-20 DIAGNOSIS — M25612 Stiffness of left shoulder, not elsewhere classified: Secondary | ICD-10-CM

## 2021-09-20 DIAGNOSIS — M25512 Pain in left shoulder: Secondary | ICD-10-CM | POA: Diagnosis not present

## 2021-09-20 NOTE — Therapy (Signed)
?OUTPATIENT PHYSICAL THERAPY TREATMENT NOTE ? ? ?Patient Name: Judith Pollard ?MRN: [id number] ?DOB:1965-08-06, 56 y.o., female ?Today's Date: 09/20/2021 ? ?PCP: Georganna Skeans, MD ? ? PT End of Session - 09/20/21 0809   ? ? Visit Number 13   ? Number of Visits 28   ? Date for PT Re-Evaluation 11/03/21   ? Authorization Type UHC   ? PT Start Time [phone]   ? PT Stop Time 0849   ? PT Time Calculation (min) 43 min   ? Activity Tolerance Patient tolerated treatment well   ? Behavior During Therapy [facility] for tasks assessed/performed   ? ?  ?  ? ?  ? ? ? ? ? ? ? ? ?Past Medical History:  ?Diagnosis Date  ? Diabetes mellitus without complication ([facility])   ? Hypertension   ? ?Past Surgical History:  ?Procedure Laterality Date  ? FRACTURE SURGERY    ? rt ring finger  ? SHOULDER ARTHROSCOPY WITH LABRAL REPAIR Left 07/11/2021  ? Procedure: Left SHOULDER ARTHROSCOPY WITH LABRAL REPAIR;  Surgeon: Huel Cote, MD;  Location: [facility];  Service: Orthopedics;  Laterality: Left;  ? ?Patient Active Problem List  ? Diagnosis Date Noted  ? Instability of left shoulder joint   ? Asthma, cough variant 08/05/2019  ? Prediabetes 06/27/2019  ? Essential hypertension 06/26/2019  ? 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/26/2019  ? Non-seasonal allergic rhinitis 06/26/2019  ? Morbid obesity (H[facility]) 06/26/2019  ? Hyperglycemia 06/26/2019  ? ?REFERRING MD:  Huel Cote, MD ? ?REFERRING DIAG: s/p Lt shoulder labral repair ? ?THERAPY DIAG:  ?Acute pain of left shoulder ? ?Muscle weakness (generalized) ? ?Stiffness of left shoulder, not elsewhere classified ? ?PERTINENT HISTORY: pre DM- per pt ? ?PRECAUTIONS: shoulder, DOS 07/11/21 ? ?SUBJECTIVE:  Pt is 10 weeks and 1 day s/p L shoulder ant labral repair.  Pt reports compliance with HEP.  Pt denies any adverse effects after prior Rx.  Pt states she must have slept on her shoulder last night.  She had a little pain, but not much when walking up.  Pt denies any adverse effects after prior Rx.  Pt states her neck  hasn't been hurting.  Pt reports improved mobility in L shoulder.  Pt reports she is able to pick up light things like clothes with L UE.  Pt is independent with dressing and reports improved ability to bathe with L UE.  Pt is able to reach behind her head more for hair care.   ? ? ?PAIN:  ?Are you having pain? yes ?NPRS scale: 1-2/10 ?Pain location: superior shoulder ? ?Pain description: aching  ?Aggravating factors: constant ache ?Relieving factors: meds ? ? ?OBJECTIVE:  ? ?TODAY'S TREATMENT:  ? ?Therapeutic Exercise: ? ?-Reviewed current function, HEP compliance, pain levels, and response to prior Rx.  ?  -Pt performed: ?Pulleys 2x10 in flexion and scaption ?Supine flexion AROM x 10 reps and in reclined position 2 x 10 reps ?supine serratus punch AROM x 10 reps and with 1# 2x10 reps ?supine ABC x1 reps ?S/L ER 2x10 reps ?S/L abduction 2x10 reps ?Prone extension AROM to neutral 2x10 ?Prone row to neutral 2x10 reps ?Scapular retraction with YTB 2x10 reps ? ? ?Manual Therapy:   ?Pt received grade II post and inf jt mobs to improve pain, ROM, stiffness, and to normalize arthrokinematics ?-Pt received L shoulder PROM in flexion, abd, scaption, ER, and IR per protocols and pt/tissue tolerance. ? ? ?  ?PATIENT EDUCATION: ?Education details:  POC,  HEP, exercise  form. ?Person educated: Patient ?Education method: Explanation, Demonstration, Tactile cues, Verbal cues ?Education comprehension: verbalized understanding, returned demonstration, verbal cues required, tactile cues required, and needs further education ?  ?  ?HOME EXERCISE PROGRAM: ?[id number] ?Added to HEP: - Supine Shoulder Flexion AROM  - 1-2 x daily - 7 x weekly - 2 sets - 10 reps ?  ?ASSESSMENT: ?  ?CLINICAL IMPRESSION: ?Pt demonstrates improved ROM t/o L shoulder with A/AA/PROM.  She had reduced tightness with shoulder PROM.  Pt is able to perform supine flexion AROM easier and pt performed in a reclined position.  PT progressed exercises per protocol and pt  performed exercises well with good tolerance.  She responded well to Rx having no increased pain after Rx.  Pt should benefit from cont skilled PT services per protocol to address ongoing goals, improve ROM, and assist in restoring desired level of function.   ? ?Objective impairments include decreased activity tolerance, decreased mobility, decreased ROM, decreased strength, increased edema, increased muscle spasms, impaired flexibility, impaired sensation, impaired UE functional use, improper body mechanics, and pain. These impairments are limiting patient from cleaning, community activity, meal prep, occupation, laundry, and shopping. Personal factors including DM, 1 comorbidity: pre-DM  are also affecting patient's functional outcome.   ? ?REHAB POTENTIAL: Good ?  ?CLINICAL DECISION MAKING: Stable/uncomplicated ?  ?EVALUATION COMPLEXITY: Low ?  ?  ?GOALS: ?Goals reviewed with patient? Yes ?  ?SHORT TERM GOALS: ?  ?STG Name Target Date Goal status  ?1 Comfortable passive flexion to 90 deg  ?Baseline: not tested at eval 08/05/2021 achieved  ?2 Good periscapular activation with shoulder at neutral ?Baseline: will progress as appropriate 08/05/2021 achieved  ?3 Independent with donning/doffing sling ?Baseline:educated at eval 07/21/21 ?  achieved  ?4 Independent with HEP as it has been developed in short term ?Baseline:will progress as appropriate 08/05/2021 achieved  ?         ?         ?         ?  ?LONG TERM GOALS:  ?  ?LTG Name Target Date Goal status  ?1 Good form in reaching Overhead ?Baseline:unable at eval 11/03/2021 INITIAL  ?2 Shoulder strength in dynamometry testing 90% of opposite arm ?Baseline: 11/03/2021 INITIAL  ?3 Pt will verbalize readiness to return to work ?Baseline:will progress as appropriate 11/03/2021 INITIAL  ?4 FOTO to meet goal ?Baseline: will evaluate as PT progresses 11/03/2021 INITIAL  ?5 Independent in long term HEP for continued strengthening ?Baseline: 11/03/2021 INITIAL  ?         ?         ?   ?PLAN: ?PT FREQUENCY:  2-3x/week ?  ?PT DURATION: 8 weeks ?  ?PLANNED INTERVENTIONS: Therapeutic exercises, Therapeutic activity, Neuro Muscular re-education, Patient/Family education, Joint mobilization, Aquatic Therapy, Dry Needling, Electrical stimulation, Spinal mobilization, Cryotherapy, Moist heat, Taping, Vasopneumatic device, and Manual therapy ?  ?PLAN FOR NEXT SESSION: Cont to focus on ROM.  Cont per Dr. Serena Croissant surgical protocol.  ? ?  ?Audie Clear III PT, DPT ?09/20/21 2:08 PM ? ? ? ? ? ? ? ? ?  ? ?

## 2021-09-22 ENCOUNTER — Ambulatory Visit (HOSPITAL_BASED_OUTPATIENT_CLINIC_OR_DEPARTMENT_OTHER): Payer: 59 | Admitting: Physical Therapy

## 2021-09-22 ENCOUNTER — Encounter (HOSPITAL_BASED_OUTPATIENT_CLINIC_OR_DEPARTMENT_OTHER): Payer: Self-pay | Admitting: Physical Therapy

## 2021-09-22 DIAGNOSIS — M6281 Muscle weakness (generalized): Secondary | ICD-10-CM

## 2021-09-22 DIAGNOSIS — M25612 Stiffness of left shoulder, not elsewhere classified: Secondary | ICD-10-CM

## 2021-09-22 DIAGNOSIS — M25512 Pain in left shoulder: Secondary | ICD-10-CM | POA: Diagnosis not present

## 2021-09-22 NOTE — Therapy (Signed)
?OUTPATIENT PHYSICAL THERAPY TREATMENT NOTE ? ? ?Patient Name: Judith Pollard ?MRN: [id number] ?DOB:1966/05/15, 56 y.o., female ?Today's Date: 09/22/2021 ? ?PCP: Georganna Skeans, MD ? ? PT End of Session - 09/22/21 1111   ? ? Visit Number 14   ? Number of Visits 28   ? Date for PT Re-Evaluation 11/03/21   ? Authorization Type UHC   ? PT Start Time 1106   ? PT Stop Time 1147   ? PT Time Calculation (min) 41 min   ? Activity Tolerance Patient tolerated treatment well   ? Behavior During Therapy [facility] for tasks assessed/performed   ? ?  ?  ? ?  ? ? ? ? ? ? ? ? ?Past Medical History:  ?Diagnosis Date  ? Diabetes mellitus without complication (HCC)   ? Hypertension   ? ?Past Surgical History:  ?Procedure Laterality Date  ? FRACTURE SURGERY    ? rt ring finger  ? SHOULDER ARTHROSCOPY WITH LABRAL REPAIR Left 07/11/2021  ? Procedure: Left SHOULDER ARTHROSCOPY WITH LABRAL REPAIR;  Surgeon: Huel Cote, MD;  Location: [facility];  Service: Orthopedics;  Laterality: Left;  ? ?Patient Active Problem List  ? Diagnosis Date Noted  ? Instability of left shoulder joint   ? Asthma, cough variant 08/05/2019  ? Prediabetes 06/27/2019  ? Essential hypertension 06/26/2019  ? 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/26/2019  ? Non-seasonal allergic rhinitis 06/26/2019  ? Morbid obesity ([facility]) 06/26/2019  ? Hyperglycemia 06/26/2019  ? ?REFERRING MD:  Huel Cote, MD ? ?REFERRING DIAG: s/p Lt shoulder labral repair ? ?THERAPY DIAG:  ?Acute pain of left shoulder ? ?Muscle weakness (generalized) ? ?Stiffness of left shoulder, not elsewhere classified ? ?PERTINENT HISTORY: pre DM- per pt ? ?PRECAUTIONS: shoulder, DOS 07/11/21 ? ?SUBJECTIVE:  Pt is 10 weeks and 3 days s/p L shoulder ant labral repair.  Pt reports compliance with HEP.  Pt denies any adverse effects after prior Rx.  Pt states she thinks she can do more.  She had a little pain, but not much when walking up.  Pt denies any adverse effects after prior Rx.  Pt states her neck hasn't been hurting.   Pt reports improved mobility in L shoulder.  Pt reports she is able to pick up light things like clothes with L UE and able to pick up a bag of groceries.  Pt is independent with dressing and reports improved ability to bathe with L UE.  Pt is able to reach behind her head more for hair care.   ? ? ?PAIN:  ?Are you having pain? yes ?NPRS scale: 1-2/10 ?Pain location: superior shoulder ? ?Pain description: aching  ?Aggravating factors: constant ache ?Relieving factors: meds ? ? ?OBJECTIVE:  ? ?TODAY'S TREATMENT:  ? ?L shoulder PROM:  flex:  147 deg, abd: 114, ER:  37 deg ?L shoulder AROM:  (supine) flexion:  128 deg, ER:  31 deg ?Therapeutic Exercise: ? ?-Reviewed current function, HEP compliance, pain levels, and response to prior Rx.  ?  -Pt performed: ?Pulleys 2x10 in flexion and scaption ?Supine flexion AROM in reclined position 2 x 10 reps ?supine serratus punch with 1# 3x10 reps ?supine ABC x1 reps with 1# ?S/L ER 2x10 reps ?S/L abduction 2x10 reps ?Prone extension to neutral AROM x10 and with 1# x 10 reps ?Prone row to neutral 2x10 reps ?Scapular retraction with YTB 2x10 reps ? ? ?Manual Therapy:   ?Pt received grade II post and inf jt mobs to improve pain, ROM, stiffness, and to  normalize arthrokinematics ?-Pt received L shoulder PROM in flexion, abd, scaption, ER, and IR per protocols and pt/tissue tolerance. ? ? ?  ?PATIENT EDUCATION: ?Education details:  POC,  HEP, exercise form. ?Person educated: Patient ?Education method: Explanation, Demonstration, Tactile cues, Verbal cues ?Education comprehension: verbalized understanding, returned demonstration, verbal cues required, tactile cues required, and needs further education ?  ?  ?HOME EXERCISE PROGRAM: ?[id number] ? ?  ?ASSESSMENT: ?  ?CLINICAL IMPRESSION: ?Pt is progressing with L shoulder ROM and has reduced tightness with shoulder PROM.  She demonstrates improvements in all planes.  She continues to be very tight and limited with ER and is slowly  improving with ER ROM.   PT is progressing exercises per protocol including AROM and lightly resisted exercises.  Pt tolerated progression of exercises well and performed exercises well with cuing for correct form.  Pt responded well to Rx reporting no increased pain after Rx.  Pt should benefit from cont skilled PT services per protocol to address ongoing goals, improve ROM, and assist in restoring desired level of function.   ? ?Objective impairments include decreased activity tolerance, decreased mobility, decreased ROM, decreased strength, increased edema, increased muscle spasms, impaired flexibility, impaired sensation, impaired UE functional use, improper body mechanics, and pain. These impairments are limiting patient from cleaning, community activity, meal prep, occupation, laundry, and shopping. Personal factors including DM, 1 comorbidity: pre-DM  are also affecting patient's functional outcome.   ? ?REHAB POTENTIAL: Good ?  ?CLINICAL DECISION MAKING: Stable/uncomplicated ?  ?EVALUATION COMPLEXITY: Low ?  ?  ?GOALS: ?Goals reviewed with patient? Yes ?  ?SHORT TERM GOALS: ?  ?STG Name Target Date Goal status  ?1 Comfortable passive flexion to 90 deg  ?Baseline: not tested at eval 08/05/2021 achieved  ?2 Good periscapular activation with shoulder at neutral ?Baseline: will progress as appropriate 08/05/2021 achieved  ?3 Independent with donning/doffing sling ?Baseline:educated at eval 07/21/21 ?  achieved  ?4 Independent with HEP as it has been developed in short term ?Baseline:will progress as appropriate 08/05/2021 achieved  ?         ?         ?         ?  ?LONG TERM GOALS:  ?  ?LTG Name Target Date Goal status  ?1 Good form in reaching Overhead ?Baseline:unable at eval 11/03/2021 INITIAL  ?2 Shoulder strength in dynamometry testing 90% of opposite arm ?Baseline: 11/03/2021 INITIAL  ?3 Pt will verbalize readiness to return to work ?Baseline:will progress as appropriate 11/03/2021 INITIAL  ?4 FOTO to meet  goal ?Baseline: will evaluate as PT progresses 11/03/2021 INITIAL  ?5 Independent in long term HEP for continued strengthening ?Baseline: 11/03/2021 INITIAL  ?         ?         ?  ?PLAN: ?PT FREQUENCY:  2-3x/week ?  ?PT DURATION: 8 weeks ?  ?PLANNED INTERVENTIONS: Therapeutic exercises, Therapeutic activity, Neuro Muscular re-education, Patient/Family education, Joint mobilization, Aquatic Therapy, Dry Needling, Electrical stimulation, Spinal mobilization, Cryotherapy, Moist heat, Taping, Vasopneumatic device, and Manual therapy ?  ?PLAN FOR NEXT SESSION: Cont to focus on ROM, especially ER.  Cont per Dr. Serena Croissant surgical protocol.  ? ?  ?Audie Clear III PT, DPT ?09/22/21 6:55 PM ? ? ? ? ? ? ? ? ? ?  ? ?

## 2021-09-22 NOTE — Therapy (Signed)
?OUTPATIENT PHYSICAL THERAPY TREATMENT NOTE ? ? ?Patient Name: Judith Pollard ?MRN: [id number] ?DOB:Jul 12, 1965, 56 y.o., female ?Today's Date: 09/22/2021 ? ?PCP: Georganna Skeans, MD ? ? PT End of Session - 09/22/21 1111   ? ? Visit Number 14   ? Number of Visits 28   ? Date for PT Re-Evaluation 11/03/21   ? Authorization Type UHC   ? PT Start Time 1106   ? PT Stop Time 1147   ? PT Time Calculation (min) 41 min   ? Activity Tolerance Patient tolerated treatment well   ? Behavior During Therapy [facility] for tasks assessed/performed   ? ?  ?  ? ?  ? ? ? ? ? ? ? ? ?Past Medical History:  ?Diagnosis Date  ? Diabetes mellitus without complication (HCC)   ? Hypertension   ? ?Past Surgical History:  ?Procedure Laterality Date  ? FRACTURE SURGERY    ? rt ring finger  ? SHOULDER ARTHROSCOPY WITH LABRAL REPAIR Left 07/11/2021  ? Procedure: Left SHOULDER ARTHROSCOPY WITH LABRAL REPAIR;  Surgeon: Huel Cote, MD;  Location: [facility] OR;  Service: Orthopedics;  Laterality: Left;  ? ?Patient Active Problem List  ? Diagnosis Date Noted  ? Instability of left shoulder joint   ? Asthma, cough variant 08/05/2019  ? Prediabetes 06/27/2019  ? Essential hypertension 06/26/2019  ? 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/26/2019  ? Non-seasonal allergic rhinitis 06/26/2019  ? Morbid obesity (HCC) 06/26/2019  ? Hyperglycemia 06/26/2019  ? ?REFERRING MD:  Huel Cote, MD ? ?REFERRING DIAG: s/p Lt shoulder labral repair ? ?THERAPY DIAG:  ?Acute pain of left shoulder ? ?Muscle weakness (generalized) ? ?Stiffness of left shoulder, not elsewhere classified ? ?PERTINENT HISTORY: pre DM- per pt ? ?PRECAUTIONS: shoulder, DOS 07/11/21 ? ?SUBJECTIVE:   Pt is 10 weeks and 3 days s/p L shoulder ant labral repair.  Pt reports compliance with HEP.  Pt denies any adverse effects after prior Rx.  Pt states she thinks she can do more.  She had a little pain, but not much when walking up.  Pt denies any adverse effects after prior Rx.  Pt states her neck hasn't been hurting.   Pt reports improved mobility in L shoulder.  Pt reports she is able to pick up light things like clothes with L UE and able to pick up a bag of groceries.  Pt is independent with dressing and reports improved ability to bathe with L UE.  Pt is able to reach behind her head more for hair care.   ? ? ?PAIN:  ?Are you having pain? yes ?NPRS scale: 1-2/10 ?Pain location: superior shoulder ? ?Pain description: aching  ?Aggravating factors: constant ache ?Relieving factors: meds ? ? ?OBJECTIVE:  ? ?TODAY'S TREATMENT:  ? ?Therapeutic Exercise: ? ?-Reviewed current function, HEP compliance, pain levels, and response to prior Rx.  ?  -Pt performed: ?Pulleys 2x10 in flexion and scaption ?Supine flexion AROM in reclined position 2 x 10 reps ?supine serratus punch with 1# 3x10 reps ?supine ABC x1 reps with 1# ?S/L ER 2x10 reps ?S/L abduction 2x10 reps ?Prone extension AROM to neutral 1x10 and with 1# x10 reps ?Prone row to neutral 2x10 reps ?Scapular retraction with YTB 2x10 reps ? ? ?Manual Therapy:   ?Pt received grade II post and inf jt mobs to improve pain, ROM, stiffness, and to normalize arthrokinematics ?-Pt received L shoulder PROM in flexion, abd, scaption, ER, and IR per protocols and pt/tissue tolerance. ? ? ?  ?PATIENT EDUCATION: ?Education details:  POC,  HEP, exercise form. ?Person educated: Patient ?Education method: Explanation, Demonstration, Tactile cues, Verbal cues ?Education comprehension: verbalized understanding, returned demonstration, verbal cues required, tactile cues required, and needs further education ?  ?  ?HOME EXERCISE PROGRAM: ?[id number] ?Added to HEP: - Supine Shoulder Flexion AROM  - 1-2 x daily - 7 x weekly - 2 sets - 10 reps ?  ?ASSESSMENT: ?  ?CLINICAL IMPRESSION: ?Pt is progressing with L shoulder ROM and has reduced tightness with shoulder PROM.  She demonstrates improvements in all planes.  She continues to be very tight and limited with ER and is slowly improving with ER ROM.   PT is  progressing exercises per protocol including AROM and lightly resisted exercises.  Pt tolerated progression of exercises well and performed exercises well with cuing for correct form.  Pt responded well to Rx reporting no increased pain after Rx.  Pt should benefit from cont skilled PT services per protocol to address ongoing goals, improve ROM, and assist in restoring desired level of function  ? ?Objective impairments include decreased activity tolerance, decreased mobility, decreased ROM, decreased strength, increased edema, increased muscle spasms, impaired flexibility, impaired sensation, impaired UE functional use, improper body mechanics, and pain. These impairments are limiting patient from cleaning, community activity, meal prep, occupation, laundry, and shopping. Personal factors including DM, 1 comorbidity: pre-DM  are also affecting patient's functional outcome.   ? ?REHAB POTENTIAL: Good ?  ?CLINICAL DECISION MAKING: Stable/uncomplicated ?  ?EVALUATION COMPLEXITY: Low ?  ?  ?GOALS: ?Goals reviewed with patient? Yes ?  ?SHORT TERM GOALS: ?  ?STG Name Target Date Goal status  ?1 Comfortable passive flexion to 90 deg  ?Baseline: not tested at eval 08/05/2021 achieved  ?2 Good periscapular activation with shoulder at neutral ?Baseline: will progress as appropriate 08/05/2021 achieved  ?3 Independent with donning/doffing sling ?Baseline:educated at eval 07/21/21 ?  achieved  ?4 Independent with HEP as it has been developed in short term ?Baseline:will progress as appropriate 08/05/2021 achieved  ?         ?         ?         ?  ?LONG TERM GOALS:  ?  ?LTG Name Target Date Goal status  ?1 Good form in reaching Overhead ?Baseline:unable at eval 11/03/2021 INITIAL  ?2 Shoulder strength in dynamometry testing 90% of opposite arm ?Baseline: 11/03/2021 INITIAL  ?3 Pt will verbalize readiness to return to work ?Baseline:will progress as appropriate 11/03/2021 INITIAL  ?4 FOTO to meet goal ?Baseline: will evaluate as PT  progresses 11/03/2021 INITIAL  ?5 Independent in long term HEP for continued strengthening ?Baseline: 11/03/2021 INITIAL  ?         ?         ?  ?PLAN: ?PT FREQUENCY:  2-3x/week ?  ?PT DURATION: 8 weeks ?  ?PLANNED INTERVENTIONS: Therapeutic exercises, Therapeutic activity, Neuro Muscular re-education, Patient/Family education, Joint mobilization, Aquatic Therapy, Dry Needling, Electrical stimulation, Spinal mobilization, Cryotherapy, Moist heat, Taping, Vasopneumatic device, and Manual therapy ?  ?PLAN FOR NEXT SESSION:  Cont to focus on ROM, especially ER.  Cont per Dr. Serena Croissant surgical protocol.  ? ?  ?Audie Clear III PT, DPT ?09/22/21 7:00 PM ? ? ? ? ? ? ? ? ? ?  ? ?

## 2021-09-26 ENCOUNTER — Telehealth: Payer: Self-pay | Admitting: Orthopaedic Surgery

## 2021-09-26 NOTE — Telephone Encounter (Signed)
Received paperwork from the front desk from patient. $50.00 cash,paperwork from [employer] to [place] today ?

## 2021-09-27 ENCOUNTER — Ambulatory Visit (HOSPITAL_BASED_OUTPATIENT_CLINIC_OR_DEPARTMENT_OTHER): Payer: 59 | Admitting: Physical Therapy

## 2021-09-27 ENCOUNTER — Telehealth: Payer: Self-pay | Admitting: Orthopaedic Surgery

## 2021-09-27 NOTE — Telephone Encounter (Signed)
[employer] forms received. To [employer]. ?

## 2021-09-28 NOTE — Therapy (Signed)
?OUTPATIENT PHYSICAL THERAPY TREATMENT NOTE ? ? ?Patient Name: Judith AdjutantDonna L Pelot ?MRN: [id number] ?DOB:1965/08/17, 56 y.o., female ?Today's Date: 09/30/2021 ? ?PCP: Georganna SkeansWilson, Amelia, MD ? ? PT End of Session - 09/29/21 1024   ? ? Visit Number 15   ? Number of Visits 28   ? Date for PT Re-Evaluation 11/03/21   ? Authorization Type UHC   ? PT Start Time 1018   ? PT Stop Time 1105   ? PT Time Calculation (min) 47 min   ? Activity Tolerance Patient tolerated treatment well   ? Behavior [facility] for tasks assessed/performed   ? ?  ?  ? ?  ? ? ? ? ? ? ? ? ? ?Past Medical History:  ?Diagnosis Date  ? Diabetes mellitus without complication (HCC)   ? Hypertension   ? ?Past Surgical History:  ?Procedure Laterality Date  ? FRACTURE SURGERY    ? rt ring finger  ? SHOULDER ARTHROSCOPY WITH LABRAL REPAIR Left 07/11/2021  ? Procedure: Left SHOULDER ARTHROSCOPY WITH LABRAL REPAIR;  Surgeon: Huel CoteBokshan, Steven, MD;  Location: [facility] OR;  Service: Orthopedics;  Laterality: Left;  ? ?Patient Active Problem List  ? Diagnosis Date Noted  ? Instability of left shoulder joint   ? Asthma, cough variant 08/05/2019  ? Prediabetes 06/27/2019  ? Essential hypertension 06/26/2019  ? 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/26/2019  ? Non-seasonal allergic rhinitis 06/26/2019  ? Morbid obesity (HCC) 06/26/2019  ? Hyperglycemia 06/26/2019  ? ?REFERRING MD:  Huel CoteBokshan, Steven, MD ? ?REFERRING DIAG: s/p Lt shoulder labral repair ? ?THERAPY DIAG:  ?Acute pain of left shoulder ? ?Muscle weakness (generalized) ? ?Stiffness of left shoulder, not elsewhere classified ? ?PERTINENT HISTORY: pre DM- per pt ? ?PRECAUTIONS: shoulder, DOS 07/11/21 ? ?SUBJECTIVE:  Pt is 11 weeks and 3 days s/p L shoulder ant labral repair.  Pt reports compliance with HEP.  Pt denies any adverse effects after prior Rx.  Pt states her neck hasn't been hurting.  Pt reports improved mobility in L shoulder.  Pt reports she is able to pick up light things like clothes with L UE and able to pick up a  bag of groceries.  Pt is independent with dressing and reports improved ability to bathe with L UE.  Pt is able to reach behind her head more for hair care.  Pt states her motion is improving and she is able to reach better.  ? ? ? ?PAIN:  ?Are you having pain? No ?NPRS scale: 0/10 ?Pain location: superior shoulder ? ?Pain description: aching  ?Aggravating factors: constant ache ?Relieving factors: meds ? ? ?OBJECTIVE:  ? ?TODAY'S TREATMENT:  ? ?L shoulder PROM:  flex:  149 deg, abd: 114, ER:  50 deg ?L shoulder AROM:  (supine) flexion:  131 deg, ER:  45 deg ?    ? ?Therapeutic Exercise: ? ?-Reviewed current function, HEP compliance, pain levels, and response to prior Rx.  ?  -Pt performed: ?Pulleys x20 in flexion and scaption ?Supine flexion AROM in reclined position 2 x 10 reps ?supine serratus punch with 2# 3x10 reps ?supine ABC x1 reps with 1# ?S/L ER 2x10 reps ?Prone extension to neutral with 1# 2 x 10 reps ?Prone row with to neutral 2x10 reps ?Scapular retraction with YTB x10 and with RTB x 10 reps ?-Assessed ROM ?-Pt received an updated HEP handout.  Educated pt in correct form and appropriate frequency.   ? ? ?Manual Therapy:   ?Pt received grade II post and inf  jt mobs to improve pain, ROM, stiffness, and to normalize arthrokinematics ?-Pt received L shoulder PROM in flexion, abd, scaption, ER, and IR per protocols and pt/tissue tolerance. ? ? ?  ?PATIENT EDUCATION: ?Education details:  Updated HEP and gave pt a HEP handout.  Educated pt in appropriate frequency of strengthening per protocol.  Instructed pt she should not have pain with HEP.  exercise form. ?Person educated: Patient ?Education method: Explanation, Demonstration, Tactile cues, Verbal cues, handout ?Education comprehension: verbalized understanding, returned demonstration, verbal cues required, tactile cues required, and needs further education ?  ?  ?HOME EXERCISE PROGRAM: ?[id number] ? ?Added resistance to ABC  ?- Single Arm Serratus Punches   - 1 x daily - 3 x weekly - 3 sets - 10 reps ?- Prone Shoulder Extension - Single Arm  - 1 x daily - 3 x weekly - 3 sets - 10 reps ?- Standing Shoulder Row with Anchored Resistance  - 1 x daily - 3 x weekly - 3 sets - 10 reps ?- Sidelying Shoulder External Rotation  - 1 x daily - 5-6 x weekly - 2 sets - 10 reps ? ?  ?ASSESSMENT: ?  ?CLINICAL IMPRESSION: ?Pt is improving with ROM and tightness t/o L shoulder.  She has been tight in ER though demonstrates much improved ER AROM and PROM today. PT is progressing exercises per protocol and pt is tolerating progression well.  Pt demonstrates improved strength as evidenced by performance of exercises with resistance and increased AROM.  She does have difficulty with prone row requiring instruction and tactile cues for correct form.  Attempted weight though removed weight due to form.  PT updated HEP and gave pt a HEP handout.  Pt demonstrates good understanding of HEP.  Pt responded well to Rx having no pain after Rx.   Pt should benefit from cont skilled PT services per protocol to address ongoing goals, improve ROM, and assist in restoring desired level of function.   ? ?Objective impairments include decreased activity tolerance, decreased mobility, decreased ROM, decreased strength, increased edema, increased muscle spasms, impaired flexibility, impaired sensation, impaired UE functional use, improper body mechanics, and pain. These impairments are limiting patient from cleaning, community activity, meal prep, occupation, laundry, and shopping. Personal factors including DM, 1 comorbidity: pre-DM  are also affecting patient's functional outcome.   ? ?REHAB POTENTIAL: Good ?  ?CLINICAL DECISION MAKING: Stable/uncomplicated ?  ?EVALUATION COMPLEXITY: Low ?  ?  ?GOALS: ? ?  ?SHORT TERM GOALS: ?  ?STG Name Target Date Goal status  ?1 Comfortable passive flexion to 90 deg  ?Baseline: not tested at eval 08/05/2021 achieved  ?2 Good periscapular activation with shoulder at  neutral ?Baseline: will progress as appropriate 08/05/2021 achieved  ?3 Independent with donning/doffing sling ?Baseline:educated at eval 07/21/21 ?  achieved  ?4 Independent with HEP as it has been developed in short term ?Baseline:will progress as appropriate 08/05/2021 achieved  ?         ?         ?         ?  ?LONG TERM GOALS:  ?  ?LTG Name Target Date Goal status  ?1 Good form in reaching Overhead ?Baseline:unable at eval 11/03/2021 INITIAL  ?2 Shoulder strength in dynamometry testing 90% of opposite arm ?Baseline: 11/03/2021 INITIAL  ?3 Pt will verbalize readiness to return to work ?Baseline:will progress as appropriate 11/03/2021 INITIAL  ?4 FOTO to meet goal ?Baseline: will evaluate as PT progresses 11/03/2021 INITIAL  ?5 Independent  in long term HEP for continued strengthening ?Baseline: 11/03/2021 INITIAL  ?         ?         ?  ?PLAN: ?PT FREQUENCY:  2-3x/week ?  ?PT DURATION: 8 weeks ?  ?PLANNED INTERVENTIONS: Therapeutic exercises, Therapeutic activity, Neuro Muscular re-education, Patient/Family education, Joint mobilization, Aquatic Therapy, Dry Needling, Electrical stimulation, Spinal mobilization, Cryotherapy, Moist heat, Taping, Vasopneumatic device, and Manual therapy ?  ?PLAN FOR NEXT SESSION: Cont to focus on ROM, especially ER.  Cont per Dr. Serena Croissant surgical protocol.  ? ?  ?Audie Clear III PT, DPT ?09/30/21 11:32 AM ? ? ? ? ? ? ? ? ? ? ?  ? ?

## 2021-09-29 ENCOUNTER — Encounter (HOSPITAL_BASED_OUTPATIENT_CLINIC_OR_DEPARTMENT_OTHER): Payer: Self-pay | Admitting: Physical Therapy

## 2021-09-29 ENCOUNTER — Ambulatory Visit (HOSPITAL_BASED_OUTPATIENT_CLINIC_OR_DEPARTMENT_OTHER): Payer: 59 | Admitting: Physical Therapy

## 2021-09-29 DIAGNOSIS — M6281 Muscle weakness (generalized): Secondary | ICD-10-CM

## 2021-09-29 DIAGNOSIS — M25612 Stiffness of left shoulder, not elsewhere classified: Secondary | ICD-10-CM

## 2021-09-29 DIAGNOSIS — M25512 Pain in left shoulder: Secondary | ICD-10-CM

## 2021-09-30 ENCOUNTER — Telehealth: Payer: Self-pay

## 2021-09-30 NOTE — Telephone Encounter (Signed)
Message from the [facility] requesting medical records. Phone# [phone] ext [phone] and fax [phone] ?

## 2021-10-03 ENCOUNTER — Telehealth: Payer: Self-pay | Admitting: Orthopaedic Surgery

## 2021-10-03 NOTE — Telephone Encounter (Signed)
09/08/21 ov note faxed to [facility] [phone] ?

## 2021-10-05 ENCOUNTER — Ambulatory Visit (INDEPENDENT_AMBULATORY_CARE_PROVIDER_SITE_OTHER): Payer: 59 | Admitting: Orthopaedic Surgery

## 2021-10-05 DIAGNOSIS — M25312 Other instability, left shoulder: Secondary | ICD-10-CM

## 2021-10-05 NOTE — Therapy (Signed)
?OUTPATIENT PHYSICAL THERAPY TREATMENT NOTE ? ? ?Patient Name: Judith AdjutantDonna L Brafford ?MRN: [id number] ?DOB:03/02/1966, 56 y.o., female ?Today's Date: 10/06/2021 ? ?PCP: Georganna SkeansWilson, Amelia, MD ? ? PT End of Session - 10/06/21 [id number]   ? ? Visit Number 16   ? Number of Visits 28   ? Date for PT Re-Evaluation 11/03/21   ? Authorization Type UHC   ? PT Start Time [phone]   ? PT Stop Time 0849   ? PT Time Calculation (min) 45 min   ? Activity Tolerance Patient tolerated treatment well   ? [facility] for tasks assessed/performed   ? ?  ?  ? ?  ? ? ? ? ? ? ? ? ? ? ?Past Medical History:  ?Diagnosis Date  ? Diabetes mellitus without complication (HCC)   ? Hypertension   ? ?Past Surgical History:  ?Procedure Laterality Date  ? FRACTURE SURGERY    ? rt ring finger  ? SHOULDER ARTHROSCOPY WITH LABRAL REPAIR Left 07/11/2021  ? Procedure: Left SHOULDER ARTHROSCOPY WITH LABRAL REPAIR;  Surgeon: Huel CoteBokshan, Steven, MD;  Location: [facility] OR;  Service: Orthopedics;  Laterality: Left;  ? ?Patient Active Problem List  ? Diagnosis Date Noted  ? Instability of left shoulder joint   ? Asthma, cough variant 08/05/2019  ? Prediabetes 06/27/2019  ? Essential hypertension 06/26/2019  ? 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/26/2019  ? Non-seasonal allergic rhinitis 06/26/2019  ? Morbid obesity (HCC) 06/26/2019  ? Hyperglycemia 06/26/2019  ? ?REFERRING MD:  Huel CoteBokshan, Steven, MD ? ?REFERRING DIAG: s/p Lt shoulder labral repair ? ?THERAPY DIAG:  ?Acute pain of left shoulder ? ?Muscle weakness (generalized) ? ?Stiffness of left shoulder, not elsewhere classified ? ?PERTINENT HISTORY: pre DM- per pt ? ?PRECAUTIONS: shoulder, DOS 07/11/21 ? ?SUBJECTIVE:  Pt is 12 weeks and 3 days s/p L shoulder ant labral repair.  Pt denies any adverse effects after prior Rx.  Pt saw MD yesterday and he was pleased with progress.  Pt states MD put her out of work for another 6 weeks and will return to MD in 4 weeks.  Pt denies pain currently.  Pt states she had a little pain after  prior Rx though pain didn't last.  Pt reports compliance with HEP and had difficulty with prone row.    ? ?Pt reports improved mobility in L shoulder.  Pt reports she is able to pick up light things like clothes with L UE and able to pick up a bag of groceries.  Pt is independent with dressing and reports improved ability to bathe with L UE.  Pt states her motion is improving and she is able to reach better.  ? ? ? ?PAIN:  ?Are you having pain? No ?NPRS scale: 0/10 ?Pain location: shoulder ? ?Pain description: aching  ?Aggravating factors: constant ache ?Relieving factors: meds ? ? ?OBJECTIVE:  ? ?TODAY'S TREATMENT:  ? ?L shoulder PROM:  flex:  149 deg, abd: 114, ER:  50 deg ?L shoulder AROM:  (supine) flexion:  131 deg, ER:  45 deg ?    ? ?Therapeutic Exercise: ? ?-Reviewed current function, HEP compliance, pain levels, and response to prior Rx.  ?  -Pt performed: ?Supine serratus punch with 2# 3x10 reps ?supine ABC x1 reps with 1# ?S/L ER 2x10 reps ?Prone extension to neutral with 1# 2 x 10 reps ?Prone row with to neutral 2x10 reps ?Supine rhythmic stabs at 90 deg flexion 3x30 sec ?Standing Jobe's flexion 2x10 ?Standing scaption 2x10 ?-Pt received an  updated HEP handout.  Educated pt in correct form and appropriate frequency.   ? ? ?Manual Therapy:   ?Pt received grade II post and inf jt mobs to improve pain, ROM, stiffness, and to normalize arthrokinematics ?-Pt received L shoulder PROM in flexion, abd, scaption, ER, and IR per protocols and pt/tissue tolerance. ? ? ?  ?PATIENT EDUCATION: ?Education details:  Updated HEP and gave pt a HEP handout.  Educated pt in appropriate frequency of strengthening per protocol.  Instructed pt she should not have pain with HEP.  Instructed pt to hold on prone row.  exercise form. ?Person educated: Patient ?Education method: Explanation, Demonstration, Tactile cues, Verbal cues, handout ?Education comprehension: verbalized understanding, returned demonstration, verbal cues  required, tactile cues required, and needs further education ?  ?  ?HOME EXERCISE PROGRAM: ?[id number] ? ?Added to HEP ?- Standing Shoulder Flexion to 90 Degrees  - 1 x daily - 5-6 x weekly - 2 sets - 10 reps ?- Standing Shoulder Scaption  - 1 x daily - 5-6 x weekly - 2 sets - 10 reps ? ?  ?ASSESSMENT: ?  ?CLINICAL IMPRESSION: ?Pt is improving with ROM and tightness t/o L shoulder.  She continues to have tightness in ER though is improving.  Pt continues to improve with AROM of L shoulder and is progressing with exercises per protocol.  She does have difficulty with prone row requiring instruction and tactile cues for correct form.  Pt able to perform standing jobe's flexion and standing scaption to 90 deg well without shoulder hike. PT updated HEP and gave pt a HEP handout.  Pt demonstrates good understanding of HEP.  Pt responded well to Rx having no pain after Rx.   Pt should benefit from cont skilled PT services per protocol to address ongoing goals, improve ROM, and assist in restoring desired level of function.   ? ? ?Objective impairments include decreased activity tolerance, decreased mobility, decreased ROM, decreased strength, increased edema, increased muscle spasms, impaired flexibility, impaired sensation, impaired UE functional use, improper body mechanics, and pain. These impairments are limiting patient from cleaning, community activity, meal prep, occupation, laundry, and shopping. Personal factors including DM, 1 comorbidity: pre-DM  are also affecting patient's functional outcome.   ? ?REHAB POTENTIAL: Good ?  ?CLINICAL DECISION MAKING: Stable/uncomplicated ?  ?EVALUATION COMPLEXITY: Low ?  ?  ?GOALS: ? ?  ?SHORT TERM GOALS: ?  ?STG Name Target Date Goal status  ?1 Comfortable passive flexion to 90 deg  ?Baseline: not tested at eval 08/05/2021 achieved  ?2 Good periscapular activation with shoulder at neutral ?Baseline: will progress as appropriate 08/05/2021 achieved  ?3 Independent with  donning/doffing sling ?Baseline:educated at eval 07/21/21 ?  achieved  ?4 Independent with HEP as it has been developed in short term ?Baseline:will progress as appropriate 08/05/2021 achieved  ?         ?         ?         ?  ?LONG TERM GOALS:  ?  ?LTG Name Target Date Goal status  ?1 Good form in reaching Overhead ?Baseline:unable at eval 11/03/2021 INITIAL  ?2 Shoulder strength in dynamometry testing 90% of opposite arm ?Baseline: 11/03/2021 INITIAL  ?3 Pt will verbalize readiness to return to work ?Baseline:will progress as appropriate 11/03/2021 INITIAL  ?4 FOTO to meet goal ?Baseline: will evaluate as PT progresses 11/03/2021 INITIAL  ?5 Independent in long term HEP for continued strengthening ?Baseline: 11/03/2021 INITIAL  ?         ?         ?  ?  PLAN: ?PT FREQUENCY:  2-3x/week ?  ?PT DURATION: 8 weeks ?  ?PLANNED INTERVENTIONS: Therapeutic exercises, Therapeutic activity, Neuro Muscular re-education, Patient/Family education, Joint mobilization, Aquatic Therapy, Dry Needling, Electrical stimulation, Spinal mobilization, Cryotherapy, Moist heat, Taping, Vasopneumatic device, and Manual therapy ?  ?PLAN FOR NEXT SESSION: Cont to focus on ROM, especially ER.  Cont with strengthening and ROM per Dr. Serena Croissant surgical protocol.   Add 1# weight to jobe's flexion and standing scaption next visit.  RTMD in 4 weeks.   ? ?  ?Audie Clear III PT, DPT ?10/06/21 9:18 PM ? ? ? ? ? ? ? ? ? ? ? ?  ? ?

## 2021-10-05 NOTE — Progress Notes (Signed)
? ?                            ? ? ?Post Operative Evaluation ?  ? ?Procedure/Date of Surgery: left shoulder arthroscopic labral repair 07/11/21 ? ?Interval History:  ? ? ?Presents today for follow-up of the above surgery.  Overall she is much improved in terms of her symptoms.  Range of motion is improving with physical therapy.  She does still have some weakness on the left side as result is hesitant to be able to return to work at this time. ? ? ?PMH/PSH/Family History/Social History/Meds/Allergies:   ? ?Past Medical History:  ?Diagnosis Date  ? Diabetes mellitus without complication (HCC)   ? Hypertension   ? ?Past Surgical History:  ?Procedure Laterality Date  ? FRACTURE SURGERY    ? rt ring finger  ? SHOULDER ARTHROSCOPY WITH LABRAL REPAIR Left 07/11/2021  ? Procedure: Left SHOULDER ARTHROSCOPY WITH LABRAL REPAIR;  Surgeon: Huel Cote, MD;  Location: [facility];  Service: Orthopedics;  Laterality: Left;  ? ?Social History  ? ?Socioeconomic History  ? Marital status: Single  ?  Spouse name: Not on file  ? Number of children: Not on file  ? Years of education: Not on file  ? Highest education level: Not on file  ?Occupational History  ? Not on file  ?Tobacco Use  ? Smoking status: Former  ?  Types: Cigarettes  ?  Quit date: 12/16/2018  ?  Years since quitting: 2.8  ? Smokeless tobacco: Never  ?Vaping Use  ? Vaping Use: Never used  ?Substance and Sexual Activity  ? Alcohol use: Yes  ?  Comment: social  ? Drug use: Never  ? Sexual activity: Not Currently  ?Other Topics Concern  ? Not on file  ?Social History Narrative  ? Not on file  ? ?Social Determinants of Health  ? ?Financial Resource Strain: Not on file  ?Food Insecurity: Not on file  ?Transportation Needs: Not on file  ?Physical Activity: Not on file  ?Stress: Not on file  ?Social Connections: Not on file  ? ?Family History  ?Family history unknown: Yes  ? ?No Known Allergies ?Current Outpatient Medications  ?Medication Sig Dispense Refill  ? acetaminophen  (TYLENOL) 500 MG tablet Take 1,000 mg by mouth every 6 (six) hours as needed for headache or moderate pain.    ? Albuterol Sulfate (PROAIR RESPICLICK) 108 (90 Base) MCG/ACT AEPB Inhale 2 puffs into the lungs every 6 (six) hours as needed. 1 each 4  ? amLODipine (NORVASC) 5 MG tablet TAKE 1 TABLET(5 MG) BY MOUTH DAILY 30 tablet 0  ? aspirin EC 325 MG tablet Take 1 tablet (325 mg total) by mouth daily. 30 tablet 0  ? cetirizine (ZYRTEC) 10 MG tablet Take 1 tablet (10 mg total) by mouth daily. (Patient not taking: Reported on 07/04/2021) 30 tablet 11  ? fluticasone (FLOVENT HFA) 44 MCG/ACT inhaler Inhale 2 puffs into the lungs in the morning and at bedtime. (Patient not taking: Reported on 07/04/2021) 1 Inhaler 12  ? HYDROmorphone (DILAUDID) 2 MG tablet Take 1 tablet (2 mg total) by mouth every 4 (four) hours as needed for severe pain. 30 tablet 0  ? losartan-hydrochlorothiazide (HYZAAR) 100-25 MG tablet Take 1 tablet by mouth daily. 90 tablet 1  ? Melatonin 1 MG CAPS Take 1 capsule (1 mg total) by mouth at bedtime. 30 capsule 0  ? metFORMIN (GLUCOPHAGE-XR) 500 MG 24 hr tablet Take  1 tablet (500 mg total) by mouth daily with breakfast. 90 tablet 1  ? methocarbamol (ROBAXIN) 500 MG tablet Take 1 tablet (500 mg total) by mouth in the morning and at bedtime. 30 tablet 2  ? oxyCODONE (OXY IR/ROXICODONE) 5 MG immediate release tablet Take 1 tablet (5 mg total) by mouth every 4 (four) hours as needed (severe pain). 20 tablet 0  ? ?No current facility-administered medications for this visit.  ? ?No results found. ? ?Review of Systems:   ?A ROS was performed including pertinent positives and negatives as documented in the HPI. ? ? ?Musculoskeletal Exam:   ? ?Last menstrual period 05/19/2013. ? ?Left shoulder portal incisions are clean dry and intact.  Active range of motion about the left shoulder is to 140  External rotation at the side actively is to approximately 30 compared to 45 on the contralateral side.  Internal rotation  is to the level of the sacrum.  Stable to flex and extend the left wrist.  2+ radial pulse. ? ?Imaging:   ? ?None ? ?I personally reviewed and interpreted the radiographs. ? ? ?Assessment:   ?12 weeks status post left shoulder labral repair.  Overall she continues to make significant improvements.  She is currently working on [employer] production pl[employer].  At this time I would recommend an additional 6 weeks off work and I will plan to see her back in 4 to that she may continue to pursue rehab and strengthening shoulder in preparation for return to work. ?Plan :   ? ?-Return to clinic in 4 weeks ? ?I personally saw and evaluated the patient, and participated in the management and treatment plan. ? ?Huel Cote, MD ?Attending Physician, Orthopedic Surgery ? ?This document was dictated using Conservation officer, historic buildings. A reasonable attempt at proof reading has been made to minimize errors. ?

## 2021-10-06 ENCOUNTER — Encounter (HOSPITAL_BASED_OUTPATIENT_CLINIC_OR_DEPARTMENT_OTHER): Payer: Self-pay | Admitting: Physical Therapy

## 2021-10-06 ENCOUNTER — Ambulatory Visit (HOSPITAL_BASED_OUTPATIENT_CLINIC_OR_DEPARTMENT_OTHER): Payer: 59 | Attending: Orthopaedic Surgery | Admitting: Physical Therapy

## 2021-10-06 DIAGNOSIS — M25612 Stiffness of left shoulder, not elsewhere classified: Secondary | ICD-10-CM | POA: Insufficient documentation

## 2021-10-06 DIAGNOSIS — M25512 Pain in left shoulder: Secondary | ICD-10-CM | POA: Diagnosis present

## 2021-10-06 DIAGNOSIS — M6281 Muscle weakness (generalized): Secondary | ICD-10-CM | POA: Insufficient documentation

## 2021-10-10 NOTE — Therapy (Signed)
?OUTPATIENT PHYSICAL THERAPY TREATMENT NOTE ? ? ?Patient Name: Judith Pollard ?MRN: [id number] ?DOB:06-20-1965, 56 y.o., female ?Today's Date: 10/12/2021 ? ?PCP: Dorna Mai, MD ? ? PT End of Session - 10/11/21 [id number]   ? ? Visit Number [id number]   ? Number of Visits 28   ? Date for PT Re-Evaluation 11/03/21   ? Authorization Type UHC   ? PT Start Time 0805   ? PT Stop Time 0850   ? PT Time Calculation (min) 45 min   ? Activity Tolerance Patient tolerated treatment well   ? Behavior During Therapy [facility] for tasks assessed/performed   ? ?  ?  ? ?  ? ? ? ? ? ? ? ? ? ? ? ?Past Medical History:  ?Diagnosis Date  ? Diabetes mellitus without complication ([place])   ? Hypertension   ? ?Past Surgical History:  ?Procedure Laterality Date  ? FRACTURE SURGERY    ? rt ring finger  ? SHOULDER ARTHROSCOPY WITH LABRAL REPAIR Left 07/11/2021  ? Procedure: Left SHOULDER ARTHROSCOPY WITH LABRAL REPAIR;  Surgeon: Vanetta Mulders, MD;  Location: [place];  Service: Orthopedics;  Laterality: Left;  ? ?Patient Active Problem List  ? Diagnosis Date Noted  ? Instability of left shoulder joint   ? Asthma, cough variant 08/05/2019  ? Prediabetes 06/27/2019  ? Essential hypertension 06/26/2019  ? 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/26/2019  ? Non-seasonal allergic rhinitis 06/26/2019  ? Morbid obesity ([place]) 06/26/2019  ? Hyperglycemia 06/26/2019  ? ?REFERRING MD:  Vanetta Mulders, MD ? ?REFERRING DIAG: s/p Lt shoulder labral repair ? ?THERAPY DIAG:  ?Acute pain of left shoulder ? ?Muscle weakness (generalized) ? ?Stiffness of left shoulder, not elsewhere classified ? ?PERTINENT HISTORY: pre DM- per pt ? ?PRECAUTIONS: shoulder, DOS 07/11/21 ? ?SUBJECTIVE:  Pt is 13 weeks and 1 day s/p L shoulder ant labral repair.  Pt states she did have some soreness after prior Rx.  Pt reports compliance with HEP and states she has soreness with new exercises.  Pt is able to hold her coffee cup for awhile without hurting.  Pt is able to sleep on L side.  Pt reports improved mobility  and reaching.  Pt states MD put her out of work for another 6 weeks and will return to MD in 4 weeks.  ? ? ? ?PAIN:  ?Are you having pain? yes ?NPRS scale: 1/10 ?Pain location: post shoulder ? ?Pain description: aching  ?Aggravating factors: constant ache ?Relieving factors: meds ? ? ?OBJECTIVE:  ? ?TODAY'S TREATMENT:  ?    ? ?Therapeutic Exercise: ? ?-Reviewed current function, HEP compliance, pain levels, and response to prior Rx.  ?  -Pt performed: ?Supine serratus punch with 2# 3x10 reps ?supine ABC x1 reps with 1# ?Prone extension to neutral with 1/2# 1 set each x 10 reps ?Prone row with to neutral 2x10 reps ?Supine rhythmic stabs at 90 deg flexion 2x30 sec, at 60 deg 2x30 sec  ?Standing Jobe's flexion with 1# 2x10 ?Standing scaption with 1# 2x10 ?Standing ER/IR with arm at side with YTB 2x10 each   ? ? ?Manual Therapy:   ?Pt received grade II post and inf jt mobs to improve pain, ROM, stiffness, and to normalize arthrokinematics ?-Pt received L shoulder PROM in flexion, abd, scaption, ER, and IR per protocols and pt/tissue tolerance. ? ? ?  ?PATIENT EDUCATION: ?Education details:  Updated HEP and gave pt a HEP handout.  Educated pt in appropriate frequency of strengthening per protocol.  Instructed pt she  should not have pain with HEP.  Instructed pt to hold on prone row.  exercise form. ?Person educated: Patient ?Education method: Explanation, Demonstration, Tactile cues, Verbal cues, handout ?Education comprehension: verbalized understanding, returned demonstration, verbal cues required, tactile cues required, and needs further education ?  ?  ?HOME EXERCISE PROGRAM: ?[id number] ? ?Added to HEP ?- Standing Shoulder Flexion to 90 Degrees  - 1 x daily - 5-6 x weekly - 2 sets - 10 reps ?- Standing Shoulder Scaption  - 1 x daily - 5-6 x weekly - 2 sets - 10 reps ? ?  ?ASSESSMENT: ?  ?CLINICAL IMPRESSION: ?Pt is improving with ROM and tightness t/o L shoulder.  She continues to have tightness in ER though is  improving.  PT progressed exercises by adding light resistance to flexion and scaption and also adding YTB ER and IR.  Pt performed exercises well with cuing for correct form and positioning.  She is improving with ROM and strength t/o L shoulder.  Pt has expected weakness t/o L shoulder.  She does have difficulty with prone row though demonstrated improved form with instruction and multimodal cuing.  Pt responded well to Rx having no increased pain after Rx.   Pt should benefit from cont skilled PT services per protocol to address ongoing goals, improve ROM, and assist in restoring desired level of function.   ? ? ?Objective impairments include decreased activity tolerance, decreased mobility, decreased ROM, decreased strength, increased edema, increased muscle spasms, impaired flexibility, impaired sensation, impaired UE functional use, improper body mechanics, and pain. These impairments are limiting patient from cleaning, community activity, meal prep, occupation, laundry, and shopping. Personal factors including DM, 1 comorbidity: pre-DM  are also affecting patient's functional outcome.   ? ?REHAB POTENTIAL: Good ?  ?CLINICAL DECISION MAKING: Stable/uncomplicated ?  ?EVALUATION COMPLEXITY: Low ?  ?  ?GOALS: ? ?  ?SHORT TERM GOALS: ?  ?STG Name Target Date Goal status  ?1 Comfortable passive flexion to 90 deg  ?Baseline: not tested at eval 08/05/2021 achieved  ?2 Good periscapular activation with shoulder at neutral ?Baseline: will progress as appropriate 08/05/2021 achieved  ?3 Independent with donning/doffing sling ?Baseline:educated at eval 07/21/21 ?  achieved  ?4 Independent with HEP as it has been developed in short term ?Baseline:will progress as appropriate 08/05/2021 achieved  ?         ?         ?         ?  ?LONG TERM GOALS:  ?  ?LTG Name Target Date Goal status  ?1 Good form in reaching Overhead ?Baseline:unable at eval 11/03/2021 INITIAL  ?2 Shoulder strength in dynamometry testing 90% of opposite  arm ?Baseline: 11/03/2021 INITIAL  ?3 Pt will verbalize readiness to return to work ?Baseline:will progress as appropriate 11/03/2021 INITIAL  ?4 FOTO to meet goal ?Baseline: will evaluate as PT progresses 11/03/2021 INITIAL  ?5 Independent in long term HEP for continued strengthening ?Baseline: 11/03/2021 INITIAL  ?         ?         ?  ?PLAN: ?PT FREQUENCY:  2-3x/week ?  ?PT DURATION: 8 weeks ?  ?PLANNED INTERVENTIONS: Therapeutic exercises, Therapeutic activity, Neuro Muscular re-education, Patient/Family education, Joint mobilization, Aquatic Therapy, Dry Needling, Electrical stimulation, Spinal mobilization, Cryotherapy, Moist heat, Taping, Vasopneumatic device, and Manual therapy ?  ?PLAN FOR NEXT SESSION: Cont to focus on ROM, especially ER.  Cont with strengthening and ROM per Dr. Eddie Dibbles surgical protocol.    RTMD  in 4 weeks.   ? ?  ?Selinda Michaels III PT, DPT ?10/12/21 7:06 AM ? ? ? ? ? ? ? ? ? ? ? ? ?  ? ?

## 2021-10-11 ENCOUNTER — Ambulatory Visit (HOSPITAL_BASED_OUTPATIENT_CLINIC_OR_DEPARTMENT_OTHER): Payer: 59 | Admitting: Physical Therapy

## 2021-10-11 ENCOUNTER — Encounter (HOSPITAL_BASED_OUTPATIENT_CLINIC_OR_DEPARTMENT_OTHER): Payer: Self-pay | Admitting: Physical Therapy

## 2021-10-11 DIAGNOSIS — M25512 Pain in left shoulder: Secondary | ICD-10-CM

## 2021-10-11 DIAGNOSIS — M6281 Muscle weakness (generalized): Secondary | ICD-10-CM

## 2021-10-11 DIAGNOSIS — M25612 Stiffness of left shoulder, not elsewhere classified: Secondary | ICD-10-CM

## 2021-10-12 NOTE — Therapy (Signed)
?OUTPATIENT PHYSICAL THERAPY TREATMENT NOTE ? ? ?Patient Name: Judith Pollard ?MRN: [id number] ?DOB:22-Mar-1966, 56 y.o., female ?Today's Date: 10/13/2021 ? ?PCP: Georganna Skeans, MD ? ? PT End of Session - 10/13/21 4193   ? ? Visit Number 18   ? Number of Visits 28   ? Date for PT Re-Evaluation 11/03/21   ? Authorization Type UHC   ? PT Start Time 0809   ? PT Stop Time [phone]   ? PT Time Calculation (min) 43 min   ? Activity Tolerance Patient tolerated treatment well   ? Behavior During Therapy [facility] for tasks assessed/performed   ? ?  ?  ? ?  ? ? ? ? ? ? ? ? ? ? ? ? ?Past Medical History:  ?Diagnosis Date  ? Diabetes mellitus without complication (HCC)   ? Hypertension   ? ?Past Surgical History:  ?Procedure Laterality Date  ? FRACTURE SURGERY    ? rt ring finger  ? SHOULDER ARTHROSCOPY WITH LABRAL REPAIR Left 07/11/2021  ? Procedure: Left SHOULDER ARTHROSCOPY WITH LABRAL REPAIR;  Surgeon: Huel Cote, MD;  Location: [facility];  Service: Orthopedics;  Laterality: Left;  ? ?Patient Active Problem List  ? Diagnosis Date Noted  ? Instability of left shoulder joint   ? Asthma, cough variant 08/05/2019  ? Prediabetes 06/27/2019  ? Essential hypertension 06/26/2019  ? 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/26/2019  ? Non-seasonal allergic rhinitis 06/26/2019  ? Morbid obesity (HCC) 06/26/2019  ? Hyperglycemia 06/26/2019  ? ?REFERRING MD:  Huel Cote, MD ? ?REFERRING DIAG: s/p Lt shoulder labral repair ? ?THERAPY DIAG:  ?Acute pain of left shoulder ? ?Muscle weakness (generalized) ? ?Stiffness of left shoulder, not elsewhere classified ? ?PERTINENT HISTORY: pre DM- per pt ? ?PRECAUTIONS: shoulder, DOS 07/11/21 ? ?SUBJECTIVE:  Pt is 13 weeks and 3 days s/p L shoulder ant labral repair.  Pt denies any adverse effects after prior Rx.  Pt reports compliance with HEP.  Pt is able to hold her coffee cup for awhile without hurting.  Pt is able to sleep on L side.  Pt reports improved mobility and reaching.   ? ?PAIN:  ?Are you having pain?  no ?NPRS scale: 0/10 ?Pain location: post shoulder ? ?Pain description: aching  ?Aggravating factors: constant ache ?Relieving factors: meds ? ? ?OBJECTIVE:  ? ?TODAY'S TREATMENT:  ?    ? ?Therapeutic Exercise: ? ?-Reviewed current function, HEP compliance, pain levels, and response to prior Rx.  ?  -Pt performed: ?Supine serratus punch with 2# x10 and with 3# 2x10 reps ?supine ABC x1 reps with 2# ?Prone extension to neutral with 2# 2 x 10 reps ?Prone row with to neutral 2x10 reps ?Supine rhythmic stabs at 90 deg flexion x30 sec, at 60 deg 2x30 sec, 120 deg x 30 sec  ?Standing Jobe's flexion with 1# and 2# x10 reps each ?Standing scaption with 1# and 2# x 10 reps  ?Shelf reach 2x10 reps ?Standing ER/IR with arm at side with YTB 2x10 each   ?-Updated HEP ? ?Manual Therapy:   ?Pt received grade II post and inf jt mobs to improve pain, ROM, stiffness, and to normalize arthrokinematics ?-Pt received L shoulder PROM in flexion, abd, scaption, ER, and IR per protocols and pt/tissue tolerance. ? ? ?  ?PATIENT EDUCATION: ?Education details:  Updated HEP and gave pt a HEP handout.  Educated pt in appropriate frequency of strengthening per protocol.  Instructed pt she should not have pain with HEP.  exercise form. ?Person  educated: Patient ?Education method: Explanation, Demonstration, Tactile cues, Verbal cues, handout ?Education comprehension: verbalized understanding, returned demonstration, verbal cues required, tactile cues required, and needs further education ?  ?  ?HOME EXERCISE PROGRAM: ?[id number] ? ?Added to HEP ?- Shoulder External Rotation with Anchored Resistance  - 1 x daily - 3 x weekly - 2-3 sets - 10 reps ?- Shoulder Internal Rotation with Resistance  - 1 x daily - 3 x weekly - 2-3 sets - 10 reps ? ?  ?ASSESSMENT: ?  ?CLINICAL IMPRESSION: ?Pt is improving with ROM and tightness t/o L shoulder.  She continues to have tightness in shoulder ROM though is improving.  Pt was a little tighter in ER today.  Pt is  improving with L shoulder/scapular strength as evidenced by performance of exercises with increased resistance today.  Pt performed exercises well with verbal, visual, and tactile cuing for correct form and positioning.  Pt responded well to Rx having no increased pain after Rx.   Pt should benefit from cont skilled PT services per protocol to address ongoing goals, improve ROM, and assist in restoring desired level of function.   ? ? ?Objective impairments include decreased activity tolerance, decreased mobility, decreased ROM, decreased strength, increased edema, increased muscle spasms, impaired flexibility, impaired sensation, impaired UE functional use, improper body mechanics, and pain. These impairments are limiting patient from cleaning, community activity, meal prep, occupation, laundry, and shopping. Personal factors including DM, 1 comorbidity: pre-DM  are also affecting patient's functional outcome.   ? ?REHAB POTENTIAL: Good ?  ?CLINICAL DECISION MAKING: Stable/uncomplicated ?  ?EVALUATION COMPLEXITY: Low ?  ?  ?GOALS: ? ?  ?SHORT TERM GOALS: ?  ?STG Name Target Date Goal status  ?1 Comfortable passive flexion to 90 deg  ?Baseline: not tested at eval 08/05/2021 achieved  ?2 Good periscapular activation with shoulder at neutral ?Baseline: will progress as appropriate 08/05/2021 achieved  ?3 Independent with donning/doffing sling ?Baseline:educated at eval 07/21/21 ?  achieved  ?4 Independent with HEP as it has been developed in short term ?Baseline:will progress as appropriate 08/05/2021 achieved  ?         ?         ?         ?  ?LONG TERM GOALS:  ?  ?LTG Name Target Date Goal status  ?1 Good form in reaching Overhead ?Baseline:unable at eval 11/03/2021 INITIAL  ?2 Shoulder strength in dynamometry testing 90% of opposite arm ?Baseline: 11/03/2021 INITIAL  ?3 Pt will verbalize readiness to return to work ?Baseline:will progress as appropriate 11/03/2021 INITIAL  ?4 FOTO to meet goal ?Baseline: will evaluate as PT  progresses 11/03/2021 INITIAL  ?5 Independent in long term HEP for continued strengthening ?Baseline: 11/03/2021 INITIAL  ?         ?         ?  ?PLAN: ?PT FREQUENCY:  2-3x/week ?  ?PT DURATION: 8 weeks ?  ?PLANNED INTERVENTIONS: Therapeutic exercises, Therapeutic activity, Neuro Muscular re-education, Patient/Family education, Joint mobilization, Aquatic Therapy, Dry Needling, Electrical stimulation, Spinal mobilization, Cryotherapy, Moist heat, Taping, Vasopneumatic device, and Manual therapy ?  ?PLAN FOR NEXT SESSION: Cont to focus on ROM, especially ER.  Cont with strengthening and ROM per Dr. Serena Croissant surgical protocol.    RTMD in 4 weeks.   ? ?  ?Audie Clear III PT, DPT ?10/13/21 4:58 PM ? ? ? ? ? ? ? ? ? ? ? ? ? ?  ? ?

## 2021-10-13 ENCOUNTER — Encounter (HOSPITAL_BASED_OUTPATIENT_CLINIC_OR_DEPARTMENT_OTHER): Payer: Self-pay | Admitting: Physical Therapy

## 2021-10-13 ENCOUNTER — Ambulatory Visit (HOSPITAL_BASED_OUTPATIENT_CLINIC_OR_DEPARTMENT_OTHER): Payer: 59 | Admitting: Physical Therapy

## 2021-10-13 DIAGNOSIS — M25512 Pain in left shoulder: Secondary | ICD-10-CM

## 2021-10-13 DIAGNOSIS — M25612 Stiffness of left shoulder, not elsewhere classified: Secondary | ICD-10-CM

## 2021-10-13 DIAGNOSIS — M6281 Muscle weakness (generalized): Secondary | ICD-10-CM

## 2021-10-18 ENCOUNTER — Encounter (HOSPITAL_BASED_OUTPATIENT_CLINIC_OR_DEPARTMENT_OTHER): Payer: Self-pay | Admitting: Physical Therapy

## 2021-10-18 ENCOUNTER — Ambulatory Visit (HOSPITAL_BASED_OUTPATIENT_CLINIC_OR_DEPARTMENT_OTHER): Payer: 59 | Admitting: Physical Therapy

## 2021-10-18 DIAGNOSIS — M25612 Stiffness of left shoulder, not elsewhere classified: Secondary | ICD-10-CM

## 2021-10-18 DIAGNOSIS — M25512 Pain in left shoulder: Secondary | ICD-10-CM | POA: Diagnosis not present

## 2021-10-18 DIAGNOSIS — M6281 Muscle weakness (generalized): Secondary | ICD-10-CM

## 2021-10-18 NOTE — Therapy (Signed)
?OUTPATIENT PHYSICAL THERAPY TREATMENT NOTE ? ? ?Patient Name: Judith Pollard ?MRN: [id number] ?DOB:03-May-1966, 56 y.o., female ?Today's Date: 10/18/2021 ? ?PCP: Georganna Skeans, MD ? ? ? ? ? ? ? ? ? ? ? ? ? ? ?Past Medical History:  ?Diagnosis Date  ? Diabetes mellitus without complication (HCC)   ? Hypertension   ? ?Past Surgical History:  ?Procedure Laterality Date  ? FRACTURE SURGERY    ? rt ring finger  ? SHOULDER ARTHROSCOPY WITH LABRAL REPAIR Left 07/11/2021  ? Procedure: Left SHOULDER ARTHROSCOPY WITH LABRAL REPAIR;  Surgeon: Huel Cote, MD;  Location: [facility];  Service: Orthopedics;  Laterality: Left;  ? ?Patient Active Problem List  ? Diagnosis Date Noted  ? Instability of left shoulder joint   ? Asthma, cough variant 08/05/2019  ? Prediabetes 06/27/2019  ? Essential hypertension 06/26/2019  ? 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/26/2019  ? Non-seasonal allergic rhinitis 06/26/2019  ? Morbid obesity (HCC) 06/26/2019  ? Hyperglycemia 06/26/2019  ? ?REFERRING MD:  Huel Cote, MD ? ?REFERRING DIAG: s/p Lt shoulder labral repair ? ?THERAPY DIAG:  ?No diagnosis found. ? ?PERTINENT HISTORY: pre DM- per pt ? ?PRECAUTIONS: shoulder, DOS 07/11/21 ? ?SUBJECTIVE:  Pt is 13 weeks and 3 days s/p L shoulder ant labral repair.  Pt denies any adverse effects after prior Rx.  Pt reports compliance with HEP.  Pt is able to hold her coffee cup for awhile without hurting.  Pt is able to sleep on L side.  Pt reports improved mobility and reaching.   ? ?PAIN:  ?Are you having pain? no ?NPRS scale: 0/10 ?Pain location: post shoulder ? ?Pain description: aching  ?Aggravating factors: constant ache ?Relieving factors: meds ? ? ?OBJECTIVE:  ? ?TODAY'S TREATMENT:  ?5/16 ?Supine serratus punch with 2# x10 and with 3# 2x10 reps ?supine ABC x1 reps with 2# ?Prone extension to neutral with 2# 2 x 10 reps ?Prone row with to neutral 2x10 reps ?Supine rhythmic stabs at 90 deg flexion x30 sec, at 60 deg 2x30 sec, 120 deg x 30 sec  ?Standing  Jobe's flexion with 1# and 2# x10 reps each ?Standing scaption with 1# and 2# x 10 reps  ?Shelf reach 2x10 reps ?Standing ER/IR with arm at side with YTB 2x10 eac  ? ?Pt received grade II post and inf jt mobs to improve pain, ROM, stiffness, and to normalize arthrokinematics ?-Pt received L shoulder PROM in flexion, abd, scaption, ER, and IR per protocols and pt/tissue tolerance.   ? ?Last visit ? ?Therapeutic Exercise: ? ?-Reviewed current function, HEP compliance, pain levels, and response to prior Rx.  ?  -Pt performed: ?Supine serratus punch with 2# x10 and with 3# 2x10 reps ?supine ABC x1 reps with 2# ?Prone extension to neutral with 2# 2 x 10 reps ?Prone row with to neutral 2x10 reps ?Supine rhythmic stabs at 90 deg flexion x30 sec, at 60 deg 2x30 sec, 120 deg x 30 sec  ?Standing Jobe's flexion with 1# and 2# x10 reps each ?Standing scaption with 1# and 2# x 10 reps  ?Shelf reach 2x10 reps ?Standing ER/IR with arm at side with YTB 2x10 each   ?-Updated HEP ? ?Manual Therapy:   ?Pt received grade II post and inf jt mobs to improve pain, ROM, stiffness, and to normalize arthrokinematics ?-Pt received L shoulder PROM in flexion, abd, scaption, ER, and IR per protocols and pt/tissue tolerance. ? ? ?  ?PATIENT EDUCATION: ?Education details:  Updated HEP and gave pt a  HEP handout.  Educated pt in appropriate frequency of strengthening per protocol.  Instructed pt she should not have pain with HEP.  exercise form. ?Person educated: Patient ?Education method: Explanation, Demonstration, Tactile cues, Verbal cues, handout ?Education comprehension: verbalized understanding, returned demonstration, verbal cues required, tactile cues required, and needs further education ?  ?  ?HOME EXERCISE PROGRAM: ?[id number] ? ?Added to HEP ?- Shoulder External Rotation with Anchored Resistance  - 1 x daily - 3 x weekly - 2-3 sets - 10 reps ?- Shoulder Internal Rotation with Resistance  - 1 x daily - 3 x weekly - 2-3 sets - 10 reps ? ?   ?ASSESSMENT: ?  ?CLINICAL IMPRESSION: ?Patients ER was tight when she first came in but it improved with manual therapy. She tolerated exercises well. Her exercises were kept consistent from the last visit. We will continue to progress as tolerated.  ? ?Objective impairments include decreased activity tolerance, decreased mobility, decreased ROM, decreased strength, increased edema, increased muscle spasms, impaired flexibility, impaired sensation, impaired UE functional use, improper body mechanics, and pain. These impairments are limiting patient from cleaning, community activity, meal prep, occupation, laundry, and shopping. Personal factors including DM, 1 comorbidity: pre-DM  are also affecting patient's functional outcome.   ? ?REHAB POTENTIAL: Good ?  ?CLINICAL DECISION MAKING: Stable/uncomplicated ?  ?EVALUATION COMPLEXITY: Low ?  ?  ?GOALS: ? ?  ?SHORT TERM GOALS: ?  ?STG Name Target Date Goal status  ?1 Comfortable passive flexion to 90 deg  ?Baseline: not tested at eval 08/05/2021 achieved  ?2 Good periscapular activation with shoulder at neutral ?Baseline: will progress as appropriate 08/05/2021 achieved  ?3 Independent with donning/doffing sling ?Baseline:educated at eval 07/21/21 ?  achieved  ?4 Independent with HEP as it has been developed in short term ?Baseline:will progress as appropriate 08/05/2021 achieved  ?         ?         ?         ?  ?LONG TERM GOALS:  ?  ?LTG Name Target Date Goal status  ?1 Good form in reaching Overhead ?Baseline:unable at eval 11/03/2021 INITIAL  ?2 Shoulder strength in dynamometry testing 90% of opposite arm ?Baseline: 11/03/2021 INITIAL  ?3 Pt will verbalize readiness to return to work ?Baseline:will progress as appropriate 11/03/2021 INITIAL  ?4 FOTO to meet goal ?Baseline: will evaluate as PT progresses 11/03/2021 INITIAL  ?5 Independent in long term HEP for continued strengthening ?Baseline: 11/03/2021 INITIAL  ?         ?         ?  ?PLAN: ?PT FREQUENCY:  2-3x/week ?  ?PT  DURATION: 8 weeks ?  ?PLANNED INTERVENTIONS: Therapeutic exercises, Therapeutic activity, Neuro Muscular re-education, Patient/Family education, Joint mobilization, Aquatic Therapy, Dry Needling, Electrical stimulation, Spinal mobilization, Cryotherapy, Moist heat, Taping, Vasopneumatic device, and Manual therapy ?  ?PLAN FOR NEXT SESSION: Cont to focus on ROM, especially ER.  Cont with strengthening and ROM per Dr. Serena Croissant surgical protocol.    RTMD in 4 weeks.   ? ?  ?Lorayne Bender PT DPT  ?10/18/21 8:45 AM ? ? ? ? ? ? ? ? ? ? ? ? ? ?  ? ?

## 2021-10-19 NOTE — Therapy (Signed)
OUTPATIENT PHYSICAL THERAPY TREATMENT NOTE / PROGRESS NOTE   Patient Name: Judith Pollard MRN: [id number] DOB:1966-01-04, 56 y.o., female Today's Date: 10/21/2021  PCP: Dorna Mai, MD   PT End of Session - 10/20/21 0915     Visit Number 20    Number of Visits 30    Date for PT Re-Evaluation 11/24/21    Authorization Type [employer]    PT Start Time 0850    PT Stop Time 0935    PT Time Calculation (min) 45 min    Activity Tolerance Patient tolerated treatment well    Behavior During Therapy [facility] for tasks assessed/performed                        Past Medical History:  Diagnosis Date   Diabetes mellitus without complication ([place])    Hypertension    Past Surgical History:  Procedure Laterality Date   FRACTURE SURGERY     rt ring finger   SHOULDER ARTHROSCOPY WITH LABRAL REPAIR Left 07/11/2021   Procedure: Left SHOULDER ARTHROSCOPY WITH LABRAL REPAIR;  Surgeon: Vanetta Mulders, MD;  Location: [place];  Service: Orthopedics;  Laterality: Left;   Patient Active Problem List   Diagnosis Date Noted   Instability of left shoulder joint    Asthma, cough variant 08/05/2019   Prediabetes 06/27/2019   Essential hypertension 06/26/2019   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/26/2019   Non-seasonal allergic rhinitis 06/26/2019   Morbid obesity ([place]) 06/26/2019   Hyperglycemia 06/26/2019   REFERRING MD:  Vanetta Mulders, MD  REFERRING DIAG: s/p Lt shoulder labral repair  THERAPY DIAG:  Acute pain of left shoulder  Muscle weakness (generalized)  Stiffness of left shoulder, not elsewhere classified  PERTINENT HISTORY: pre DM- per pt  PRECAUTIONS: shoulder, DOS 07/11/21  SUBJECTIVE:  Pt is 14 weeks and 3 days s/p L shoulder ant labral repair.  Pt denies any adverse effects after prior Rx.  Pt reports compliance with HEP.  Pt is able to hold her coffee cup for awhile without hurting.  Pt is able to sleep on L side.  Pt reports improved mobility and reaching.  Pt is able to  reach overhead better and also able to reach behind activities.  Pt reports minimal difficulty with self care activities and ADLs/IADLs.  Pt reports improved ability to put her hair up.  Pt is able to put some pressure on UE to get OOB and also getting out of tub.  Pt is still limited with reaching overhead, performing overhead activities, and also doffing shirt.  Pt has not been released to work activities.     PAIN:  Are you having pain? no NPRS scale: 0/10 Pain location: post shoulder  Pain description: aching  Aggravating factors: constant ache Relieving factors: meds   OBJECTIVE:   TODAY'S TREATMENT:  FOTO:  46, no change.  Goal is 73 at visit #15  Therapeutic Exercise:  L Shoulder AROM/PROM:  Flex:  137/153, scaption, 131, Abd:  103/114 deg ER:  43/44 deg, IR:  57/63 deg L Shoulder strength:  (W/N available Range) Flex: 4/5, abd:  4-/5, ER:  tolerates moderate resistance well, IR:  4+/5  Pt performed Supine rhythmic stabs at 90 deg flexion x1 min, at 60 deg x 1 min, 120 deg x 30 sec  Standing Jobe's flexion with 2# 2x10 reps  Standing scaption with 2# 2 x 10 reps  Shelf reach 2x10 reps Standing ER/IR with arm at side with YTB 2x10 eac  Manual Therapy:  Pt received grade II post and inf jt mobs to improve pain, ROM, stiffness, and to normalize arthrokinematics -Pt received L shoulder PROM in flexion, abd, scaption, ER, and IR per protocols and pt/tissue tolerance.       PATIENT EDUCATION: Education details:  Educated pt in appropriate frequency of strengthening per protocol.  Instructed pt she should not have pain with HEP.  exercise form. Person educated: Patient Education method: Explanation, Demonstration, Tactile cues, Verbal cues, handout Education comprehension: verbalized understanding, returned demonstration, verbal cues required, tactile cues required, and needs further education     HOME EXERCISE PROGRAM: [id number]     ASSESSMENT:   CLINICAL  IMPRESSION: Pt is progressing well with ROM, strength, pain, and function.  Pt has improved with functional usage of L UE.  She is able to perform her self care activities and ADLs/IADLs with greater ease and less difficulty.  She is still limited with reaching and overhead activities though is improving.  Pt continues to improve with ROM though continues to be tight and limited with ER.  Her ER does improve with increased reps of PROM.  Pt demonstrates improved strength t/o L UE as evidenced by MMT and progression of exercises.  Pt has not returned to work.  Pt is progressing toward LTG's.  Pt responded well to Rx having no c/o's after Rx.  She should benefit from cont skilled PT services per protocol to address ongoing goals and to assist in restoring PLOF.   Objective impairments include decreased activity tolerance, decreased mobility, decreased ROM, decreased strength, increased edema, increased muscle spasms, impaired flexibility, impaired sensation, impaired UE functional use, improper body mechanics, and pain. These impairments are limiting patient from cleaning, community activity, meal prep, occupation, laundry, and shopping. Personal factors including DM, 1 comorbidity: pre-DM  are also affecting patient's functional outcome.    REHAB POTENTIAL: Good   CLINICAL DECISION MAKING: Stable/uncomplicated   EVALUATION COMPLEXITY: Low     GOALS:    SHORT TERM GOALS:   STG Name Target Date Goal status  1 Comfortable passive flexion to 90 deg  Baseline: not tested at eval 08/05/2021 achieved  2 Good periscapular activation with shoulder at neutral Baseline: will progress as appropriate 08/05/2021 achieved  3 Independent with donning/doffing sling Baseline:educated at eval 07/21/21   achieved  4 Independent with HEP as it has been developed in short term Baseline:will progress as appropriate 08/05/2021 achieved                               LONG TERM GOALS:    LTG Name Target Date Goal  status  1 Good form in reaching Overhead Baseline:unable at eval 11/24/2021 PROGRESSING  2 Shoulder strength in dynamometry testing 90% of opposite arm Baseline: 11/24/2021 PROGRESSING  3 Pt will verbalize readiness to return to work Baseline:will progress as appropriate 11/24/2021 PROGRESSING  4 FOTO to meet goal Baseline: will evaluate as PT progresses 11/24/2021 NOT MET  5 Independent in long term HEP for continued strengthening Baseline: 11/24/2021 PROGRESSING   6  Pt will demo L shoulder AROM to be [facility] t/o for performance of ADLs and IADLs 11/24/2021  INITIAL             PLAN: PT FREQUENCY:  2x/week   PT DURATION: 5 weeks   PLANNED INTERVENTIONS: Therapeutic exercises, Therapeutic activity, Neuro Muscular re-education, Patient/Family education, Joint mobilization, Aquatic Therapy, Dry Needling, Electrical stimulation, Spinal mobilization, Cryotherapy, Moist  heat, Taping, Vasopneumatic device, and Manual therapy   PLAN FOR NEXT SESSION: Cont with strengthening and ROM per Dr. Eddie Dibbles surgical protocol.  Cont to work on ROM, especially ER.      Selinda Michaels III PT, DPT 10/21/21 9:35 AM

## 2021-10-20 ENCOUNTER — Encounter (HOSPITAL_BASED_OUTPATIENT_CLINIC_OR_DEPARTMENT_OTHER): Payer: Self-pay | Admitting: Physical Therapy

## 2021-10-20 ENCOUNTER — Ambulatory Visit (HOSPITAL_BASED_OUTPATIENT_CLINIC_OR_DEPARTMENT_OTHER): Payer: 59 | Admitting: Physical Therapy

## 2021-10-20 DIAGNOSIS — M6281 Muscle weakness (generalized): Secondary | ICD-10-CM

## 2021-10-20 DIAGNOSIS — M25512 Pain in left shoulder: Secondary | ICD-10-CM | POA: Diagnosis not present

## 2021-10-20 DIAGNOSIS — M25612 Stiffness of left shoulder, not elsewhere classified: Secondary | ICD-10-CM

## 2021-10-25 ENCOUNTER — Ambulatory Visit (HOSPITAL_BASED_OUTPATIENT_CLINIC_OR_DEPARTMENT_OTHER): Payer: 59 | Admitting: Physical Therapy

## 2021-10-26 ENCOUNTER — Other Ambulatory Visit: Payer: Self-pay | Admitting: Family Medicine

## 2021-10-26 NOTE — Telephone Encounter (Signed)
Medication Refill - Medication: amLODipine (NORVASC) 5 MG tablet losartan-hydrochlorothiazide (HYZAAR) 100-25 MG tablet  Has the patient contacted their pharmacy? No. (Pt called for appt on scheduled 6/29  Preferred Pharmacy (with phone number or street name): [facility] UV:[id number] - [place], [address] AT [place] Has the patient been seen for an appointment in the last year OR does the patient have an upcoming appointment? Yes.    Agent: Please be advised that RX refills may take up to 3 business days. We ask that you follow-up with your pharmacy.

## 2021-10-27 ENCOUNTER — Encounter (HOSPITAL_BASED_OUTPATIENT_CLINIC_OR_DEPARTMENT_OTHER): Payer: Self-pay | Admitting: Physical Therapy

## 2021-10-27 ENCOUNTER — Ambulatory Visit (HOSPITAL_BASED_OUTPATIENT_CLINIC_OR_DEPARTMENT_OTHER): Payer: 59 | Admitting: Physical Therapy

## 2021-10-27 DIAGNOSIS — M25612 Stiffness of left shoulder, not elsewhere classified: Secondary | ICD-10-CM

## 2021-10-27 DIAGNOSIS — M25512 Pain in left shoulder: Secondary | ICD-10-CM

## 2021-10-27 DIAGNOSIS — M6281 Muscle weakness (generalized): Secondary | ICD-10-CM

## 2021-10-27 MED ORDER — LOSARTAN POTASSIUM-HCTZ 100-25 MG PO TABS: 1.0000 | ORAL_TABLET | Freq: Every day | ORAL | 0 refills | Status: AC

## 2021-10-27 MED ORDER — AMLODIPINE BESYLATE 5 MG PO TABS
ORAL_TABLET | ORAL | 0 refills | Status: AC
Start: 2021-10-27 — End: ?

## 2021-10-27 NOTE — Therapy (Signed)
OUTPATIENT PHYSICAL THERAPY TREATMENT NOTE / PROGRESS NOTE   Patient Name: Judith Pollard MRN: [id number] DOB:09/14/1965, 56 y.o., female Today's Date: 10/27/2021  PCP: Dorna Mai, MD   PT End of Session - 10/27/21 [phone]     Visit Number 21    Number of Visits 30    Date for PT Re-Evaluation 11/24/21    Authorization Type U[employer]    PT Start Time 0850    PT Stop Time 0935    PT Time Calculation (min) 45 min    Activity Tolerance Patient tolerated treatment well    Behavior During Therapy [facility] for tasks assessed/performed                        Past Medical History:  Diagnosis Date   Diabetes mellitus without complication ([place])    Hypertension    Past Surgical History:  Procedure Laterality Date   FRACTURE SURGERY     rt ring finger   SHOULDER ARTHROSCOPY WITH LABRAL REPAIR Left 07/11/2021   Procedure: Left SHOULDER ARTHROSCOPY WITH LABRAL REPAIR;  Surgeon: Vanetta Mulders, MD;  Location: [place];  Service: Orthopedics;  Laterality: Left;   Patient Active Problem List   Diagnosis Date Noted   Instability of left shoulder joint    Asthma, cough variant 08/05/2019   Prediabetes 06/27/2019   Essential hypertension 06/26/2019   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/26/2019   Non-seasonal allergic rhinitis 06/26/2019   Morbid obesity ([place]) 06/26/2019   Hyperglycemia 06/26/2019   REFERRING MD:  Vanetta Mulders, MD  REFERRING DIAG: s/p Lt shoulder labral repair  THERAPY DIAG:  Acute pain of left shoulder  Muscle weakness (generalized)  Stiffness of left shoulder, not elsewhere classified  PERTINENT HISTORY: pre DM- per pt  PRECAUTIONS: shoulder, DOS 07/11/21  SUBJECTIVE:  Pt is 15 weeks and 3 days s/p L shoulder ant labral repair.  Pt denies any adverse effects after prior Rx.  Pt reports compliance with HEP.  Pt reports she is ready to go back to work after she sees MD.  Pt states her expected return to work date is 11/16/2021.  Pt is able to hold her coffee cup  for awhile without hurting.  Pt is able to sleep on L side.  Pt reports improved mobility and reaching and is limited with reaching overhead.  Pt continues to use L UE more with daily activities.  Pt reports improved strength.    Pt reports minimal difficulty with self care activities and ADLs/IADLs.  Pt reports improved ability to put her hair up.  Pt is able to put some pressure on UE to get OOB and also getting out of tub.  Pt reports she is currently at 60-65% of her normal.  Pt is still limited with reaching overhead, performing overhead activities, and also doffing shirt.  Pt has not been released to work activities.     PAIN:  Are you having pain? no NPRS scale: 0/10 Pain location: post shoulder  Pain description: aching  Aggravating factors: constant ache Relieving factors: meds   OBJECTIVE:   TODAY'S TREATMENT:   Therapeutic Exercise: Reviewed response to prior Rx, HEP compliance, pain level, and current function.   Pt performed Supine wand ER x 5 reps with 10 sec hold Supine rhythmic stabs at 90 deg flexion x1 min, at 60 deg x 1 min, 120 deg x 30 sec Supine shoulder ABC x1 rep with 2#  Prone horizontal abduction 2x10 reps  Prone extension 2x10 reps with  2# Standing 4D ball rolls on table x10 reps each and x 10 reps on wall Standing Jobe's flexion with 2# 2x10 reps  Standing scaption with 2# 2 x 10 reps  Standing wall walks AAROM in flexion x10 reps Standing ER with YTB and IR with RTB with arm at side with 2x10 each   Manual Therapy:  Pt received grade II post and inf jt mobs to improve pain, ROM, stiffness, and to normalize arthrokinematics -Pt received L shoulder PROM in flexion, abd, scaption, ER, and IR per protocols and pt/tissue tolerance. -PNF hold relax in supine to improver ER ROM       PATIENT EDUCATION: Education details:  HEP including to added supine wand ER with 10 sec hold to improve ER ROM.  exercise form. Person educated: Patient Education method:  Explanation, Demonstration, Tactile cues, Verbal cues, handout Education comprehension: verbalized understanding, returned demonstration, verbal cues required, tactile cues required, and needs further education     HOME EXERCISE PROGRAM: [id number]     ASSESSMENT:   CLINICAL IMPRESSION: Pt continues to progress well with functional usage of L UE and reports she is at 60-65% of her normal.  She still has tightness in shoulder ROM and is limited with reaching overhead.  Pt had increased tightness t/o shoulder PROM today and continues to be very tight in ER.  Pt did have increased ROM with increased reps of PROM.  PT instructed pt to perform supine wand ER with 10 sec hold at home to improve ER ROM.  PT progressed exercises per protocol and pt performed well with instruction and cuing for correct form.  She is improving with strength as evidenced by performance and progression of exercises.  Pt responded well to Rx having no pain after Rx.  She should benefit from cont skilled PT services per protocol to address ongoing goals and to assist in restoring PLOF.    Objective impairments include decreased activity tolerance, decreased mobility, decreased ROM, decreased strength, increased edema, increased muscle spasms, impaired flexibility, impaired sensation, impaired UE functional use, improper body mechanics, and pain. These impairments are limiting patient from cleaning, community activity, meal prep, occupation, laundry, and shopping. Personal factors including DM, 1 comorbidity: pre-DM  are also affecting patient's functional outcome.    REHAB POTENTIAL: Good   CLINICAL DECISION MAKING: Stable/uncomplicated   EVALUATION COMPLEXITY: Low     GOALS:    SHORT TERM GOALS:   STG Name Target Date Goal status  1 Comfortable passive flexion to 90 deg  Baseline: not tested at eval 08/05/2021 achieved  2 Good periscapular activation with shoulder at neutral Baseline: will progress as appropriate  08/05/2021 achieved  3 Independent with donning/doffing sling Baseline:educated at eval 07/21/21   achieved  4 Independent with HEP as it has been developed in short term Baseline:will progress as appropriate 08/05/2021 achieved                               LONG TERM GOALS:    LTG Name Target Date Goal status  1 Good form in reaching Overhead Baseline:unable at eval 11/24/2021 PROGRESSING  2 Shoulder strength in dynamometry testing 90% of opposite arm Baseline: 11/24/2021 PROGRESSING  3 Pt will verbalize readiness to return to work Baseline:will progress as appropriate 11/24/2021 PROGRESSING  4 FOTO to meet goal Baseline: will evaluate as PT progresses 11/24/2021 NOT MET  5 Independent in long term HEP for continued strengthening Baseline: 11/24/2021 PROGRESSING  6  Pt will demo L shoulder AROM to be [facility] t/o for performance of ADLs and IADLs 11/24/2021  INITIAL             PLAN: PT FREQUENCY:  2x/week   PT DURATION: 5 weeks   PLANNED INTERVENTIONS: Therapeutic exercises, Therapeutic activity, Neuro Muscular re-education, Patient/Family education, Joint mobilization, Aquatic Therapy, Dry Needling, Electrical stimulation, Spinal mobilization, Cryotherapy, Moist heat, Taping, Vasopneumatic device, and Manual therapy   PLAN FOR NEXT SESSION: Cont with strengthening and ROM per Dr. Eddie Dibbles surgical protocol.  Cont to work on ROM, especially ER.  Add UBE next visit.    Selinda Michaels III PT, DPT 10/27/21 11:00 PM

## 2021-10-27 NOTE — Telephone Encounter (Signed)
Requested Prescriptions  Pending Prescriptions Disp Refills  . amLODipine (NORVASC) 5 MG tablet 30 tablet 0    Sig: TAKE 1 TABLET(5 MG) BY MOUTH DAILY     Cardiovascular: Calcium Channel Blockers 2 Failed - 10/26/2021  4:57 PM      Failed - Last BP in normal range    BP Readings from Last 1 Encounters:  07/11/21 (!) 139/94         Passed - Last Heart Rate in normal range    Pulse Readings from Last 1 Encounters:  07/11/21 83         Passed - Valid encounter within last 6 months    Recent Outpatient Visits          4 months ago Essential hypertension   [facility] at [facility], MD   4 months ago Preop general physical exam   Primary Care at [facility], MD   1 year ago Asthma, cough variant   Primary Care at [facility], Bayard Beaver, MD   1 year ago Asthma, cough variant   [place], Patrick E, MD   1 year ago Annual physical exam   Primary Care at [facility], Bayard Beaver, MD      Future Appointments            In 1 month Dorna Mai, MD Primary Care at [facility]           . losartan-hydrochlorothiazide (HYZAAR) 100-25 MG tablet 90 tablet 0    Sig: Take 1 tablet by mouth daily.     Cardiovascular: ARB + Diuretic Combos Failed - 10/26/2021  4:57 PM      Failed - Last BP in normal range    BP Readings from Last 1 Encounters:  07/11/21 (!) 139/94         Passed - K in normal range and within 180 days    Potassium  Date Value Ref Range Status  07/05/2021 3.8 3.5 - 5.1 mmol/L Final         Passed - Na in normal range and within 180 days    Sodium  Date Value Ref Range Status  07/05/2021 138 135 - 145 mmol/L Final  04/08/2020 139 134 - 144 mmol/L Final         Passed - Cr in normal range and within 180 days    Creatinine, Ser  Date Value Ref Range Status  07/05/2021 0.79 0.44 - 1.00 mg/dL Final         Passed - eGFR is 10 or above and  within 180 days    GFR calc Af Amer  Date Value Ref Range Status  04/08/2020 107 >59 mL/min/1.73 Final    Comment:    **In accordance with recommendations from the NKF-ASN Task force,**   [facility] is in the process of updating its eGFR calculation to the   2021 CKD-EPI creatinine equation that estimates kidney function   without a race variable.    GFR, Estimated  Date Value Ref Range Status  07/05/2021 >60 >60 mL/min Final    Comment:    (NOTE) Calculated using the CKD-EPI Creatinine Equation (2021)          Passed - Patient is not pregnant      Passed - Valid encounter within last 6 months    Recent Outpatient Visits          4 months ago Essential hypertension  Primary Care at [facility], MD   4 months ago Preop general physical exam   Primary Care at [facility], MD   1 year ago Asthma, cough variant   Primary Care at [facility], Bayard Beaver, MD   1 year ago Asthma, cough variant   [place], Patrick E, MD   1 year ago Annual physical exam   Primary Care at [facility], Bayard Beaver, MD      Future Appointments            In 1 month Dorna Mai, MD Primary Care at [facility]

## 2021-10-31 NOTE — Therapy (Signed)
OUTPATIENT PHYSICAL THERAPY TREATMENT NOTE / PROGRESS NOTE   Patient Name: Judith Pollard MRN: [id number] DOB:April 24, 1966, 56 y.o., female Today's Date: 10/31/2021  PCP: Dorna Mai, MD                Past Medical History:  Diagnosis Date   Diabetes mellitus without complication ([place])    Hypertension    Past Surgical History:  Procedure Laterality Date   FRACTURE SURGERY     rt ring finger   SHOULDER ARTHROSCOPY WITH LABRAL REPAIR Left 07/11/2021   Procedure: Left SHOULDER ARTHROSCOPY WITH LABRAL REPAIR;  Surgeon: Vanetta Mulders, MD;  Location: [place];  Service: Orthopedics;  Laterality: Left;   Patient Active Problem List   Diagnosis Date Noted   Instability of left shoulder joint    Asthma, cough variant 08/05/2019   Prediabetes 06/27/2019   Essential hypertension 06/26/2019   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/26/2019   Non-seasonal allergic rhinitis 06/26/2019   Morbid obesity ([place]) 06/26/2019   Hyperglycemia 06/26/2019   REFERRING MD:  Vanetta Mulders, MD  REFERRING DIAG: s/p Lt shoulder labral repair  THERAPY DIAG:  No diagnosis found.  PERTINENT HISTORY: pre DM- per pt  PRECAUTIONS: shoulder, DOS 07/11/21  SUBJECTIVE:  Pt is 16 weeks and 1 day s/p L shoulder ant labral repair.  Pt denies any adverse effects after prior Rx.  Pt reports compliance with HEP.  Pt reports she is ready to go back to work after she sees MD.  Pt states her expected return to work date is 11/16/2021.  Pt is able to hold her coffee cup for awhile without hurting.  Pt is able to sleep on L side.  Pt reports improved mobility and reaching and is limited with reaching overhead.  Pt continues to use L UE more with daily activities.  Pt reports improved strength.    Pt reports minimal difficulty with self care activities and ADLs/IADLs.  Pt reports improved ability to put her hair up.  Pt is able to put some pressure on UE to get OOB and also getting out of tub.  Pt reports she is currently  at 60-65% of her normal.  Pt is still limited with reaching overhead, performing overhead activities, and also doffing shirt.  Pt has not been released to work activities.     PAIN:  Are you having pain? no NPRS scale: 0/10 Pain location: post shoulder  Pain description: aching  Aggravating factors: constant ache Relieving factors: meds   OBJECTIVE:   TODAY'S TREATMENT:   Therapeutic Exercise: Reviewed response to prior Rx, HEP compliance, pain level, and current function.   Pt performed Supine wand ER x 5 reps with 10 sec hold Supine rhythmic stabs at 90 deg flexion x1 min, at 60 deg x 1 min, 120 deg x 30 sec Supine shoulder ABC x1 rep with 2#  Prone horizontal abduction 2x10 reps  Prone extension 2x10 reps with 2# Standing 4D ball rolls on table x10 reps each and x 10 reps on wall Standing Jobe's flexion with 2# 2x10 reps  Standing scaption with 2# 2 x 10 reps  Standing wall walks AAROM in flexion x10 reps Standing ER with YTB and IR with RTB with arm at side with 2x10 each   Manual Therapy:  Pt received grade II post and inf jt mobs to improve pain, ROM, stiffness, and to normalize arthrokinematics -Pt received L shoulder PROM in flexion, abd, scaption, ER, and IR per protocols and pt/tissue tolerance. -PNF hold relax in supine  to improver ER ROM       PATIENT EDUCATION: Education details:  HEP including to added supine wand ER with 10 sec hold to improve ER ROM.  exercise form. Person educated: Patient Education method: Explanation, Demonstration, Tactile cues, Verbal cues, handout Education comprehension: verbalized understanding, returned demonstration, verbal cues required, tactile cues required, and needs further education     HOME EXERCISE PROGRAM: [id number]     ASSESSMENT:   CLINICAL IMPRESSION: Pt continues to progress well with functional usage of L UE and reports she is at 60-65% of her normal.  She still has tightness in shoulder ROM and is limited  with reaching overhead.  Pt had increased tightness t/o shoulder PROM today and continues to be very tight in ER.  Pt did have increased ROM with increased reps of PROM.  PT instructed pt to perform supine wand ER with 10 sec hold at home to improve ER ROM.  PT progressed exercises per protocol and pt performed well with instruction and cuing for correct form.  She is improving with strength as evidenced by performance and progression of exercises.  Pt responded well to Rx having no pain after Rx.  She should benefit from cont skilled PT services per protocol to address ongoing goals and to assist in restoring PLOF.    Objective impairments include decreased activity tolerance, decreased mobility, decreased ROM, decreased strength, increased edema, increased muscle spasms, impaired flexibility, impaired sensation, impaired UE functional use, improper body mechanics, and pain. These impairments are limiting patient from cleaning, community activity, meal prep, occupation, laundry, and shopping. Personal factors including DM, 1 comorbidity: pre-DM  are also affecting patient's functional outcome.    REHAB POTENTIAL: Good   CLINICAL DECISION MAKING: Stable/uncomplicated   EVALUATION COMPLEXITY: Low     GOALS:    SHORT TERM GOALS:   STG Name Target Date Goal status  1 Comfortable passive flexion to 90 deg  Baseline: not tested at eval 08/05/2021 achieved  2 Good periscapular activation with shoulder at neutral Baseline: will progress as appropriate 08/05/2021 achieved  3 Independent with donning/doffing sling Baseline:educated at eval 07/21/21   achieved  4 Independent with HEP as it has been developed in short term Baseline:will progress as appropriate 08/05/2021 achieved                               LONG TERM GOALS:    LTG Name Target Date Goal status  1 Good form in reaching Overhead Baseline:unable at eval 11/24/2021 PROGRESSING  2 Shoulder strength in dynamometry testing 90% of opposite  arm Baseline: 11/24/2021 PROGRESSING  3 Pt will verbalize readiness to return to work Baseline:will progress as appropriate 11/24/2021 PROGRESSING  4 FOTO to meet goal Baseline: will evaluate as PT progresses 11/24/2021 NOT MET  5 Independent in long term HEP for continued strengthening Baseline: 11/24/2021 PROGRESSING   6  Pt will demo L shoulder AROM to be [facility] t/o for performance of ADLs and IADLs 11/24/2021  INITIAL             PLAN: PT FREQUENCY:  2x/week   PT DURATION: 5 weeks   PLANNED INTERVENTIONS: Therapeutic exercises, Therapeutic activity, Neuro Muscular re-education, Patient/Family education, Joint mobilization, Aquatic Therapy, Dry Needling, Electrical stimulation, Spinal mobilization, Cryotherapy, Moist heat, Taping, Vasopneumatic device, and Manual therapy   PLAN FOR NEXT SESSION: Cont with strengthening and ROM per Dr. Eddie Dibbles surgical protocol.  Cont to work on ROM, especially ER.  Add UBE next visit.    Selinda Michaels III PT, DPT 10/31/21 10:14 AM

## 2021-11-01 ENCOUNTER — Encounter (HOSPITAL_BASED_OUTPATIENT_CLINIC_OR_DEPARTMENT_OTHER): Payer: Self-pay | Admitting: Physical Therapy

## 2021-11-01 ENCOUNTER — Ambulatory Visit (HOSPITAL_BASED_OUTPATIENT_CLINIC_OR_DEPARTMENT_OTHER): Payer: 59 | Admitting: Physical Therapy

## 2021-11-01 DIAGNOSIS — M25512 Pain in left shoulder: Secondary | ICD-10-CM

## 2021-11-01 DIAGNOSIS — M25612 Stiffness of left shoulder, not elsewhere classified: Secondary | ICD-10-CM

## 2021-11-01 DIAGNOSIS — M6281 Muscle weakness (generalized): Secondary | ICD-10-CM

## 2021-11-02 ENCOUNTER — Encounter (HOSPITAL_BASED_OUTPATIENT_CLINIC_OR_DEPARTMENT_OTHER): Payer: 59 | Admitting: Orthopaedic Surgery

## 2021-11-03 ENCOUNTER — Encounter (HOSPITAL_BASED_OUTPATIENT_CLINIC_OR_DEPARTMENT_OTHER): Payer: Self-pay | Admitting: Physical Therapy

## 2021-11-03 ENCOUNTER — Ambulatory Visit (HOSPITAL_BASED_OUTPATIENT_CLINIC_OR_DEPARTMENT_OTHER): Payer: Self-pay | Attending: Orthopaedic Surgery | Admitting: Physical Therapy

## 2021-11-03 DIAGNOSIS — M6281 Muscle weakness (generalized): Secondary | ICD-10-CM

## 2021-11-03 DIAGNOSIS — M25612 Stiffness of left shoulder, not elsewhere classified: Secondary | ICD-10-CM

## 2021-11-03 DIAGNOSIS — M25512 Pain in left shoulder: Secondary | ICD-10-CM

## 2021-11-03 NOTE — Therapy (Signed)
OUTPATIENT PHYSICAL THERAPY TREATMENT NOTE    Patient Name: Judith Pollard MRN: [id number] DOB:[birth year], 56 y.o., female Today's Date: 11/03/2021  PCP: Dorna Mai, MD   PT End of Session - 11/03/21 0854     Visit Number 23    Number of Visits 30    Date for PT Re-Evaluation 11/24/21    Authorization Type [employer]    PT Start Time 0852    PT Stop Time 0933    PT Time Calculation (min) 41 min    Activity Tolerance Patient tolerated treatment well    Behavior During Therapy [facility] for tasks assessed/performed                 Past Medical History:  Diagnosis Date   Diabetes mellitus without complication ([place])    Hypertension    Past Surgical History:  Procedure Laterality Date   FRACTURE SURGERY     rt ring finger   SHOULDER ARTHROSCOPY WITH LABRAL REPAIR Left 07/11/2021   Procedure: Left SHOULDER ARTHROSCOPY WITH LABRAL REPAIR;  Surgeon: Vanetta Mulders, MD;  Location: [place];  Service: Orthopedics;  Laterality: Left;   Patient Active Problem List   Diagnosis Date Noted   Instability of left shoulder joint    Asthma, cough variant 08/05/2019   Prediabetes 06/27/2019   Essential hypertension 06/26/2019   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/26/2019   Non-seasonal allergic rhinitis 06/26/2019   Morbid obesity ([place]) 06/26/2019   Hyperglycemia 06/26/2019   REFERRING MD:  Vanetta Mulders, MD  REFERRING DIAG: s/p Lt shoulder labral repair  THERAPY DIAG:  Acute pain of left shoulder  Muscle weakness (generalized)  Stiffness of left shoulder, not elsewhere classified  PERTINENT HISTORY: pre DM- per pt  PRECAUTIONS: shoulder, DOS 07/11/21  SUBJECTIVE:  Pt is 16 weeks and 3 days s/p L shoulder ant labral repair.  Pt denies any adverse effects after prior Rx.  Pt reports compliance with HEP.  Pt had to change her MD appt to 6/7.  Pt states her expected return to work date is 11/16/2021.  Pt reports improved mobility and reaching.  Pt continues to use L UE more with daily  activities.  Pt reports improved strength.    Pt reports minimal difficulty with self care activities and ADLs/IADLs.  Pt is able to put some pressure on UE to get OOB and also getting out of tub.  Pt is limited with reaching overhead, performing overhead activities, and also doffing shirt.  She is limited with reaching behind her head and neck.  Pt has not been released to work activities.     PAIN:  Are you having pain? no NPRS scale: 0/10 Pain location: L shoulder  Pain description: aching  Aggravating factors: constant ache Relieving factors: meds   OBJECTIVE:   TODAY'S TREATMENT:   Therapeutic Exercise: Reviewed response to prior Rx, HEP compliance, pain level, and current function.   Pt performed UBE x 2 mins each fwd/bwd  Prone horizontal abduction 2x10 reps  Prone extension 2x10 reps with 2# S/L ER with 1# 2x15 Standing 4D ball rolls 2 x 10 reps on wall Standing shoulder ABC x 1 rep Standing Jobe's flexion with 2# 3x10 reps  Standing scaption with 2# 3 x 10 reps  Standing wall walks AAROM in flexion x5 reps Standing ER with RTB 2x10 reps and IR with RTB with arm at side with 2x10  Shelf reach 2x10 reps   Manual Therapy:  Pt received grade II post and inf jt mobs to improve  pain, ROM, stiffness, and to normalize arthrokinematics -Pt received L shoulder PROM in flexion, abd, scaption, ER, and IR per protocols and pt/tissue tolerance. -PNF hold relax in supine to improver ER ROM       PATIENT EDUCATION: Education details:  HEP, POC, and exercise form.  Instructed pt to ice shoulder later if having soreness or pain Person educated: Patient Education method: Explanation, Demonstration, Tactile cues, Verbal cues, handout Education comprehension: verbalized understanding, returned demonstration, verbal cues required, tactile cues required, and needs further education     HOME EXERCISE PROGRAM: [id number]     ASSESSMENT:   CLINICAL IMPRESSION: Pt is making progress  in all areas including strength, ROM, and function.  She continues to use her L UE more with daily activities.  Pt is improving with L shoulder ROM though continues to have tightness and limitations.  She demonstrates improved ROM with increased reps of PROM.  She continues to be very tight in ER though demonstrates improved ROM and reduced tightness in ER today compared to prior Rx.  PT progressed exercises today and Pt did well.   Pt is improving with strength t/o L shoulder as evidenced by performance and progression of exercises per protocol.  She performed exercises well and was able to perform S/L ER with 1# weight today.  Pt continues to have difficulty and weakness with prone horizontal abduction having limited ROM with exercise.  Pt instructed in home exercises to improver shoulder mobility and ER ROM.  Pt responded well to Rx reporting soreness but no pain after Rx.  She should cont to benefit from cont skilled PT services per protocol to address ongoing goals and to assist in restoring PLOF.      Objective impairments include decreased activity tolerance, decreased mobility, decreased ROM, decreased strength, increased edema, increased muscle spasms, impaired flexibility, impaired sensation, impaired UE functional use, improper body mechanics, and pain. These impairments are limiting patient from cleaning, community activity, meal prep, occupation, laundry, and shopping. Personal factors including DM, 1 comorbidity: pre-DM  are also affecting patient's functional outcome.    REHAB POTENTIAL: Good   CLINICAL DECISION MAKING: Stable/uncomplicated   EVALUATION COMPLEXITY: Low     GOALS:    SHORT TERM GOALS:   STG Name Target Date Goal status  1 Comfortable passive flexion to 90 deg  Baseline: not tested at eval 08/05/2021 achieved  2 Good periscapular activation with shoulder at neutral Baseline: will progress as appropriate 08/05/2021 achieved  3 Independent with donning/doffing  sling Baseline:educated at eval 07/21/21   achieved  4 Independent with HEP as it has been developed in short term Baseline:will progress as appropriate 08/05/2021 achieved                               LONG TERM GOALS:    LTG Name Target Date Goal status  1 Good form in reaching Overhead Baseline:unable at eval 11/24/2021 PROGRESSING  2 Shoulder strength in dynamometry testing 90% of opposite arm Baseline: 11/24/2021 PROGRESSING  3 Pt will verbalize readiness to return to work Baseline:will progress as appropriate 11/24/2021 PROGRESSING  4 FOTO to meet goal Baseline: will evaluate as PT progresses 11/24/2021 NOT MET  5 Independent in long term HEP for continued strengthening Baseline: 11/24/2021 PROGRESSING   6  Pt will demo L shoulder AROM to be [facility] t/o for performance of ADLs and IADLs 11/24/2021  INITIAL  PLAN: PT FREQUENCY:  2x/week   PT DURATION: 5 weeks   PLANNED INTERVENTIONS: Therapeutic exercises, Therapeutic activity, Neuro Muscular re-education, Patient/Family education, Joint mobilization, Aquatic Therapy, Dry Needling, Electrical stimulation, Spinal mobilization, Cryotherapy, Moist heat, Taping, Vasopneumatic device, and Manual therapy   PLAN FOR NEXT SESSION: Cont with strengthening and ROM per Dr. Eddie Dibbles surgical protocol.  Cont to work on ROM, especially ER.     Selinda Michaels III PT, DPT 11/03/21 10:21 AM

## 2021-11-07 ENCOUNTER — Encounter (HOSPITAL_BASED_OUTPATIENT_CLINIC_OR_DEPARTMENT_OTHER): Payer: Self-pay | Admitting: Physical Therapy

## 2021-11-07 ENCOUNTER — Ambulatory Visit (HOSPITAL_BASED_OUTPATIENT_CLINIC_OR_DEPARTMENT_OTHER): Payer: Self-pay | Admitting: Physical Therapy

## 2021-11-07 DIAGNOSIS — M25612 Stiffness of left shoulder, not elsewhere classified: Secondary | ICD-10-CM

## 2021-11-07 DIAGNOSIS — M6281 Muscle weakness (generalized): Secondary | ICD-10-CM

## 2021-11-07 DIAGNOSIS — M25512 Pain in left shoulder: Secondary | ICD-10-CM

## 2021-11-07 NOTE — Therapy (Signed)
OUTPATIENT PHYSICAL THERAPY TREATMENT NOTE    Patient Name: Judith Pollard MRN: [id number] DOB:1965/07/08, 56 y.o., female Today's Date: 11/07/2021  PCP: Dorna Mai, MD   PT End of Session - 11/07/21 1429     Visit Number 24    Number of Visits 30    Date for PT Re-Evaluation 11/24/21    Authorization Type UHC    PT Start Time 8891    PT Stop Time 1506    PT Time Calculation (min) 38 min    Activity Tolerance Patient tolerated treatment well    Behavior During Therapy WFL for tasks assessed/performed                 Past Medical History:  Diagnosis Date   Diabetes mellitus without complication ([place])    Hypertension    Past Surgical History:  Procedure Laterality Date   FRACTURE SURGERY     rt ring finger   SHOULDER ARTHROSCOPY WITH LABRAL REPAIR Left 07/11/2021   Procedure: Left SHOULDER ARTHROSCOPY WITH LABRAL REPAIR;  Surgeon: Vanetta Mulders, MD;  Location: [facility];  Service: Orthopedics;  Laterality: Left;   Patient Active Problem List   Diagnosis Date Noted   Instability of left shoulder joint    Asthma, cough variant 08/05/2019   Prediabetes 06/27/2019   Essential hypertension 06/26/2019   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/26/2019   Non-seasonal allergic rhinitis 06/26/2019   Morbid obesity ([place]) 06/26/2019   Hyperglycemia 06/26/2019   REFERRING MD:  Vanetta Mulders, MD  REFERRING DIAG: s/p Lt shoulder labral repair  THERAPY DIAG:  Acute pain of left shoulder  Muscle weakness (generalized)  Stiffness of left shoulder, not elsewhere classified  Left shoulder pain, unspecified chronicity  PERTINENT HISTORY: pre DM- per pt  PRECAUTIONS: shoulder, DOS 07/11/21  SUBJECTIVE:  I see MD on Wed. I feel like I am about 70%. Still cant reach overhead all the way.   PAIN:  Are you having pain? no NPRS scale: 0/10 Pain location: L shoulder  Pain description: aching  Aggravating factors: constant ache Relieving factors: meds   OBJECTIVE:    TODAY'S TREATMENT:   6/5: Supine resisted flexion- yellow tband anchored at Rt foot Sidelying horiz abd 1lb- tactile cues for scap retraction Sidelying external rotation 1lb Standing bent over triceps ext 1lb In mirror- flexion & ABD PT assist at end range ; also done with wall walk for end range assist MANUAL: STM Lt upper trap, GHJ distraction, passive flexion, surface edema mobilization       PATIENT EDUCATION: Education details:  HEP, POC, and exercise form.  Instructed pt to ice shoulder later if having soreness or pain Person educated: Patient Education method: Explanation, Demonstration, Tactile cues, Verbal cues, handout Education comprehension: verbalized understanding, returned demonstration, verbal cues required, tactile cues required, and needs further education     HOME EXERCISE PROGRAM: [id number]     ASSESSMENT:   CLINICAL IMPRESSION: Flexion to 130, abduction to 105; stretchy end feel. Pt reports she still feels puffy on the anterior shoulder and it feels like that is what is stopping her from getting her range higher.      Objective impairments include decreased activity tolerance, decreased mobility, decreased ROM, decreased strength, increased edema, increased muscle spasms, impaired flexibility, impaired sensation, impaired UE functional use, improper body mechanics, and pain. These impairments are limiting patient from cleaning, community activity, meal prep, occupation, laundry, and shopping. Personal factors including DM, 1 comorbidity: pre-DM  are also affecting patient's functional outcome.  REHAB POTENTIAL: Good   CLINICAL DECISION MAKING: Stable/uncomplicated   EVALUATION COMPLEXITY: Low     GOALS:    SHORT TERM GOALS:   STG Name Target Date Goal status  1 Comfortable passive flexion to 90 deg  Baseline: not tested at eval 08/05/2021 achieved  2 Good periscapular activation with shoulder at neutral Baseline: will progress as appropriate  08/05/2021 achieved  3 Independent with donning/doffing sling Baseline:educated at eval 07/21/21   achieved  4 Independent with HEP as it has been developed in short term Baseline:will progress as appropriate 08/05/2021 achieved                               LONG TERM GOALS:    LTG Name Target Date Goal status  1 Good form in reaching Overhead Baseline:unable at eval 11/24/2021 PROGRESSING  2 Shoulder strength in dynamometry testing 90% of opposite arm Baseline: 11/24/2021 PROGRESSING  3 Pt will verbalize readiness to return to work Baseline:will progress as appropriate 11/24/2021 PROGRESSING  4 FOTO to meet goal Baseline: will evaluate as PT progresses 11/24/2021 NOT MET  5 Independent in long term HEP for continued strengthening Baseline: 11/24/2021 PROGRESSING   6  Pt will demo L shoulder AROM to be [facility] t/o for performance of ADLs and IADLs 11/24/2021  INITIAL             PLAN: PT FREQUENCY:  2x/week   PT DURATION: 5 weeks   PLANNED INTERVENTIONS: Therapeutic exercises, Therapeutic activity, Neuro Muscular re-education, Patient/Family education, Joint mobilization, Aquatic Therapy, Dry Needling, Electrical stimulation, Spinal mobilization, Cryotherapy, Moist heat, Taping, Vasopneumatic device, and Manual therapy   PLAN FOR NEXT SESSION: Cont with strengthening and ROM per Dr. Eddie Dibbles surgical protocol.  Cont to work on ROM, especially ER.     Theodis Kinsel C. Theotis Gerdeman PT, DPT 11/07/21 3:11 PM

## 2021-11-09 ENCOUNTER — Ambulatory Visit (INDEPENDENT_AMBULATORY_CARE_PROVIDER_SITE_OTHER): Payer: 59 | Admitting: Orthopaedic Surgery

## 2021-11-09 DIAGNOSIS — M25312 Other instability, left shoulder: Secondary | ICD-10-CM

## 2021-11-09 NOTE — Progress Notes (Signed)
Post Operative Evaluation    Procedure/Date of Surgery: left shoulder arthroscopic labral repair 07/11/21  Interval History:   11/09/2021: Presents today for 42-month follow-up overall doing very well.  She is continuing to work through physical therapy.  At this time she has no pain.  She is hoping to return to work.   PMH/PSH/Family History/Social History/Meds/Allergies:    Past Medical History:  Diagnosis Date   Diabetes mellitus without complication (HCC)    Hypertension    Past Surgical History:  Procedure Laterality Date   FRACTURE SURGERY     rt ring finger   SHOULDER ARTHROSCOPY WITH LABRAL REPAIR Left 07/11/2021   Procedure: Left SHOULDER ARTHROSCOPY WITH LABRAL REPAIR;  Surgeon: Huel Cote, MD;  Location: [facility] OR;  Service: Orthopedics;  Laterality: Left;   Social History   Socioeconomic History   Marital status: Single    Spouse name: Not on file   Number of children: Not on file   Years of education: Not on file   Highest education level: Not on file  Occupational History   Not on file  Tobacco Use   Smoking status: Former    Types: Cigarettes    Quit date: 12/16/2018    Years since quitting: 2.9   Smokeless tobacco: Never  Vaping Use   Vaping Use: Never used  Substance and Sexual Activity   Alcohol use: Yes    Comment: social   Drug use: Never   Sexual activity: Not Currently  Other Topics Concern   Not on file  Social History Narrative   Not on file   Social Determinants of Health   Financial Resource Strain: Not on file  Food Insecurity: Not on file  Transportation Needs: Not on file  Physical Activity: Not on file  Stress: Not on file  Social Connections: Not on file   Family History  Family history unknown: Yes   No Known Allergies Current Outpatient Medications  Medication Sig Dispense Refill   acetaminophen (TYLENOL) 500 MG tablet Take 1,000 mg by mouth every 6 (six) hours as needed for headache or  moderate pain.     Albuterol Sulfate (PROAIR RESPICLICK) 108 (90 Base) MCG/ACT AEPB Inhale 2 puffs into the lungs every 6 (six) hours as needed. 1 each 4   amLODipine (NORVASC) 5 MG tablet TAKE 1 TABLET(5 MG) BY MOUTH DAILY 30 tablet 0   aspirin EC 325 MG tablet Take 1 tablet (325 mg total) by mouth daily. 30 tablet 0   cetirizine (ZYRTEC) 10 MG tablet Take 1 tablet (10 mg total) by mouth daily. (Patient not taking: Reported on 07/04/2021) 30 tablet 11   fluticasone (FLOVENT HFA) 44 MCG/ACT inhaler Inhale 2 puffs into the lungs in the morning and at bedtime. (Patient not taking: Reported on 07/04/2021) 1 Inhaler 12   HYDROmorphone (DILAUDID) 2 MG tablet Take 1 tablet (2 mg total) by mouth every 4 (four) hours as needed for severe pain. 30 tablet 0   losartan-hydrochlorothiazide (HYZAAR) 100-25 MG tablet Take 1 tablet by mouth daily. 90 tablet 0   Melatonin 1 MG CAPS Take 1 capsule (1 mg total) by mouth at bedtime. 30 capsule 0   metFORMIN (GLUCOPHAGE-XR) 500 MG 24 hr tablet Take 1 tablet (500 mg total) by mouth daily with breakfast. 90 tablet 1   methocarbamol (ROBAXIN) 500 MG  tablet Take 1 tablet (500 mg total) by mouth in the morning and at bedtime. 30 tablet 2   oxyCODONE (OXY IR/ROXICODONE) 5 MG immediate release tablet Take 1 tablet (5 mg total) by mouth every 4 (four) hours as needed (severe pain). 20 tablet 0   No current facility-administered medications for this visit.   No results found.  Review of Systems:   A ROS was performed including pertinent positives and negatives as documented in the HPI.   Musculoskeletal Exam:    Last menstrual period 05/19/2013.  Left shoulder portal incisions are clean dry and intact.  Active range of motion about the left shoulder is to 140  External rotation at the side actively is to approximately 30 compared to 45 on the contralateral side.  Internal rotation is to the level of the sacrum.  Stable to flex and extend the left wrist.  2+ radial  pulse.  Imaging:    None  I personally reviewed and interpreted the radiographs.   Assessment:   4 months  status post left shoulder labral repair.  Overall she continues to make significant improvements.  I would like her to continue her scheduled physical therapy.  I do believe that it is reasonable at this time to return to work without restriction.  I have provided a work note.  I will see her back in 8 weeks for final check Plan :    -Return to clinic in 8 weeks  I personally saw and evaluated the patient, and participated in the management and treatment plan.  Huel Cote, MD Attending Physician, Orthopedic Surgery  This document was dictated using Dragon voice recognition software. A reasonable attempt at proof reading has been made to minimize errors.

## 2021-11-10 ENCOUNTER — Telehealth: Payer: Self-pay | Admitting: Orthopaedic Surgery

## 2021-11-10 NOTE — Telephone Encounter (Signed)
11/09/21 RTW note faxed to [facility] [phone]

## 2021-11-29 ENCOUNTER — Ambulatory Visit (HOSPITAL_BASED_OUTPATIENT_CLINIC_OR_DEPARTMENT_OTHER): Payer: Self-pay | Admitting: Physical Therapy

## 2021-12-01 ENCOUNTER — Ambulatory Visit (INDEPENDENT_AMBULATORY_CARE_PROVIDER_SITE_OTHER): Payer: Self-pay | Admitting: Family Medicine

## 2021-12-01 ENCOUNTER — Encounter: Payer: Self-pay | Admitting: Family Medicine

## 2021-12-01 ENCOUNTER — Ambulatory Visit (HOSPITAL_BASED_OUTPATIENT_CLINIC_OR_DEPARTMENT_OTHER): Payer: Self-pay | Admitting: Physical Therapy

## 2021-12-01 VITALS — BP 130/87 | HR 100 | Temp 98.1°F | Resp 16 | Wt 236.0 lb

## 2021-12-01 DIAGNOSIS — N950 Postmenopausal bleeding: Secondary | ICD-10-CM

## 2021-12-01 DIAGNOSIS — Z6841 Body Mass Index (BMI) 40.0 and over, adult: Secondary | ICD-10-CM

## 2021-12-01 DIAGNOSIS — R7303 Prediabetes: Secondary | ICD-10-CM

## 2021-12-01 DIAGNOSIS — M25473 Effusion, unspecified ankle: Secondary | ICD-10-CM

## 2021-12-01 DIAGNOSIS — I10 Essential (primary) hypertension: Secondary | ICD-10-CM

## 2021-12-01 LAB — POCT GLYCOSYLATED HEMOGLOBIN (HGB A1C): Hemoglobin A1C: 5.8 % — AB (ref 4.0–5.6)

## 2021-12-01 NOTE — Progress Notes (Signed)
Patient is here for complete physical examination Patient c/o her ankle swelling. Patient c/o her period coming on after 49yrs and it was a full week.

## 2021-12-02 ENCOUNTER — Encounter: Payer: Self-pay | Admitting: Family Medicine

## 2021-12-02 NOTE — Progress Notes (Signed)
Established Patient Office Visit  Subjective    Patient ID: KEMI GELL, female    DOB: 1966/06/01  Age: 56 y.o. MRN: [id number]  CC:  Chief Complaint  Patient presents with   Annual Exam    HPI Judith Pollard presents for complaint of vaginal bleeding after not having a period for 15 years. She also reports intermittent ankle swelling that is worse at night and improved upon awakening in the morning.   Outpatient Encounter Medications as of 12/01/2021  Medication Sig   acetaminophen (TYLENOL) 500 MG tablet Take 1,000 mg by mouth every 6 (six) hours as needed for headache or moderate pain.   Albuterol Sulfate (PROAIR RESPICLICK) 108 (90 Base) MCG/ACT AEPB Inhale 2 puffs into the lungs every 6 (six) hours as needed.   amLODipine (NORVASC) 5 MG tablet TAKE 1 TABLET(5 MG) BY MOUTH DAILY   aspirin EC 325 MG tablet Take 1 tablet (325 mg total) by mouth daily.   HYDROmorphone (DILAUDID) 2 MG tablet Take 1 tablet (2 mg total) by mouth every 4 (four) hours as needed for severe pain.   losartan-hydrochlorothiazide (HYZAAR) 100-25 MG tablet Take 1 tablet by mouth daily.   Melatonin 1 MG CAPS Take 1 capsule (1 mg total) by mouth at bedtime.   metFORMIN (GLUCOPHAGE-XR) 500 MG 24 hr tablet Take 1 tablet (500 mg total) by mouth daily with breakfast.   methocarbamol (ROBAXIN) 500 MG tablet Take 1 tablet (500 mg total) by mouth in the morning and at bedtime.   oxyCODONE (OXY IR/ROXICODONE) 5 MG immediate release tablet Take 1 tablet (5 mg total) by mouth every 4 (four) hours as needed (severe pain).   cetirizine (ZYRTEC) 10 MG tablet Take 1 tablet (10 mg total) by mouth daily. (Patient not taking: Reported on 07/04/2021)   fluticasone (FLOVENT HFA) 44 MCG/ACT inhaler Inhale 2 puffs into the lungs in the morning and at bedtime. (Patient not taking: Reported on 07/04/2021)   No facility-administered encounter medications on file as of 12/01/2021.    Past Medical History:  Diagnosis Date   Diabetes  mellitus without complication (HCC)    Hypertension     Past Surgical History:  Procedure Laterality Date   FRACTURE SURGERY     rt ring finger   SHOULDER ARTHROSCOPY WITH LABRAL REPAIR Left 07/11/2021   Procedure: Left SHOULDER ARTHROSCOPY WITH LABRAL REPAIR;  Surgeon: Huel Cote, MD;  Location: [facility] OR;  Service: Orthopedics;  Laterality: Left;    Family History  Family history unknown: Yes    Social History   Socioeconomic History   Marital status: Single    Spouse name: Not on file   Number of children: Not on file   Years of education: Not on file   Highest education level: Not on file  Occupational History   Not on file  Tobacco Use   Smoking status: Former    Types: Cigarettes    Quit date: 12/16/2018    Years since quitting: 2.9   Smokeless tobacco: Never  Vaping Use   Vaping Use: Never used  Substance and Sexual Activity   Alcohol use: Yes    Comment: social   Drug use: Never   Sexual activity: Not Currently  Other Topics Concern   Not on file  Social History Narrative   Not on file   Social Determinants of Health   Financial Resource Strain: Not on file  Food Insecurity: Not on file  Transportation Needs: Not on file  Physical Activity: Not on  file  Stress: Not on file  Social Connections: Not on file  Intimate Partner Violence: Not on file    Review of Systems  All other systems reviewed and are negative.       Objective    BP 130/87   Pulse 100   Temp 98.1 F (36.7 C) (Oral)   Resp 16   Wt 236 lb (107 kg)   LMP 05/19/2013   SpO2 94%   BMI 40.51 kg/m   Physical Exam Vitals and nursing note reviewed.  Constitutional:      General: She is not in acute distress.    Appearance: She is obese.  Cardiovascular:     Rate and Rhythm: Normal rate and regular rhythm.  Pulmonary:     Effort: Pulmonary effort is normal.     Breath sounds: Normal breath sounds.  Abdominal:     Palpations: Abdomen is soft.     Tenderness: There is no  abdominal tenderness.  Neurological:     General: No focal deficit present.     Mental Status: She is alert and oriented to person, place, and time.         Assessment & Plan:   1. Prediabetes A1c at goal. Continue present management and monitor - POCT glycosylated hemoglobin (Hb A1C)  2. Post-menopausal bleeding Referral to gyn for further eval/mgt - Ambulatory referral to Gynecology  3. Essential hypertension Appears stable with present management. Continue and monitor  4. Morbid obesity (HCC) Discussed dietary and activity options. Goal is 3-5lbs/mo wt loss  5. Ankle swelling, unspecified laterality Discussed utilizing support hose and elevation of legs . Monitor     Return in about 6 months (around 06/02/2022) for follow up.   [name], MD

## 2021-12-05 ENCOUNTER — Ambulatory Visit (HOSPITAL_BASED_OUTPATIENT_CLINIC_OR_DEPARTMENT_OTHER): Payer: 59 | Admitting: Physical Therapy

## 2021-12-07 ENCOUNTER — Telehealth (HOSPITAL_BASED_OUTPATIENT_CLINIC_OR_DEPARTMENT_OTHER): Payer: Self-pay | Admitting: Physical Therapy

## 2021-12-07 NOTE — Telephone Encounter (Signed)
Contacted pt regarding no show for 7/3 appointment, unable to LVM as box is full.  Judith Pollard C. [name] PT, DPT 12/07/21 1:24 PM

## 2021-12-08 ENCOUNTER — Ambulatory Visit (HOSPITAL_BASED_OUTPATIENT_CLINIC_OR_DEPARTMENT_OTHER): Payer: 59 | Admitting: Physical Therapy

## 2021-12-12 ENCOUNTER — Encounter (HOSPITAL_BASED_OUTPATIENT_CLINIC_OR_DEPARTMENT_OTHER): Payer: 59 | Admitting: Physical Therapy

## 2021-12-15 ENCOUNTER — Encounter (HOSPITAL_BASED_OUTPATIENT_CLINIC_OR_DEPARTMENT_OTHER): Payer: 59 | Admitting: Physical Therapy

## 2022-01-11 ENCOUNTER — Ambulatory Visit (HOSPITAL_BASED_OUTPATIENT_CLINIC_OR_DEPARTMENT_OTHER): Payer: 59 | Admitting: Orthopaedic Surgery

## 2022-03-21 ENCOUNTER — Other Ambulatory Visit (HOSPITAL_COMMUNITY): Payer: Self-pay

## 2023-03-23 IMAGING — DX DG SHOULDER 2+V*L*
3 series · 3 of 3 positions shown · non-contrast
Comparison: None.

CLINICAL DATA: Initial evaluation for acute trauma, fall.

EXAM:
LEFT SHOULDER - 2+ VIEW

[shoulder grashey (1 of 2)]
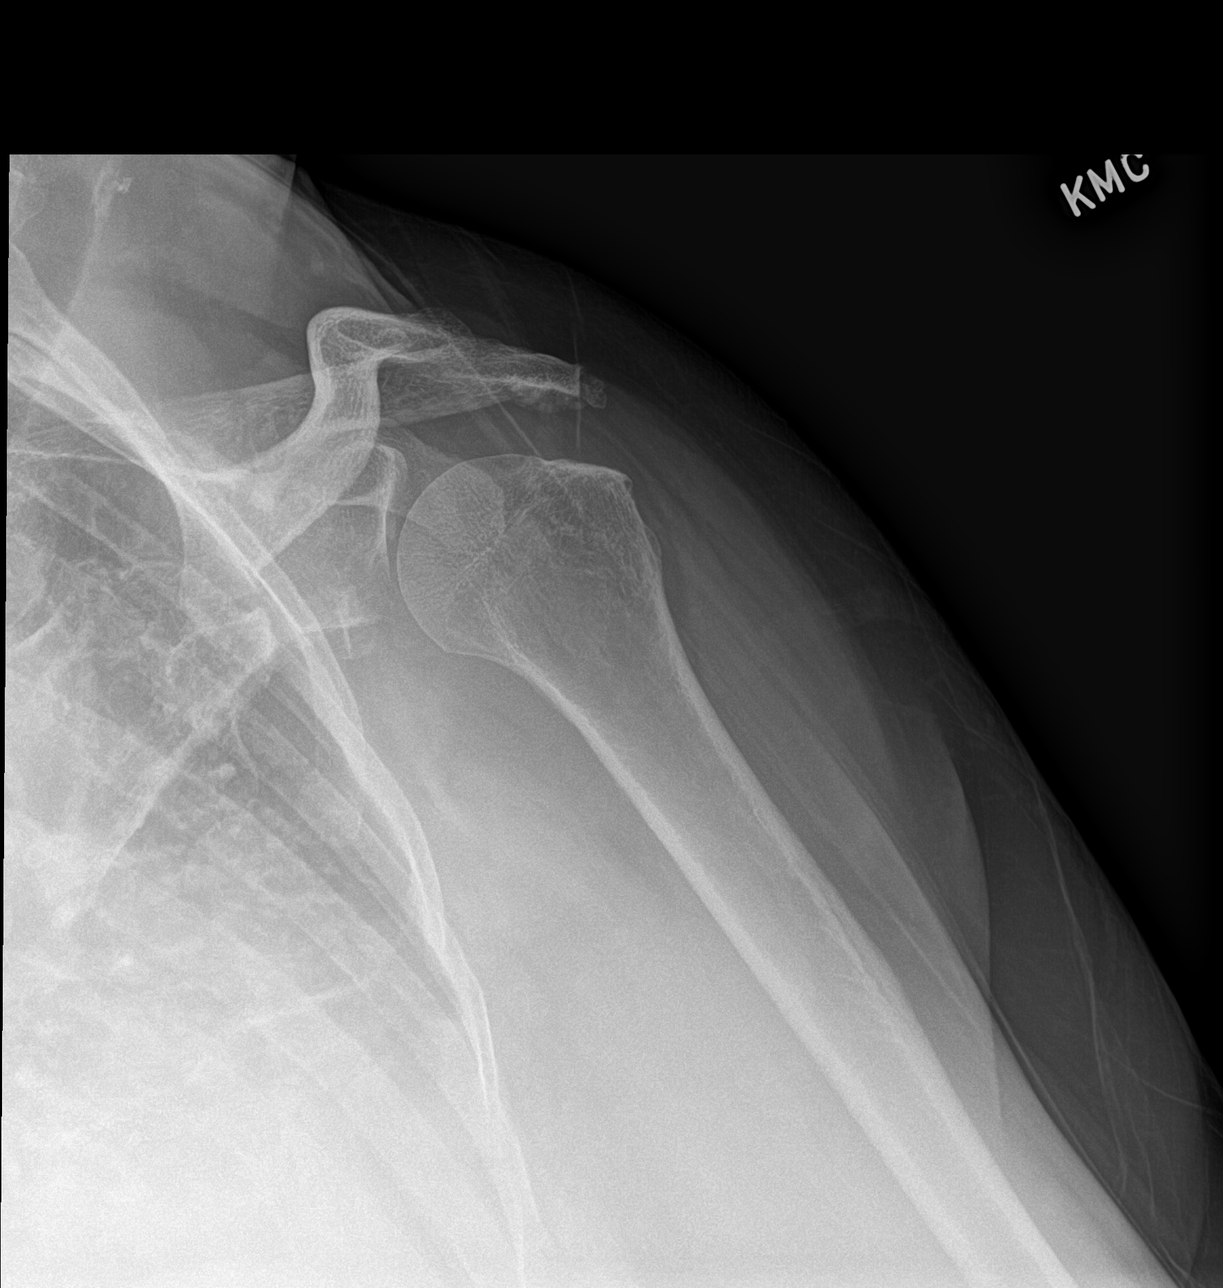

[shoulder y view]
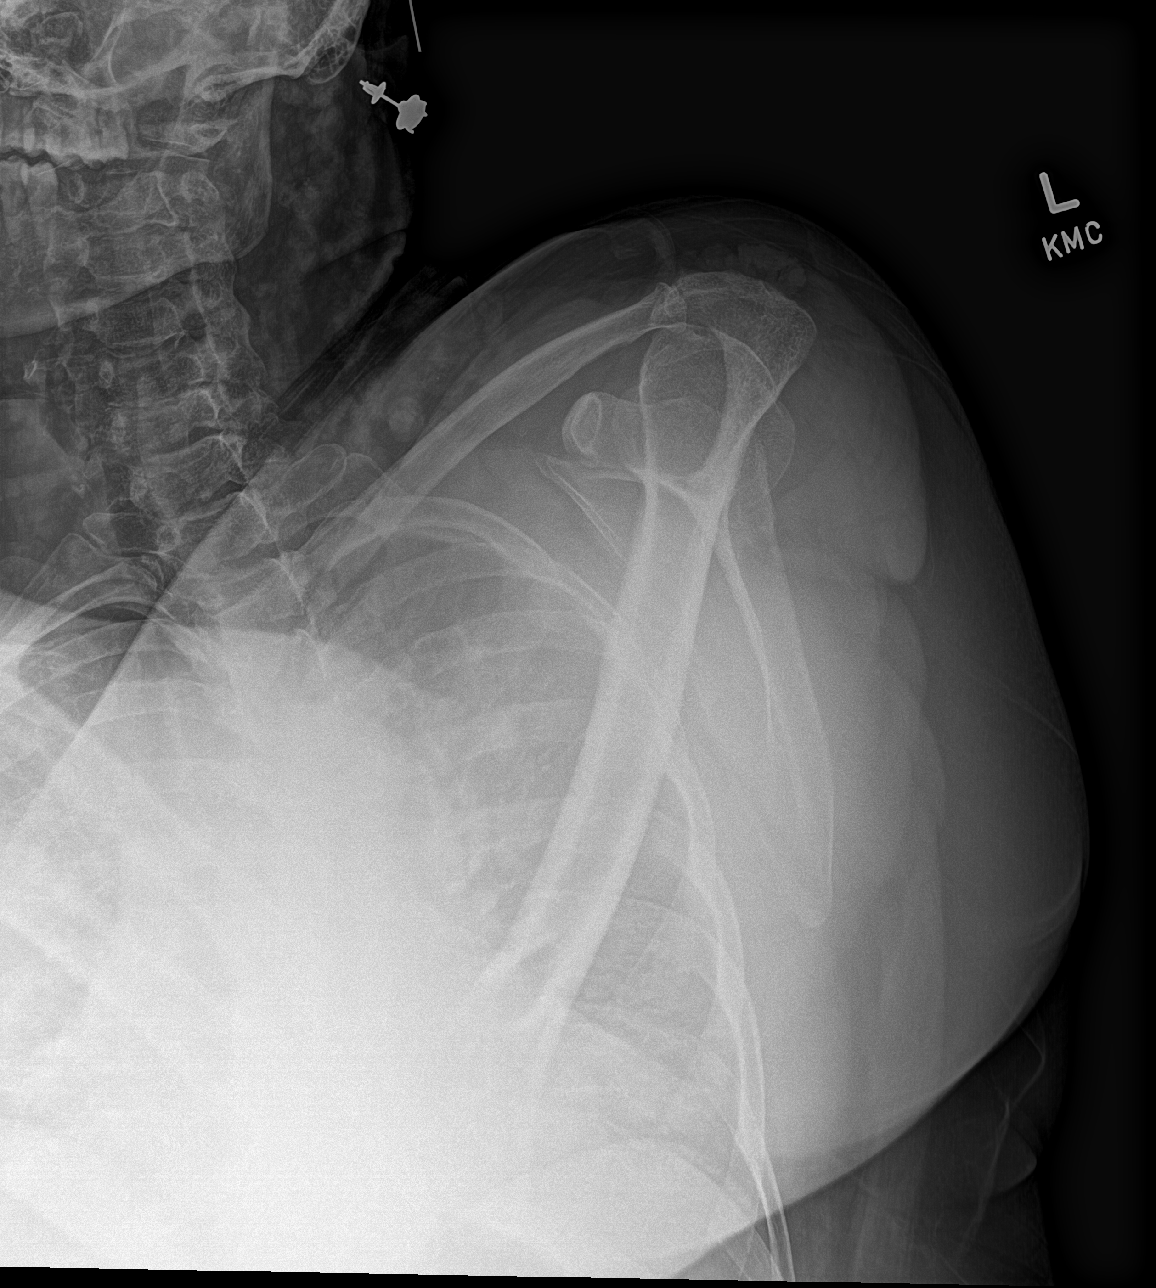

[shoulder grashey (2 of 2)]
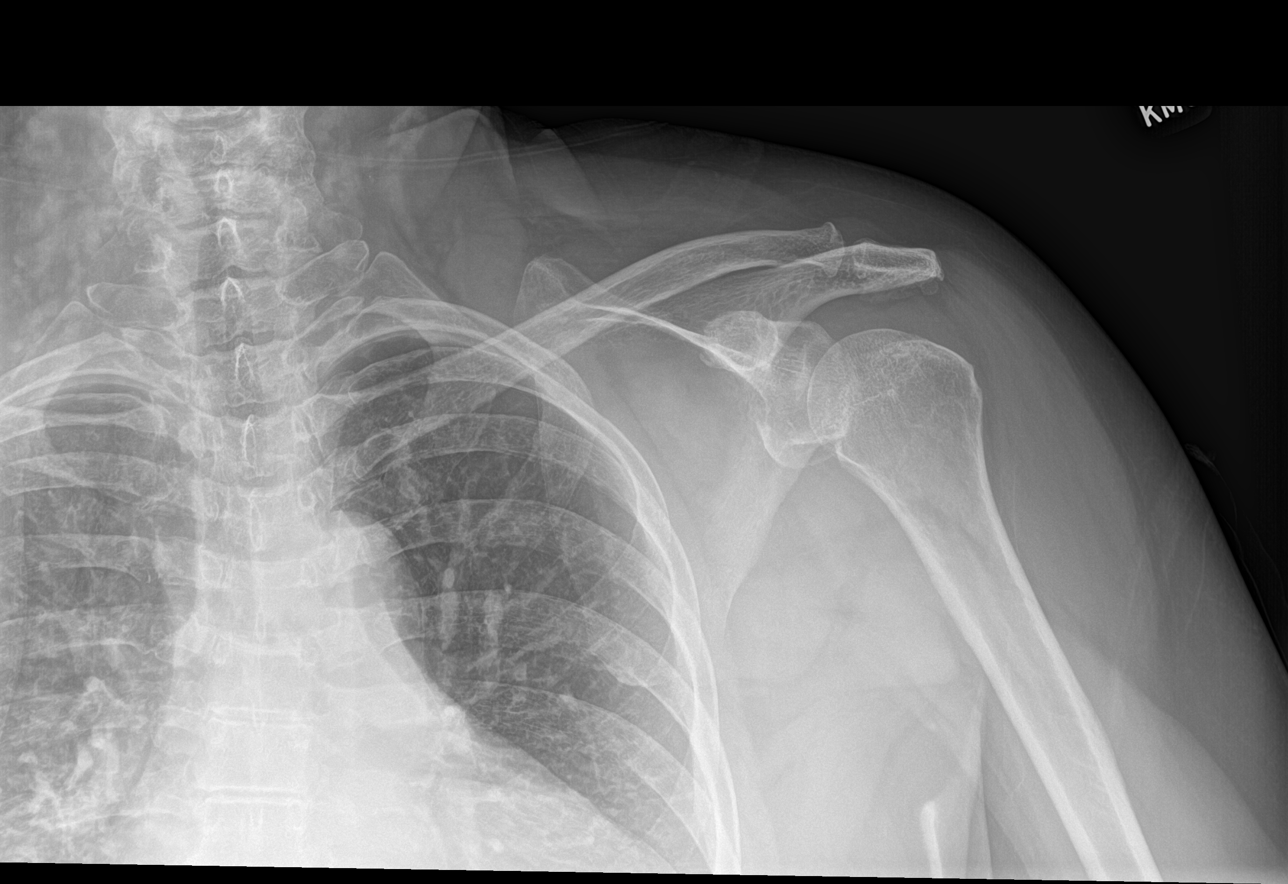

[3 of 3 positions shown; findings below may reference images not displayed]

FINDINGS: No acute fracture dislocation. Glenohumeral and acromioclavicular
articulations intact. Scattered osteoarthritic changes noted about
the shoulder. No soft tissue abnormality. Visualized left hemithorax
is clear.
IMPRESSION: No acute osseous abnormality about the left shoulder.

## 2023-03-23 IMAGING — DX DG WRIST COMPLETE 3+V*L*
4 series · 4 of 4 positions shown · non-contrast
Comparison: None.

CLINICAL DATA: Initial evaluation for acute trauma, fall.

EXAM:
LEFT WRIST - COMPLETE 3+ VIEW

[wrist ap]
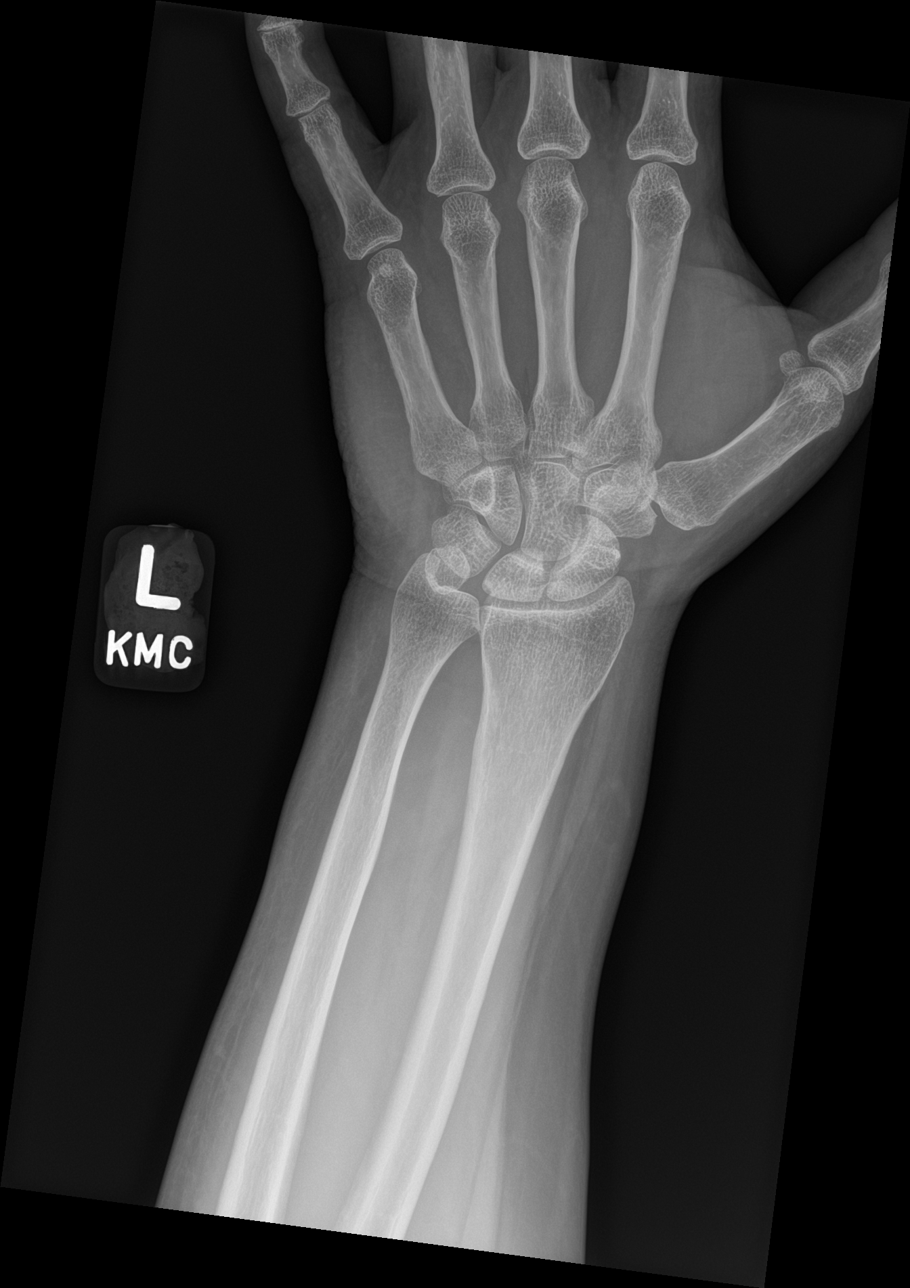

[wrist obl]
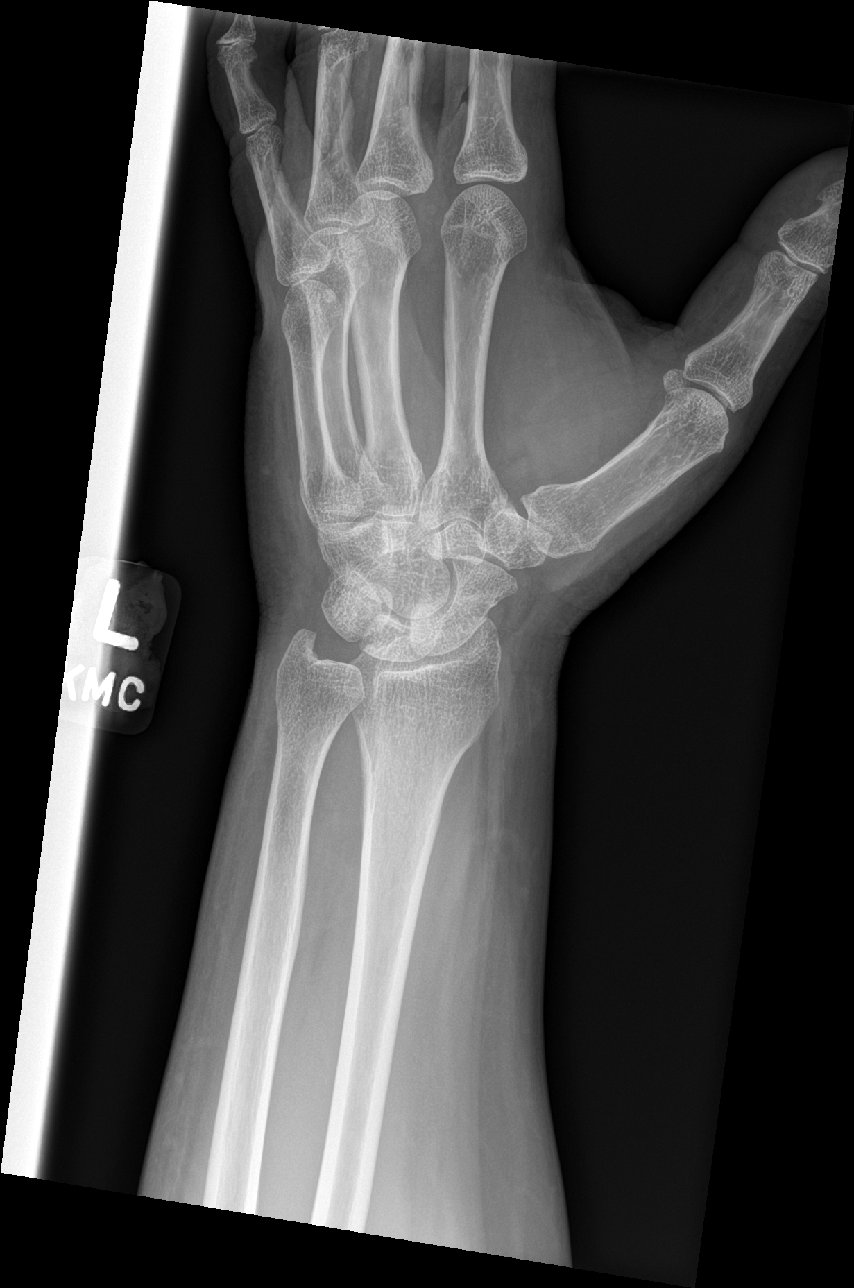

[wrist lat]
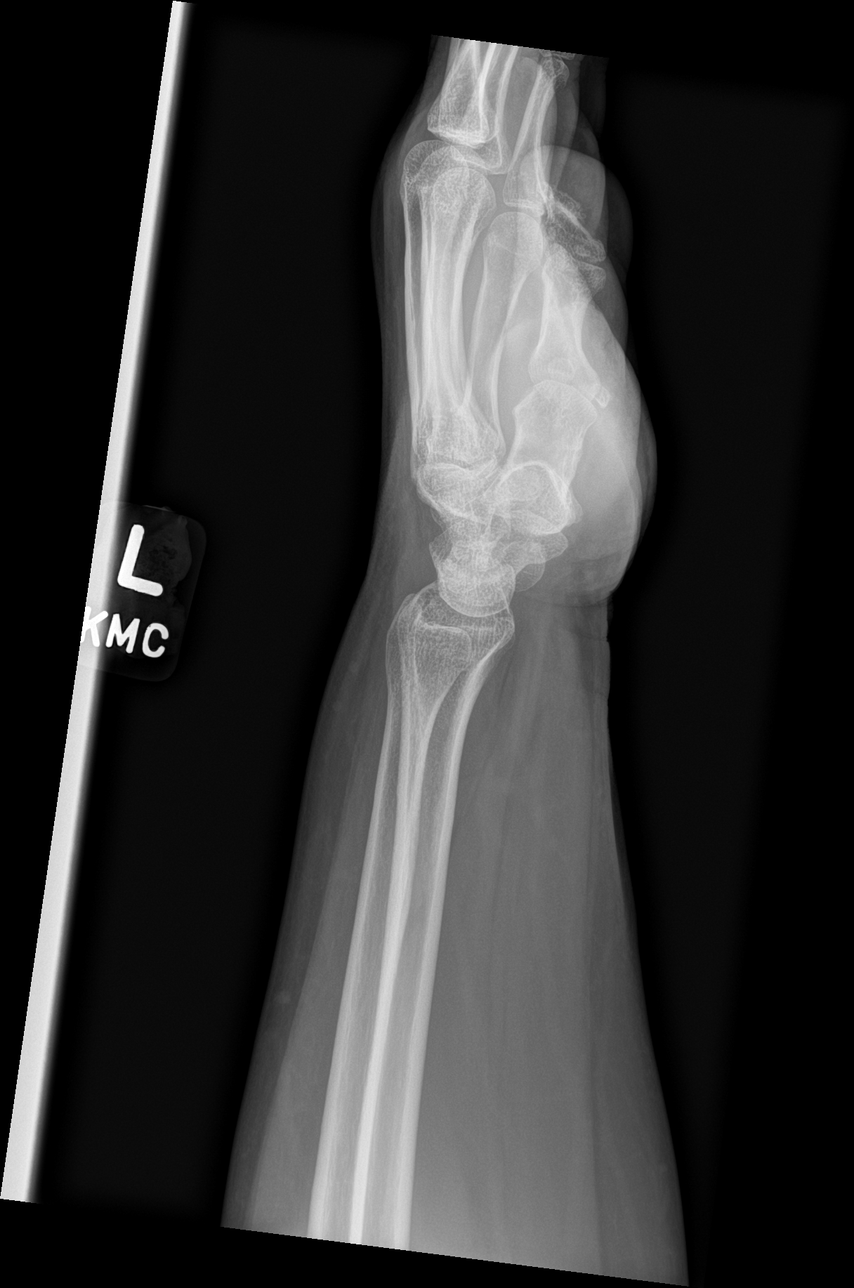

[wrist navicular]
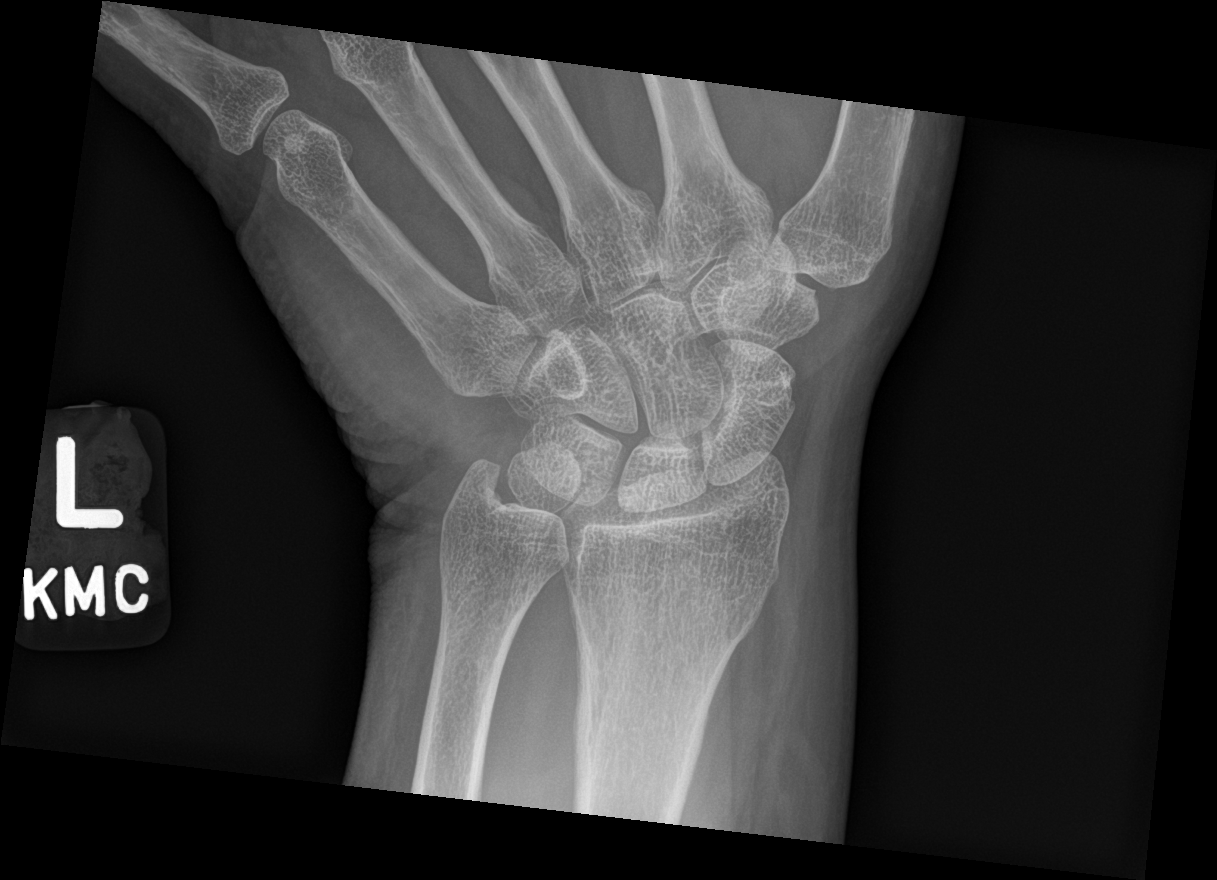

[4 of 4 positions shown; findings below may reference images not displayed]

FINDINGS: No acute fracture or dislocation. Normal radiocarpal and distal
radioulnar articulations maintained. Osseous mineralization normal.
No soft tissue abnormality.
IMPRESSION: No acute osseous abnormality about the left wrist.
